# Patient Record
Sex: Female | Born: 1945 | Race: White | Hispanic: No | Marital: Married | Smoking: Never smoker
Health system: Southern US, Community
[De-identification: ages and names within clinical notes are randomized; demographics above are authoritative.]

## PROBLEM LIST (undated history)

## (undated) DIAGNOSIS — R002 Palpitations: Secondary | ICD-10-CM

## (undated) DIAGNOSIS — R51 Headache: Secondary | ICD-10-CM

## (undated) DIAGNOSIS — R011 Cardiac murmur, unspecified: Secondary | ICD-10-CM

## (undated) DIAGNOSIS — I10 Essential (primary) hypertension: Secondary | ICD-10-CM

## (undated) DIAGNOSIS — I779 Disorder of arteries and arterioles, unspecified: Secondary | ICD-10-CM

## (undated) DIAGNOSIS — Z9289 Personal history of other medical treatment: Secondary | ICD-10-CM

## (undated) DIAGNOSIS — K449 Diaphragmatic hernia without obstruction or gangrene: Secondary | ICD-10-CM

## (undated) DIAGNOSIS — I739 Peripheral vascular disease, unspecified: Secondary | ICD-10-CM

## (undated) DIAGNOSIS — I2510 Atherosclerotic heart disease of native coronary artery without angina pectoris: Secondary | ICD-10-CM

## (undated) DIAGNOSIS — I493 Ventricular premature depolarization: Secondary | ICD-10-CM

## (undated) DIAGNOSIS — I209 Angina pectoris, unspecified: Secondary | ICD-10-CM

## (undated) DIAGNOSIS — E785 Hyperlipidemia, unspecified: Secondary | ICD-10-CM

## (undated) DIAGNOSIS — N390 Urinary tract infection, site not specified: Secondary | ICD-10-CM

## (undated) DIAGNOSIS — E039 Hypothyroidism, unspecified: Secondary | ICD-10-CM

## (undated) DIAGNOSIS — R519 Headache, unspecified: Secondary | ICD-10-CM

## (undated) DIAGNOSIS — R0602 Shortness of breath: Secondary | ICD-10-CM

## (undated) DIAGNOSIS — M810 Age-related osteoporosis without current pathological fracture: Secondary | ICD-10-CM

## (undated) DIAGNOSIS — R251 Tremor, unspecified: Secondary | ICD-10-CM

## (undated) HISTORY — PX: BREAST CYST ASPIRATION: SHX578

## (undated) HISTORY — PX: ECTOPIC PREGNANCY SURGERY: SHX613

## (undated) HISTORY — DX: Atherosclerotic heart disease of native coronary artery without angina pectoris: I25.10

## (undated) HISTORY — DX: Diaphragmatic hernia without obstruction or gangrene: K44.9

## (undated) HISTORY — DX: Hyperlipidemia, unspecified: E78.5

## (undated) HISTORY — DX: Palpitations: R00.2

## (undated) HISTORY — PX: TOTAL ABDOMINAL HYSTERECTOMY: SHX209

## (undated) HISTORY — PX: CARDIAC CATHETERIZATION: SHX172

## (undated) HISTORY — DX: Essential (primary) hypertension: I10

## (undated) HISTORY — DX: Peripheral vascular disease, unspecified: I73.9

## (undated) HISTORY — DX: Ventricular premature depolarization: I49.3

## (undated) HISTORY — PX: APPENDECTOMY: SHX54

## (undated) HISTORY — DX: Hypothyroidism, unspecified: E03.9

## (undated) HISTORY — PX: TONSILLECTOMY: SUR1361

## (undated) HISTORY — DX: Disorder of arteries and arterioles, unspecified: I77.9

---

## 1963-07-27 HISTORY — PX: LAPAROTOMY: SHX154

## 1963-07-27 HISTORY — PX: BREAST CYST EXCISION: SHX579

## 1979-03-27 HISTORY — PX: BREAST LUMPECTOMY: SHX2

## 1998-02-27 ENCOUNTER — Ambulatory Visit (HOSPITAL_COMMUNITY): Admission: RE | Admit: 1998-02-27 | Discharge: 1998-02-27 | Payer: Self-pay | Admitting: *Deleted

## 1999-01-21 ENCOUNTER — Encounter: Payer: Self-pay | Admitting: *Deleted

## 1999-01-21 ENCOUNTER — Ambulatory Visit (HOSPITAL_COMMUNITY): Admission: RE | Admit: 1999-01-21 | Discharge: 1999-01-21 | Payer: Self-pay | Admitting: *Deleted

## 2000-02-16 ENCOUNTER — Encounter: Payer: Self-pay | Admitting: Family Medicine

## 2000-02-16 ENCOUNTER — Ambulatory Visit (HOSPITAL_COMMUNITY): Admission: RE | Admit: 2000-02-16 | Discharge: 2000-02-16 | Payer: Self-pay | Admitting: Family Medicine

## 2000-03-02 ENCOUNTER — Other Ambulatory Visit: Admission: RE | Admit: 2000-03-02 | Discharge: 2000-03-02 | Payer: Self-pay | Admitting: Gynecology

## 2001-02-20 ENCOUNTER — Encounter: Payer: Self-pay | Admitting: Gynecology

## 2001-02-20 ENCOUNTER — Ambulatory Visit (HOSPITAL_COMMUNITY): Admission: RE | Admit: 2001-02-20 | Discharge: 2001-02-20 | Payer: Self-pay | Admitting: Gynecology

## 2001-03-05 ENCOUNTER — Encounter: Payer: Self-pay | Admitting: Emergency Medicine

## 2001-03-05 ENCOUNTER — Emergency Department (HOSPITAL_COMMUNITY): Admission: EM | Admit: 2001-03-05 | Discharge: 2001-03-05 | Payer: Self-pay | Admitting: Emergency Medicine

## 2001-03-08 ENCOUNTER — Other Ambulatory Visit: Admission: RE | Admit: 2001-03-08 | Discharge: 2001-03-08 | Payer: Self-pay | Admitting: Gynecology

## 2002-03-13 ENCOUNTER — Other Ambulatory Visit: Admission: RE | Admit: 2002-03-13 | Discharge: 2002-03-13 | Payer: Self-pay | Admitting: Gynecology

## 2003-02-23 ENCOUNTER — Encounter: Payer: Self-pay | Admitting: Emergency Medicine

## 2003-02-23 ENCOUNTER — Inpatient Hospital Stay (HOSPITAL_COMMUNITY): Admission: EM | Admit: 2003-02-23 | Discharge: 2003-02-24 | Payer: Self-pay | Admitting: Emergency Medicine

## 2003-03-29 ENCOUNTER — Other Ambulatory Visit: Admission: RE | Admit: 2003-03-29 | Discharge: 2003-03-29 | Payer: Self-pay | Admitting: Gynecology

## 2004-05-11 ENCOUNTER — Other Ambulatory Visit: Admission: RE | Admit: 2004-05-11 | Discharge: 2004-05-11 | Payer: Self-pay | Admitting: Gynecology

## 2004-09-20 ENCOUNTER — Emergency Department (HOSPITAL_COMMUNITY): Admission: EM | Admit: 2004-09-20 | Discharge: 2004-09-20 | Payer: Self-pay | Admitting: Emergency Medicine

## 2004-11-17 ENCOUNTER — Ambulatory Visit (HOSPITAL_COMMUNITY): Admission: RE | Admit: 2004-11-17 | Discharge: 2004-11-17 | Payer: Self-pay | Admitting: Otolaryngology

## 2008-04-04 ENCOUNTER — Ambulatory Visit: Payer: Self-pay | Admitting: Cardiology

## 2008-04-04 ENCOUNTER — Observation Stay (HOSPITAL_COMMUNITY): Admission: EM | Admit: 2008-04-04 | Discharge: 2008-04-05 | Payer: Self-pay | Admitting: Emergency Medicine

## 2008-04-05 ENCOUNTER — Encounter (INDEPENDENT_AMBULATORY_CARE_PROVIDER_SITE_OTHER): Payer: Self-pay | Admitting: Internal Medicine

## 2008-04-08 ENCOUNTER — Ambulatory Visit: Payer: Self-pay | Admitting: Cardiology

## 2008-04-11 ENCOUNTER — Ambulatory Visit: Payer: Self-pay

## 2008-04-17 ENCOUNTER — Ambulatory Visit: Payer: Self-pay | Admitting: Cardiology

## 2010-04-18 ENCOUNTER — Emergency Department (HOSPITAL_BASED_OUTPATIENT_CLINIC_OR_DEPARTMENT_OTHER): Admission: EM | Admit: 2010-04-18 | Discharge: 2010-04-18 | Payer: Self-pay | Admitting: Emergency Medicine

## 2010-07-26 HISTORY — PX: CORONARY ANGIOPLASTY WITH STENT PLACEMENT: SHX49

## 2010-08-02 ENCOUNTER — Emergency Department (HOSPITAL_BASED_OUTPATIENT_CLINIC_OR_DEPARTMENT_OTHER)
Admission: EM | Admit: 2010-08-02 | Discharge: 2010-08-02 | Payer: Self-pay | Source: Home / Self Care | Admitting: Emergency Medicine

## 2010-08-03 ENCOUNTER — Inpatient Hospital Stay (HOSPITAL_COMMUNITY)
Admission: EM | Admit: 2010-08-03 | Discharge: 2010-08-05 | Payer: Self-pay | Source: Home / Self Care | Attending: Cardiology | Admitting: Cardiology

## 2010-08-03 ENCOUNTER — Encounter (INDEPENDENT_AMBULATORY_CARE_PROVIDER_SITE_OTHER): Payer: Self-pay | Admitting: Internal Medicine

## 2010-08-10 ENCOUNTER — Encounter: Payer: Self-pay | Admitting: Cardiology

## 2010-08-10 LAB — DIFFERENTIAL
Basophils Absolute: 0 10*3/uL (ref 0.0–0.1)
Basophils Absolute: 0.1 10*3/uL (ref 0.0–0.1)
Basophils Relative: 0 % (ref 0–1)
Basophils Relative: 0 % (ref 0–1)
Eosinophils Absolute: 0.3 10*3/uL (ref 0.0–0.7)
Eosinophils Absolute: 0.2 10*3/uL (ref 0.0–0.7)
Eosinophils Relative: 3 % (ref 0–5)
Eosinophils Relative: 2 % (ref 0–5)
Lymphocytes Relative: 42 % (ref 12–46)
Lymphocytes Relative: 22 % (ref 12–46)
Lymphs Abs: 4.6 10*3/uL — ABNORMAL HIGH (ref 0.7–4.0)
Lymphs Abs: 2.8 10*3/uL (ref 0.7–4.0)
Monocytes Absolute: 0.7 10*3/uL (ref 0.1–1.0)
Monocytes Absolute: 0.7 10*3/uL (ref 0.1–1.0)
Monocytes Relative: 6 % (ref 3–12)
Monocytes Relative: 6 % (ref 3–12)
Neutro Abs: 5.5 10*3/uL (ref 1.7–7.7)
Neutro Abs: 8.9 10*3/uL — ABNORMAL HIGH (ref 1.7–7.7)
Neutrophils Relative %: 49 % (ref 43–77)
Neutrophils Relative %: 71 % (ref 43–77)

## 2010-08-10 LAB — CBC
HCT: 41.3 % (ref 36.0–46.0)
HCT: 36.3 % (ref 36.0–46.0)
HCT: 34.1 % — ABNORMAL LOW (ref 36.0–46.0)
Hemoglobin: 13.7 g/dL (ref 12.0–15.0)
Hemoglobin: 11.8 g/dL — ABNORMAL LOW (ref 12.0–15.0)
Hemoglobin: 10.8 g/dL — ABNORMAL LOW (ref 12.0–15.0)
MCH: 28.1 pg (ref 26.0–34.0)
MCH: 29.1 pg (ref 26.0–34.0)
MCH: 28.1 pg (ref 26.0–34.0)
MCHC: 33.2 g/dL (ref 30.0–36.0)
MCHC: 32.5 g/dL (ref 30.0–36.0)
MCHC: 31.7 g/dL (ref 30.0–36.0)
MCV: 84.8 fL (ref 78.0–100.0)
MCV: 89.6 fL (ref 78.0–100.0)
MCV: 88.8 fL (ref 78.0–100.0)
Platelets: 309 10*3/uL (ref 150–400)
Platelets: 260 10*3/uL (ref 150–400)
Platelets: 211 10*3/uL (ref 150–400)
RBC: 4.87 MIL/uL (ref 3.87–5.11)
RBC: 4.05 MIL/uL (ref 3.87–5.11)
RBC: 3.84 MIL/uL — ABNORMAL LOW (ref 3.87–5.11)
RDW: 13.1 % (ref 11.5–15.5)
RDW: 13.3 % (ref 11.5–15.5)
RDW: 13 % (ref 11.5–15.5)
WBC: 11.1 10*3/uL — ABNORMAL HIGH (ref 4.0–10.5)
WBC: 12.6 10*3/uL — ABNORMAL HIGH (ref 4.0–10.5)
WBC: 8.9 10*3/uL (ref 4.0–10.5)

## 2010-08-10 LAB — BASIC METABOLIC PANEL
BUN: 8 mg/dL (ref 6–23)
BUN: 6 mg/dL (ref 6–23)
CO2: 27 mEq/L (ref 19–32)
CO2: 28 mEq/L (ref 19–32)
Calcium: 8.7 mg/dL (ref 8.4–10.5)
Calcium: 8.8 mg/dL (ref 8.4–10.5)
Chloride: 101 mEq/L (ref 96–112)
Chloride: 106 mEq/L (ref 96–112)
Creatinine, Ser: 0.74 mg/dL (ref 0.4–1.2)
Creatinine, Ser: 0.77 mg/dL (ref 0.4–1.2)
GFR calc Af Amer: >60 mL/min (ref >60)
GFR calc Af Amer: >60 mL/min (ref >60)
GFR calc non Af Amer: >60 mL/min (ref >60)
GFR calc non Af Amer: >60 mL/min (ref >60)
Glucose, Bld: 90 mg/dL (ref 70–99)
Glucose, Bld: 89 mg/dL (ref 70–99)
Potassium: 3.5 mEq/L (ref 3.5–5.1)
Potassium: 3.5 mEq/L (ref 3.5–5.1)
Sodium: 139 mEq/L (ref 135–145)
Sodium: 142 mEq/L (ref 135–145)

## 2010-08-10 LAB — CARDIAC PANEL(CRET KIN+CKTOT+MB+TROPI)
CK, MB: 0.9 ng/mL (ref 0.3–4.0)
CK, MB: 1 ng/mL (ref 0.3–4.0)
CK, MB: 1.1 ng/mL (ref 0.3–4.0)
CK, MB: 1.1 ng/mL (ref 0.3–4.0)
Relative Index: INVALID (ref 0.0–2.5)
Relative Index: INVALID (ref 0.0–2.5)
Relative Index: 1 (ref 0.0–2.5)
Relative Index: 1 (ref 0.0–2.5)
Total CK: 54 U/L (ref 7–177)
Total CK: 72 U/L (ref 7–177)
Total CK: 108 U/L (ref 7–177)
Total CK: 107 U/L (ref 7–177)
Troponin I: 0.02 ng/mL (ref 0.00–0.06)
Troponin I: 0.03 ng/mL (ref 0.00–0.06)
Troponin I: 0.01 ng/mL (ref 0.00–0.06)
Troponin I: 0.02 ng/mL (ref 0.00–0.06)

## 2010-08-10 LAB — COMPREHENSIVE METABOLIC PANEL
ALT: 39 U/L — ABNORMAL HIGH (ref 0–35)
AST: 45 U/L — ABNORMAL HIGH (ref 0–37)
Albumin: 4.9 g/dL (ref 3.5–5.2)
Alkaline Phosphatase: 136 U/L — ABNORMAL HIGH (ref 39–117)
BUN: 8 mg/dL (ref 6–23)
CO2: 26 mEq/L (ref 19–32)
Calcium: 9.9 mg/dL (ref 8.4–10.5)
Chloride: 101 mEq/L (ref 96–112)
Creatinine, Ser: 0.7 mg/dL (ref 0.4–1.2)
GFR calc Af Amer: >60 mL/min (ref >60)
GFR calc non Af Amer: >60 mL/min (ref >60)
Glucose, Bld: 120 mg/dL — ABNORMAL HIGH (ref 70–99)
Potassium: 3.7 mEq/L (ref 3.5–5.1)
Sodium: 143 mEq/L (ref 135–145)
Total Bilirubin: 1 mg/dL (ref 0.3–1.2)
Total Protein: 8.5 g/dL — ABNORMAL HIGH (ref 6.0–8.3)

## 2010-08-10 LAB — LIPASE, BLOOD: Lipase: 155 U/L (ref 23–300)

## 2010-08-10 LAB — MAGNESIUM: Magnesium: 2.1 mg/dL (ref 1.5–2.5)

## 2010-08-10 LAB — POCT CARDIAC MARKERS
CKMB, poc: <1 ng/mL — ABNORMAL LOW (ref 1.0–8.0)
Myoglobin, poc: 58.1 ng/mL (ref 12–200)
Troponin i, poc: <0.05 ng/mL (ref 0.00–0.09)

## 2010-08-10 LAB — LIPID PANEL
Cholesterol: 303 mg/dL — ABNORMAL HIGH (ref 0–200)
HDL: 43 mg/dL (ref >39)
LDL Cholesterol: 216 mg/dL — ABNORMAL HIGH (ref 0–99)
Total CHOL/HDL Ratio: 7 RATIO
Triglycerides: 220 mg/dL — ABNORMAL HIGH (ref <150)
VLDL: 44 mg/dL — ABNORMAL HIGH (ref 0–40)

## 2010-08-10 LAB — PROTIME-INR
INR: 1.01 (ref 0.00–1.49)
Prothrombin Time: 13.5 seconds (ref 11.6–15.2)

## 2010-08-10 LAB — HEMOGLOBIN A1C
Hgb A1c MFr Bld: 5.6 % (ref <5.7)
Mean Plasma Glucose: 114 mg/dL (ref <117)

## 2010-08-10 LAB — APTT: aPTT: 30 seconds (ref 24–37)

## 2010-08-12 ENCOUNTER — Encounter: Payer: Self-pay | Admitting: Cardiology

## 2010-08-12 ENCOUNTER — Telehealth: Payer: Self-pay | Admitting: Cardiology

## 2010-08-12 ENCOUNTER — Ambulatory Visit
Admission: RE | Admit: 2010-08-12 | Discharge: 2010-08-12 | Payer: Self-pay | Source: Home / Self Care | Attending: Cardiology | Admitting: Cardiology

## 2010-08-14 DIAGNOSIS — E039 Hypothyroidism, unspecified: Secondary | ICD-10-CM | POA: Insufficient documentation

## 2010-08-14 DIAGNOSIS — I10 Essential (primary) hypertension: Secondary | ICD-10-CM | POA: Insufficient documentation

## 2010-08-14 DIAGNOSIS — I4949 Other premature depolarization: Secondary | ICD-10-CM | POA: Insufficient documentation

## 2010-08-14 DIAGNOSIS — E785 Hyperlipidemia, unspecified: Secondary | ICD-10-CM | POA: Insufficient documentation

## 2010-08-14 DIAGNOSIS — I2510 Atherosclerotic heart disease of native coronary artery without angina pectoris: Secondary | ICD-10-CM | POA: Insufficient documentation

## 2010-08-14 DIAGNOSIS — K449 Diaphragmatic hernia without obstruction or gangrene: Secondary | ICD-10-CM | POA: Insufficient documentation

## 2010-08-17 ENCOUNTER — Ambulatory Visit
Admission: RE | Admit: 2010-08-17 | Discharge: 2010-08-17 | Payer: Self-pay | Source: Home / Self Care | Attending: Cardiology | Admitting: Cardiology

## 2010-08-17 ENCOUNTER — Encounter: Payer: Self-pay | Admitting: Cardiology

## 2010-08-19 ENCOUNTER — Encounter: Payer: Self-pay | Admitting: Cardiology

## 2010-08-22 ENCOUNTER — Inpatient Hospital Stay (HOSPITAL_COMMUNITY)
Admission: EM | Admit: 2010-08-22 | Discharge: 2010-08-25 | Disposition: A | Discharge status: Home / Self Care | Payer: Self-pay | Source: Home / Self Care | Attending: Cardiology | Admitting: Cardiology

## 2010-08-22 LAB — POCT I-STAT, CHEM 8
BUN: 10 mg/dL (ref 6–23)
Calcium, Ion: 1.09 mmol/L — ABNORMAL LOW (ref 1.12–1.32)
Chloride: 103 mEq/L (ref 96–112)
Creatinine, Ser: 0.8 mg/dL (ref 0.4–1.2)
Glucose, Bld: 115 mg/dL — ABNORMAL HIGH (ref 70–99)
HCT: 36 % (ref 36.0–46.0)
Hemoglobin: 12.2 g/dL (ref 12.0–15.0)
Potassium: 4 mEq/L (ref 3.5–5.1)
Sodium: 139 mEq/L (ref 135–145)
TCO2: 30 mmol/L (ref 0–100)

## 2010-08-22 LAB — DIFFERENTIAL
Basophils Absolute: 0 10*3/uL (ref 0.0–0.1)
Basophils Relative: 0 % (ref 0–1)
Eosinophils Absolute: 0.3 10*3/uL (ref 0.0–0.7)
Eosinophils Relative: 3 % (ref 0–5)
Lymphocytes Relative: 20 % (ref 12–46)
Lymphs Abs: 2.2 10*3/uL (ref 0.7–4.0)
Monocytes Absolute: 0.7 10*3/uL (ref 0.1–1.0)
Monocytes Relative: 7 % (ref 3–12)
Neutro Abs: 7.4 10*3/uL (ref 1.7–7.7)
Neutrophils Relative %: 70 % (ref 43–77)

## 2010-08-22 LAB — CBC
HCT: 36.8 % (ref 36.0–46.0)
Hemoglobin: 12.1 g/dL (ref 12.0–15.0)
MCH: 28.8 pg (ref 26.0–34.0)
MCHC: 32.9 g/dL (ref 30.0–36.0)
MCV: 87.6 fL (ref 78.0–100.0)
Platelets: 305 10*3/uL (ref 150–400)
RBC: 4.2 MIL/uL (ref 3.87–5.11)
RDW: 13.1 % (ref 11.5–15.5)
WBC: 10.6 10*3/uL — ABNORMAL HIGH (ref 4.0–10.5)

## 2010-08-22 LAB — POCT CARDIAC MARKERS
CKMB, poc: <1 ng/mL — ABNORMAL LOW (ref 1.0–8.0)
Myoglobin, poc: 60 ng/mL (ref 12–200)
Troponin i, poc: <0.05 ng/mL (ref 0.00–0.09)

## 2010-08-22 LAB — BRAIN NATRIURETIC PEPTIDE: Pro B Natriuretic peptide (BNP): 123 pg/mL — ABNORMAL HIGH (ref 0.0–100.0)

## 2010-08-22 LAB — PROTIME-INR: INR: 0.93 (ref 0.00–1.49)

## 2010-08-23 LAB — LIPID PANEL
Cholesterol: 254 mg/dL — ABNORMAL HIGH (ref 0–200)
HDL: 44 mg/dL (ref >39)
LDL Cholesterol: 161 mg/dL — ABNORMAL HIGH (ref 0–99)
Total CHOL/HDL Ratio: 5.8 RATIO
Triglycerides: 245 mg/dL — ABNORMAL HIGH (ref <150)
VLDL: 49 mg/dL — ABNORMAL HIGH (ref 0–40)

## 2010-08-23 LAB — CARDIAC PANEL(CRET KIN+CKTOT+MB+TROPI)
CK, MB: 0.6 ng/mL (ref 0.3–4.0)
Relative Index: INVALID (ref 0.0–2.5)
Relative Index: INVALID (ref 0.0–2.5)
Total CK: 34 U/L (ref 7–177)
Troponin I: 0.01 ng/mL (ref 0.00–0.06)

## 2010-08-24 LAB — BASIC METABOLIC PANEL
BUN: 10 mg/dL (ref 6–23)
Chloride: 101 mEq/L (ref 96–112)
Creatinine, Ser: 0.74 mg/dL (ref 0.4–1.2)
GFR calc Af Amer: >60 mL/min (ref >60)
GFR calc non Af Amer: >60 mL/min (ref >60)
Potassium: 3.5 mEq/L (ref 3.5–5.1)

## 2010-08-24 LAB — CBC
MCV: 88.3 fL (ref 78.0–100.0)
Platelets: 282 10*3/uL (ref 150–400)
RBC: 4.46 MIL/uL (ref 3.87–5.11)
RDW: 13.2 % (ref 11.5–15.5)
WBC: 9.4 10*3/uL (ref 4.0–10.5)

## 2010-08-24 LAB — PROTIME-INR
INR: 0.99 (ref 0.00–1.49)
Prothrombin Time: 13.3 seconds (ref 11.6–15.2)

## 2010-08-24 LAB — HEMOGLOBIN A1C: Mean Plasma Glucose: 114 mg/dL (ref <117)

## 2010-08-25 LAB — POCT I-STAT 3, ART BLOOD GAS (G3+)
Bicarbonate: 37.4 mEq/L — ABNORMAL HIGH (ref 20.0–24.0)
TCO2: 39 mmol/L (ref 0–100)
pH, Arterial: 7.357 (ref 7.350–7.400)
pO2, Arterial: 76 mmHg — ABNORMAL LOW (ref 80.0–100.0)

## 2010-08-25 LAB — CBC
HCT: 38.6 % (ref 36.0–46.0)
MCHC: 31.9 g/dL (ref 30.0–36.0)
MCV: 88.7 fL (ref 78.0–100.0)
Platelets: 270 10*3/uL (ref 150–400)
RDW: 13.2 % (ref 11.5–15.5)

## 2010-08-25 LAB — BASIC METABOLIC PANEL
BUN: 11 mg/dL (ref 6–23)
Calcium: 9.2 mg/dL (ref 8.4–10.5)
GFR calc non Af Amer: >60 mL/min (ref >60)
Glucose, Bld: 91 mg/dL (ref 70–99)

## 2010-08-26 NOTE — H&P (Signed)
[name], [name]               ACCOUNT NO.:  [id number]  MEDICAL RECORD NO.:  [id number]          PATIENT TYPE:  INP  LOCATION:  4739                         FACILITY:  [facility]  PHYSICIAN:  Cassell Clement, M.D. DATE OF BIRTH:  11-Apr-1946  DATE OF ADMISSION:  08/22/2010 DATE OF DISCHARGE:                             HISTORY & PHYSICAL   PRIMARY CARDIOLOGIST:  Maisie Fus C. Wall, MD, [facility]  CHIEF COMPLAINT:  Chest pain.  HISTORY:  This is a pleasant 65 year old married Caucasian female who is admitted to the emergency room on August 22, 2010, with recurrent chest pain.  She was recently hospitalized from August 03, 2010, through August 05, 2010, with chest pain.  She underwent cardiac catheterization on August 04, 2010, and had findings of a 50% mid vessel stenosis in a branch of the right coronary artery, the LAD system had only luminal irregularities, and the left circumflex consisted mainly of a large obtuse marginal, which had a 95% proximal stenosis. The cardiac catheterization was done by Dr. Shirlee Latch.  The patient went on to have percutaneous intervention by Dr. Verne Carrow.  She underwent placement of a drug-eluting stent in the first obtuse marginal branch of the circumflex artery.  She tolerated the procedure well and was discharged the following day.  DISCHARGE MEDICATIONS: 1. Pravastatin 40 mg one daily. 2. Aspirin 81 mg one daily. 3. Metoprolol 12.5 mg twice a day. 4. Nitroglycerin sublingual tablet p.r.n. 5. Prasugrel 10 mg one daily. 6. Caltrate 1 tablet twice daily. 7. Diovan HCT 160/12.5 one daily. 8. Multivitamin one daily. 9. Nexium 40 mg one daily. 10.Tylenol Extra Strength 500 mg 2 every 6 hours p.r.n. for headache. 11.Unithroid 50 mcg one daily. 12.Vitamin D3 over the counter 1 daily.  She was seen in followup by Dr. Juanito Doom on August 17, 2010, and was felt to be doing well.  Early this morning, the patient awoke with substernal chest  discomfort. She took a total of 3 nitroglycerin with eventual relief.  She did well until the afternoon of admission when she developed slight chest discomfort associated with weakness, nausea, and sweating.  The family called EMS.  EMS gave her an additional sublingual nitroglycerin and an additional aspirin, and the patient has had no further discomfort but is anxious.  On presentation in the emergency room, her PO2 on room air was slightly low at 76.  Her chest x-ray was normal and her EKG was nonacute.  PAST MEDICAL HISTORY:  Positive for hypertension, hyperlipidemia, hiatal hernia, history of PVCs, hypothyroidism, and osteoporosis.  SURGICAL HISTORY:  Status post hysterectomy, status post tonsillectomy, status post appendectomy.  The patient is allergic to CODEINE, FLAGYL, PENICILLIN, and has been intolerant of STATINS, although at the present time, we are trying her on a low dose of pravastatin.  The family history reveals that her brother had a heart attack at age 39.  The social history reveals that she does not use alcohol or tobacco.  REVIEW OF SYSTEMS:  All other systems are negative in detail.  PHYSICAL EXAMINATION:  VITAL SIGNS:  Her blood pressure is 162/80, pulse is 70 and regular, PO2 is  76, respirations are normal, and she is afebrile. GENERAL APPEARANCE:  An anxious elderly Caucasian female in no acute distress. HEAD AND NECK:  Jugular venous pressure normal.  Carotids normal. Thyroid normal.  Pupils equal and reactive.  Extraocular movements are full. NECK:  Supple.  There is no lymphadenopathy.  There is no thyroid enlargement. CHEST:  Clear to percussion and auscultation. HEART:  A grade 2/6 systolic ejection murmur at the base. ABDOMEN:  Soft and nontender.  The liver and spleen are not enlarged. EXTREMITIES:  Mild resolving ecchymosis of the right groin.  There is no evidence of phlebitis or edema.  Pedal pulses are good. NEUROLOGIC:   Physiologic.  Her white count is 10,600, hemoglobin is 12.1, she has 70% neutrophils, platelet count 305,000.  Sodium 139, potassium 4.0, BUN 10, creatinine 0.8, blood sugar 115.  Protime 12.7.  Arterial gases show a pH of 7.357, PCO2 of 66, PO2 of 76.  Her CK-MB is less than 1.0.  Troponin is less than 0.05.  Her chest x-ray shows clear lung fields.  Cardiomegaly is stable.  There is no acute bony abnormality.  EKG is nonacute and she is in normal sinus rhythm.  IMPRESSION: 1. Recurrent chest pain, possible angina pectoris, rule out other     causes. 2. Status post percutaneous transluminal coronary angioplasty with     drug-eluting stent of the first obtuse marginal on August 04, 2010, by Dr. Clifton James. 3. History of hypertensive cardiovascular disease. 4. History of hyperlipidemia. 5. History of hypothyroidism.  DISPOSITION:  She is being admitted to telemetry.  We will cycle enzymes.  We will continue her on her Nexium.  We will increase her dose of beta-blocker to metoprolol 25 mg one b.i.d.  We will check a D-dimer tonight and if elevated, we will consider CT angiogram of the chest.          ______________________________ Cassell Clement, M.D.     TB/MEDQ  D:  08/22/2010  T:  08/23/2010  Job:  [id number]  Electronically Signed by Cassell Clement M.D. on 08/26/2010 12:56:48 PM

## 2010-08-26 NOTE — Discharge Summary (Addendum)
[name], [name]               ACCOUNT NO.:  [id number]  MEDICAL RECORD NO.:  [id number]          PATIENT TYPE:  INP  LOCATION:  4739                         FACILITY:  [facility]  PHYSICIAN:  Precious Gilchrest C. Mateusz Neilan, MD, FACCDATE OF BIRTH:  08-20-1945  DATE OF ADMISSION:  08/22/2010 DATE OF DISCHARGE:  08/25/2010                              DISCHARGE SUMMARY   DISCHARGE DIAGNOSES: 1. Chest pain, felt noncardiac.     a.     Relook catheterization, August 24, 2010, demonstrating      widely patent stent with no other obstructive disease. 2. Coronary artery disease, status post percutaneous coronary     intervention with placement of drug-eluting stent to the first     obtuse marginal on August 04, 2010. 3. Hypertension. 4. Hyperlipidemia with total cholesterol of 254, triglycerides 245,     HDL 44, LDL 161 on August 23, 2010, changed to Crestor.  The     patient will have lipids and LFTs checked in approximately 6 weeks. 5. Hypothyroidism. 6. Hiatal hernia. 7. History of premature ventricular contractions.  HOSPITAL COURSE:  [name] is a 65 year old female with a past medical history of coronary artery disease, status post stent placement to the OM on August 04, 2010, hypertension, hyperlipidemia, and hypothyroidism who awoke with substernal chest discomfort.  She took three nitroglycerin with eventual relief.  She developed associated weakness, nausea, and sweating.  Family called EMS, and she was given sublingual nitro and additional aspirin.  On arrival to the ER, she was found to be anxious.  EKG was nonacute and chest x-ray was normal.  She was admitted to the hospital given her history of recent stent placement and cardiac enzymes were cycled, which were negative.  She was continued on beta blockade and PPI.  D-dimer was checked which was negative.  She went for relook catheterization on August 24, 2010, which showed that her stent was widely patent and Dr. Eden Emms felt that  her chest pain was noncardiac in etiology.  Her EF was 65%.  She was observed overnight and did well. Dr. Daleen Squibb has seen and examined her this morning and feels she is stable for discharge.  He would like to switch her pravastatin to Crestor given her elevated cholesterol and to continue her beta blockade as well.  Of note, the pharmacist has recommended given the patient's weight of 50 kg to decrease the dose of prasugrel to 5 mg.  I have discussed this with Dr. Daleen Squibb and Dr. Verne Carrow, interventionalist.  Dr. Clifton James recommended to continue the patient on 10 mg daily given the lack of evidence of 5 mg as effective in the setting of her recent stent placement.  There is no evidence of bleeding at this time and her hemoglobin is stable.  The patient has been educated on this decision well.  DISCHARGE LABORATORY DATA:  WBC 9.1, hemoglobin 12.3, hematocrit 38.6, platelet count 270.  Sodium 140, potassium 3.4 which has been repleted with 40 mEq of potassium, chloride 102, CO2 of 27, glucose 91, BUN 7, creatinine 0.77.  Hemoglobin A1c of 5.6.  Cardiac enzymes were negative. BNP 118.  Total cholesterol as above.  STUDIES: 1. Chest x-ray, August 22, 2010, showed stable mild cardiomegaly     without active lung disease. 2. Cardiac catheterization, August 24, 2010, please see that report     for full details as well as HPI for summary.  DISCHARGE MEDICATIONS: 1. Crestor 20 mg nightly. 2. Aspirin 81 mg daily. 3. Calcium carbonate/vitamin D over the counter 1 tablet twice daily. 4. Diovan HCT 160/12.5 mg daily. 5. Effient 10 mg daily. 6. Metoprolol tartrate 25 mg half tablet b.i.d. 7. Multivitamin 1 tablet daily. 8. Nexium 40 mg every morning. 9. Nitroglycerin sublingual 0.4 mg every 5 minutes as needed up to 3     doses for chest pain. 10.Tylenol Extra Strength 500 mg 2 tablets q.6 h. p.r.n. 11.Unithroid 50 mcg daily. 12.Vitamin D3 1 tablet daily.  Please note that the  patient's pravastatin was changed to Crestor this admission for her unsatisfactory lipid profile.  There is a history of myalgias with statin, so we will monitor closely while on Crestor and follow up her lipid panel as well as LFTs.  DISPOSITION:  [name] was discharged in stable condition home.  She is not to lift or participate in sexual activity for 1 week or drive for 2 days.  She is to follow a low-sodium heart-healthy diet; and if she notices any pain, swelling, bleeding, or pus at her cath site, she is to call or return.  She will follow up with Dr. Vern Claude office for labs including lipid panel and LFTs on September 28, 2010, at 8:55 a.m. as previously scheduled.  The office will call her with reminders and to schedule her 89-month followup appointment as Dr. Daleen Squibb does not feel that she needs to be seen in the interim.  She is to return to the ER if she develops any recurrent chest pain, shortness of breath, or any other symptoms concerning to her.  Also please see above for Effient discussion.  DURATION OF DISCHARGE ENCOUNTER:  Greater than 30 minutes including physician and PA time.     Dayna Dunn, P.A.C.   ______________________________ Jesse Sans Daleen Squibb, MD, [facility]    DD/MEDQ  D:  08/25/2010  T:  08/26/2010  Job:  [id number]  Electronically Signed by Ronie Spies  on 08/26/2010 09:32:34 PM Electronically Signed by Valera Castle MD [facility] on 09/09/2010 09:36:11 AM

## 2010-08-27 NOTE — Procedures (Signed)
  NAMEGYSELLE, Nicole               ACCOUNT NO.:  [id number]  MEDICAL RECORD NO.:  [id number]           PATIENT TYPE:  LOCATION:                                 FACILITY:  PHYSICIAN:  Malori Myers C. Eden Emms, MD, FACCDATE OF BIRTH:  [birth year]  DATE OF PROCEDURE: DATE OF DISCHARGE:                           CARDIAC CATHETERIZATION   This is a 65 year old patient who had a recent stent to the first obtuse marginal branch, recurrent admissions for chest pain.  A catheterization was done from the right radial artery.  The patient received 25 mcg of fentanyl, 3 mg of Versed, 3 mg of verapamil, and 200 mcg of nitro.  Standard JL-3.5 and JR-4 catheters were used to engage the coronaries.  Left main coronary artery was normal.  Left anterior descending artery was normal.  First, second, and third diagonal branches were small and normal.  Circumflex coronary artery was normal.  First obtuse marginal branch had a widely patent stent in the midvessel. There was 30-40% disease distal to the stent.  Right coronary artery was dominant.  There were 30% multiple discrete lesions in the mid and distal RCA and PDA was normal.  First posterolateral branch had a 50% ostial lesion, but it was a very small vessel, less than 2 mm.  RAO ventriculography:  RAO ventriculography was normal with EF 65%. There was no gradient across the aortic valve and no MR.  Aortic pressure is 142/70.  LV pressure is 140/5.  IMPRESSION:  The patient's stent is widely patent.  Her recurrent chest pain would appear to be noncardiac.  She tolerated the procedure well. At the end of the case, a TR band was applied with good hemostasis.  Since it is late in the day, she will likely stay overnight and be discharged in the morning so long as her hand looks good.    Noralyn Pick. Eden Emms, MD, [facility]    PCN/MEDQ  D:  08/24/2010  T:  08/25/2010  Job:  [id number]  Electronically Signed by Charlton Haws MD [facility] on 08/27/2010 10:38:44  PM

## 2010-08-27 NOTE — Letter (Signed)
Summary: Cardiac Rehab Program  Cardiac Rehab Program   Imported By: Marylou Mccoy 08/17/2010 17:12:10  _____________________________________________________________________  External Attachment:    Type:   Image     Comment:   External Document

## 2010-08-27 NOTE — Assessment & Plan Note (Signed)
Summary: EPH PER NIKKI PA-MB   Visit Type:  EPH Primary Provider:  Dr. Tenny Craw.... [facility]  CC:  tender slightly below cath site...no other complaints today.  History of Present Illness: [name] returns today for evaluation and management of coronary disease.  [name] was admitted to the hospital with chest pain. Cardiac catheter showed a tight large OM1 stenosis and a 50% posterior lateral branch of the right coronary artery. LV function was normal. [name] had drug-eluting stent placed to the OM1.  [name]'s had no recurrent angina. [name] is carrying nitroglycerin.  [name] didn't tolerate statins in the past. We started on pravastatin 40 mg hoping that [name] could take this. [name] started having muscle cramps at night and took it upon herself to go to 40 mg every other day. We have not obtain followup blood work since starting this drug.  [name] is very compliant otherwise the medications. [name] has some bruising in her right calf site but no pain and no not. [name] has good pulses.  Clinical Reports Reviewed:  Cardiac Cath:  08/05/2010: Cardiac Cath Findings:   IMPRESSION:  Successful percutaneous transluminal coronary angioplasty   with placement of a drug-eluting stent in the first obtuse marginal   branch of the circumflex artery.      RECOMMENDATIONS:  The patient should be continued on aspirin and Effient   for at least 1 year.  We will continue her beta-blocker.  [name] had   adverse reaction in the past to Lipitor and Zocor, however, Dr. Daleen Winters   plans to try starting her on pravastatin.               Nicole Carrow, MD      08/04/2010: Cardiac Cath Findings:   FINDINGS:   1. Hemodynamics:  Aorta 152/75, LV 146/16.   2. Left ventriculography:  EF was estimated at 60%.  There were no       regional [name] motion abnormalities in the RAO projection.   3. Right coronary artery:  The right coronary was dominant vessel.       There were luminal irregularities in the body of the RCA.  There      was a large PLV that had about 50% midvessel stenosis.   4. Left main:  Left main had no angiographic disease.   5. Left circumflex system:  Left circumflex system mainly consisted of       a large obtuse marginal.  This obtuse marginal had a 95% proximal       stenosis.   6. LAD system:  The LAD system had only luminal irregularities.      IMPRESSION:  This is a 65 year old who presented with symptoms   consistent with unstable angina, was found to have a 95% stenosis in the   large obtuse marginal, this is likely the culprit for unstable angina.   We will plan percutaneous intervention today.  Discussed with Dr.   Clifton Winters.               Nicole Ancona, MD    Current Medications (verified): 1)  Pravastatin Sodium 40 Mg Tabs (Pravastatin Sodium) .Marland Kitchen.. 1 Tab At Bedtime 2)  Aspirin 81 Mg Tbec (Aspirin) .... Take One Tablet By Mouth Daily 3)  Metoprolol Tartrate 25 Mg Tabs (Metoprolol Tartrate) .... 1/2 Tab Two Times A Day 4)  Nitrostat 0.4 Mg Subl (Nitroglycerin) .Marland Kitchen.. 1 Tablet Under Tongue At Onset of Chest Pain; You May Repeat Every 5 Minutes For Up To 3 Doses. 5)  Effient 10 Mg Tabs (Prasugrel Hcl) .Marland Kitchen.. 1 Tab Once Daily 6)  Caltrate 600 1500 Mg Tabs (Calcium Carbonate) .Marland Kitchen.. 1 Tab Two Times A Day 7)  Diovan Hct 160-12.5 Mg Tabs (Valsartan-Hydrochlorothiazide) .Marland Kitchen.. 1 Tab Once Daily 8)  Multivitamins   Tabs (Multiple Vitamin) .Marland Kitchen.. 1 Tab Once Daily 9)  Nexium 40 Mg Cpdr (Esomeprazole Magnesium) .Marland Kitchen.. 1 Cap Once Daily 10)  Unithroid 50 Mcg Tabs (Levothyroxine Sodium) .Marland Kitchen.. 1 Tab Once Daily 11)  Vitamin D3 1000 Unit Caps (Cholecalciferol) .Marland Kitchen.. 1 Cap Once Daily 12)  Tylenol Extra Strength 500 Mg Tabs (Acetaminophen) .... 2 Tabs Q 6 Hour As Needed  Allergies: 1)  ! Lipitor 2)  ! Zocor 3)  ! Codeine 4)  ! Flagyl 5)  ! Penicillin  Past History:  Past Medical History: Last updated: 08/14/2010 PREMATURE VENTRICULAR CONTRACTIONS (ICD-427.69) CAD, NATIVE VESSEL  (ICD-414.01) HYPERLIPIDEMIA (ICD-272.4) HYPERTENSION (ICD-401.9) HIATAL HERNIA (ICD-553.3) HYPOTHYROIDISM (ICD-244.9)    Past Surgical History: Last updated: 08/14/2010 Status post hysterectomy,  status post tonsillectomy,  status post appendectomy.   Family History: Last updated: 08/14/2010  Brother with an MI at 93.  Social History: Last updated: 08/14/2010 He does not smoke, does not drink.  Drug Use - no  Review of Systems       negative other than history of present illness  Vital Signs:  Patient profile:   65 year old female Height:      63 inches Weight:      132.50 pounds BMI:     23.56 Pulse rate:   66 / minute Pulse rhythm:   irregular BP sitting:   144 / 72  (left arm) Cuff size:   large  Vitals Entered By: [name], CMA (August 17, 2010 11:50 AM)  Physical Exam  General:  Well developed, well nourished, in no acute distress. Head:  normocephalic and atraumatic Eyes:  PERRLA/EOM intact; conjunctiva and lids normal. Neck:  Neck supple, no JVD. No masses, thyromegaly or abnormal cervical nodes. Chest [name]:  no deformities or breast masses noted Lungs:  Clear bilaterally to auscultation and percussion. Heart:  PMI nondisplaced, regular rate and rhythm, normal S1-S2, no murmur, no carotid bruits Msk:  Back normal, normal gait. Muscle strength and tone normal. Pulses:  pulses normal in all 4 extremities Extremities:  No clubbing or cyanosis. Neurologic:  Alert and oriented x 3. Skin:  Intact without lesions or rashes. Psych:  Normal affect.   Impression & Recommendations:  Problem # 1:  CAD, NATIVE VESSEL (ICD-414.01) Assessment Improved  Her updated medication list for this problem includes:    Aspirin 81 Mg Tbec (Aspirin) .Marland Kitchen... Take one tablet by mouth daily    Metoprolol Tartrate 25 Mg Tabs (Metoprolol tartrate) .Marland Kitchen... 1/2 tab two times a day    Nitrostat 0.4 Mg Subl (Nitroglycerin) .Marland Kitchen... 1 tablet under tongue at onset of chest pain; you  may repeat every 5 minutes for up to 3 doses.    Effient 10 Mg Tabs (Prasugrel hcl) .Marland Kitchen... 1 tab once daily  Orders: EKG w/ Interpretation (93000)  Problem # 2:  HYPERLIPIDEMIA (ICD-272.4) Assessment: Deteriorated Advised patient to take 20 mg every day and check blood work in 6 weeks. Have tried to convince her her cramps at night and her legs are not pravastatin. Her updated medication list for this problem includes:    Pravastatin Sodium 20 Mg Tabs (Pravastatin sodium) .Marland Kitchen... Take one tablet by mouth daily at bedtime  Problem # 3:  HYPERTENSION (ICD-401.9) Assessment: Improved  Her  updated medication list for this problem includes:    Aspirin 81 Mg Tbec (Aspirin) .Marland Kitchen... Take one tablet by mouth daily    Metoprolol Tartrate 25 Mg Tabs (Metoprolol tartrate) .Marland Kitchen... 1/2 tab two times a day    Diovan Hct 160-12.5 Mg Tabs (Valsartan-hydrochlorothiazide) .Marland Kitchen... 1 tab once daily  Patient Instructions: 1)  Your physician recommends that you schedule a follow-up appointment in: 5 months 2)  Your physician recommends that you return for a FASTING lipid profile, liver profile and bmp in 6 weeks-272.0 3)  Your physician has recommended you make the following change in your medication: Decrease pravastatin to 20 mg by mouth daily. Prescriptions: PRAVASTATIN SODIUM 20 MG TABS (PRAVASTATIN SODIUM) Take one tablet by mouth daily at bedtime  #30 x 6   Entered by:   Dossie Arbour, RN, BSN   Authorized by:   Gaylord Shih, MD, [facility]   Signed by:   Dossie Arbour, RN, BSN on 08/17/2010   Method used:   Electronically to        [facility]  [place] [address] (retail)       [address] [place] [address]       [place], [place]  [address]       Ph: [phone] or [phone]       Fax: [phone]   RxID:   [id number]

## 2010-08-27 NOTE — Assessment & Plan Note (Signed)
Summary: bpcheck/compare with her machine/dfg  Nurse Visit

## 2010-08-27 NOTE — Progress Notes (Signed)
Summary: pt had episode last night-told to fu with [place] today  Phone Note Call from Patient   Caller: Patient [phone] or [phone] Reason for Call: Talk to Nurse Summary of Call: after pt took med last night her heart started pounding and bp went up-called the dr on call about 9p-dr brackbill and he told her to take another half tab of her lopressor, felt better until about 2a when it started again but not to the point where she had to take another dose, dr Patty Sermons told her to follow up with [place] today-pls advise Initial call taken by: Glynda Jaeger,  August 12, 2010 8:45 AM  Follow-up for Phone Call        [name] was experiencing " pounding heart and bp" x 2 late last pm.  She talked with md on call and took extra 1/2 metoprolol x 2 with relief at that time.  Her bp was 160/97 & 190/112. No chest pain, angina  at that time.  She did report a few palpitations This am she experienced palpitations before taking her metoprolol.  Her blood pressure currently is 139/106 HR 93.  This being 1 hour after her medication.  She will come in for a bp check this morning and bring her bp cuff.  She does note that she has been nervous and tense after leaving the hospital. Mylo Red RN     Appended Document: pt had episode last night-told to fu with [place] today saw in office....NSR

## 2010-09-03 NOTE — Procedures (Signed)
  [name], [name]               ACCOUNT NO.:  [id number]  MEDICAL RECORD NO.:  [id number]          PATIENT TYPE:  INP  LOCATION:  6531                         FACILITY:  [facility]  PHYSICIAN:  Marca Ancona, MD      DATE OF BIRTH:  04-25-1946  DATE OF PROCEDURE: DATE OF DISCHARGE:                           CARDIAC CATHETERIZATION   PROCEDURES: 1. Left heart catheterization. 2. Coronary angiography. 3. Left ventriculography.  INDICATIONS:  This is a 65 year old with history of hypertension who presented with symptoms consistent with unstable angina.  PROCEDURE NOTE:  After informed consent was obtained, the right groin was sterilely prepped and draped.  Lidocaine 1% was used to locally anesthetize the right groin area.  The right common femoral artery was entered using modified Seldinger technique and a 5-French arterial sheath was placed.  The left coronary artery was engaged in MP catheter. The right coronary artery was engaged using the MP catheter.  Left ventricle was entered using angled pigtail catheter.  There are no complications.  FINDINGS: 1. Hemodynamics:  Aorta 152/75, LV 146/16. 2. Left ventriculography:  EF was estimated at 60%.  There were no     regional wall motion abnormalities in the RAO projection. 3. Right coronary artery:  The right coronary was dominant vessel.     There were luminal irregularities in the body of the RCA.  There     was a large PLV that had about 50% midvessel stenosis. 4. Left main:  Left main had no angiographic disease. 5. Left circumflex system:  Left circumflex system mainly consisted of     a large obtuse marginal.  This obtuse marginal had a 95% proximal     stenosis. 6. LAD system:  The LAD system had only luminal irregularities.  IMPRESSION:  This is a 64 year old who presented with symptoms consistent with unstable angina, was found to have a 95% stenosis in the large obtuse marginal, this is likely the culprit for unstable  angina. We will plan percutaneous intervention today.  Discussed with Dr. Clifton James.     Marca Ancona, MD     DM/MEDQ  D:  08/04/2010  T:  08/05/2010  Job:  [id number]  cc:   Dr. Radene Journey  Electronically Signed by Marca Ancona MD on 09/03/2010 08:29:41 AM

## 2010-09-16 NOTE — Miscellaneous (Signed)
Summary: [facility]   [facility]   Imported By: Roderic Ovens 09/09/2010 14:19:23  _____________________________________________________________________  External Attachment:    Type:   Image     Comment:   External Document

## 2010-09-18 NOTE — H&P (Addendum)
[name], [name]               ACCOUNT NO.:  [id number]  MEDICAL RECORD NO.:  [id number]          PATIENT TYPE:  INP  LOCATION:  4735                         FACILITY:  [facility]  PHYSICIAN:  Nicole Marker, MD DATE OF BIRTH:  [birth year]  DATE OF ADMISSION:  08/03/2010 DATE OF DISCHARGE:                             HISTORY & PHYSICAL   CHIEF COMPLAINT:  Chest pressure.  HISTORY AND PHYSICAL:  This is a 65 year old female with a history of hypertension, hyperlipidemia, hiatal hernia, and early family history of myocardial infarction who presents with 24 hours of intermittent resting chest pain.  The patient was in her normal state of health until one night before admission when she developed a substernal chest pressure. The pain was a pressure that was about a 7-8/10 in quality.  There was no radiation and no associated symptoms.  The patient thought that his symptoms were consistent with her reflux from her hiatal hernia.  She took Mylanta and Gas-X without any relief.  This pain lasted several minutes and essentially occurred more than 5 times, each time the patient took antacids, which did not help.  Tonight at about 9 p.m., she redeveloped this substernal chest pressure that lasted over an hour, again her antacids were not effective.  She reports that because of chest pain on and off during the day, she actually ate a very small meal.  The patient's chest pain lasted until she arrived at [facility] where her chest pain was relieved with aspirin and sublingual nitroglycerin.  The patient notes that this is quite different from her hiatal hernia and that her symptoms were always relieved by reflux.  She has actually been on a proton pump inhibitor for more than a year and has not had any reflux symptoms.  The patient is followed by Dr. Juanito Winters and had a negative stress Cardiolite over the last 2-3 years.  PAST MEDICAL HISTORY: 1.  Hypertension. 2. Hyperlipidemia. 3. Hiatal hernia. 4. 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 5. Hypothyroidism. 6. Osteoporosis.  SURGICAL HISTORY:  Status post hysterectomy, status post tonsillectomy, status post appendectomy.  ALLERGIES:  CODEINE, FLAGYL, PENICILLIN, and ALL STATINS, it cause myalgias.  MEDICATIONS: 1. Diovan/hydrochlorothiazide 160/12.5 daily. 2. Synthroid 25 mcg daily. 3. Nexium 40 mg daily.  FAMILY HISTORY:  Brother with an MI at 46.  SOCIAL HISTORY:  He does not smoke, does not drink.  PHYSICAL EXAMINATION:  GENERAL:  The patient is in no acute distress. VITAL SIGNS:  Blood pressure is 150/65, pulse is 95, she is 99% on room air, temperature is 98.7. NECK:  JVP is 6 cm.  No carotid bruits. HEART:  Regular rate and rhythm.  No murmurs, rubs, or gallops.  Normal S1 and S2.  No S3 or S4. LUNGS:  CTAB. ABDOMEN:  Soft, nontender, nondistended.  Positive bowel sounds. EXTREMITIES:  2+ femoral pulses.  No bruits.  2+ DP and PT pulses.  ANCILLARY STUDIES:  EKG pending at the time of this examination. Hematocrit is 39.2, platelets are 294, creatinine is 0.7, K is 3.6.  UA is benign.  Biomarkers, myoglobin 58.1.  Troponin negative less than 0.05.  CK-MB is 1.0.  ASSESSMENT/PLAN:  This is a 65 year old female with a history of hypertension, hyperlipidemia, and early family history of an myocardial infarction who presents with multiple episodes of substernal chest pressure lasting for several minutes.  These symptoms are concerning for unstable angina.  She does have a history of hiatal hernia, but her symptoms have pretty much been ameliorated over the past year with Nexium and she essentially has been asymptomatic from the aspect of her hiatal hernia and also her symptoms seem to be relieved promptly with her usual agents like Mylanta and Gas-X that was in the case.  At this time, I am going to treat her for unstable angina and start her  on Lovenox 1 mg/kg b.i.d.  We will plan for catheterization when feasible base time is scheduled.  Unstable angina/chest pain.  We will anticoagulate the patient for now with 1 mg/kg b.i.d.  We will continue beta-blocker.  She has an intolerance to statins, we so will just check a lipid profile and consider adding a very, very low dose of Crestor 2.5 mg maybe 2-3 times a week as needed.  For her history of hiatal hernia and reflux disease, we will continue her on her Nexium.  For her hypothyroidism, we will continue her Synthroid.  We will cycle her biomarkers as well.     Nicole Marker, MD     MLA/MEDQ  D:  08/03/2010  T:  08/03/2010  Job:  [id number]  Electronically Signed by Nicole Furl MD on 09/18/2010 11:29:51 AM

## 2010-09-28 ENCOUNTER — Other Ambulatory Visit: Payer: Self-pay | Admitting: Cardiology

## 2010-09-28 ENCOUNTER — Encounter: Payer: Self-pay | Admitting: Cardiology

## 2010-09-28 ENCOUNTER — Other Ambulatory Visit (INDEPENDENT_AMBULATORY_CARE_PROVIDER_SITE_OTHER): Payer: Medicare Other

## 2010-09-28 DIAGNOSIS — E785 Hyperlipidemia, unspecified: Secondary | ICD-10-CM

## 2010-09-28 LAB — BASIC METABOLIC PANEL
BUN: 11 mg/dL (ref 6–23)
Calcium: 9.1 mg/dL (ref 8.4–10.5)
Creatinine, Ser: 0.6 mg/dL (ref 0.4–1.2)
GFR: 98.6 mL/min (ref >60.00)

## 2010-09-28 LAB — LIPID PANEL
Cholesterol: 200 mg/dL (ref 0–200)
HDL: 50.3 mg/dL (ref >39.00)
Triglycerides: 240 mg/dL — ABNORMAL HIGH (ref 0.0–149.0)
VLDL: 48 mg/dL — ABNORMAL HIGH (ref 0.0–40.0)

## 2010-09-28 LAB — HEPATIC FUNCTION PANEL
Albumin: 4 g/dL (ref 3.5–5.2)
Total Bilirubin: 0.8 mg/dL (ref 0.3–1.2)

## 2010-09-28 LAB — LDL CHOLESTEROL, DIRECT: Direct LDL: 121.5 mg/dL

## 2010-10-08 LAB — BASIC METABOLIC PANEL
BUN: 12 mg/dL (ref 6–23)
Calcium: 9.7 mg/dL (ref 8.4–10.5)
Chloride: 103 mEq/L (ref 96–112)
Creatinine, Ser: 0.7 mg/dL (ref 0.4–1.2)
GFR calc non Af Amer: >60 mL/min (ref >60)

## 2010-10-08 LAB — CBC
HCT: 39.2 % (ref 36.0–46.0)
Hemoglobin: 13.3 g/dL (ref 12.0–15.0)
MCH: 29.2 pg (ref 26.0–34.0)
MCHC: 33.8 g/dL (ref 30.0–36.0)
RDW: 12.4 % (ref 11.5–15.5)

## 2010-10-08 LAB — DIFFERENTIAL
Basophils Absolute: 0.2 10*3/uL — ABNORMAL HIGH (ref 0.0–0.1)
Basophils Relative: 2 % — ABNORMAL HIGH (ref 0–1)
Eosinophils Absolute: 0.2 10*3/uL (ref 0.0–0.7)
Monocytes Absolute: 0.4 10*3/uL (ref 0.1–1.0)
Monocytes Relative: 4 % (ref 3–12)
Neutro Abs: 6.4 10*3/uL (ref 1.7–7.7)

## 2010-10-08 LAB — URINALYSIS, ROUTINE W REFLEX MICROSCOPIC
Bilirubin Urine: NEGATIVE
Hgb urine dipstick: NEGATIVE
Ketones, ur: NEGATIVE mg/dL
Nitrite: NEGATIVE
Protein, ur: NEGATIVE mg/dL
Urobilinogen, UA: 0.2 mg/dL (ref 0.0–1.0)

## 2010-10-08 LAB — URINE CULTURE
Colony Count: 55000
Culture  Setup Time: 201109251511

## 2010-12-08 NOTE — Consult Note (Signed)
[name], [name]               ACCOUNT NO.:  [id number]   MEDICAL RECORD NO.:  [id number]          PATIENT TYPE:  OBV   LOCATION:  4729                         FACILITY:  [facility]   PHYSICIAN:  Hillis Range, MD       DATE OF BIRTH:  12/12/1945   DATE OF CONSULTATION:  DATE OF DISCHARGE:  04/05/2008                                 CONSULTATION   REFERRING PHYSICIAN:  Hollice Espy, MD   REASON FOR CONSULTATION:  Premature ventricular contractions and  weakness.   HISTORY OF PRESENT ILLNESS:  [name] is a pleasant 65 year old female  with a history of hypothyroidism, hypertension, and hyperlipidemia, who  was admitted for further evaluation and management of fatigue and  lightheadedness.  The patient reports being in good health until  approximately 3-4 weeks ago when she developed gastroenteritis with  diarrhea and weakness.  She reports being quite washed out at that time  with dehydration.  The patient reports attempting to aggressively  hydrate herself at home thereafter.  She reports having significant  improvement in her symptoms as well as resolution of her  gastroenteritis.  Over the past 2 weeks, the patient has noticed  intermittent episodes of fatigue and feeling washed out again.  She  reports lightheadedness with these episodes and a feeling as if she  might collapse.  The patient denies any palpitations or heart racing.  She has not had chest discomfort, shortness of breath, or syncope.  The  patient presented to the emergency department last evening and was found  to have frequent premature ventricular contractions in a bigeminal  pattern.  She was therefore admitted for further evaluation.  The  patient has noticed some pounding of her neck and chest which she  describes as a forceful heartbeat, but denies palpitations or other  clear symptoms of PVCs.  She is unable to tell exactly when she has  PVCs.  She is unaware of the history of premature ventricular  contractions and has not determined any triggers or modifiers of her  arrhythmia.   PAST MEDICAL HISTORY:  1. Hypertension.  2. Hypercholesterolemia.  3. Hypothyroidism.  4. Osteoporosis.  5. Hiatal hernia.  6. Central tremors.  7. Status post hysterectomy.  8. Status post tonsillectomy.  9. Status post appendectomy.   MEDICATIONS:  1. Nexium 40 mg daily.  2. Synthroid 50 mcg daily.  3. Nitroglycerin 0.4 mg p.r.n.  4. Diovan/HCT 160/12.5 mg daily.   ALLERGIES:  CODEINE, FLAGYL, PENICILLIN.   SOCIAL HISTORY:  The patient lives with her spouse in [place].  She  denies tobacco, alcohol, or drug use.  She is a retired Loss adjuster, chartered.   FAMILY HISTORY:  Notable for coronary artery disease.   REVIEW OF SYSTEMS:  All systems were reviewed and were negative except  as outlined in the HPI above.   PHYSICAL EXAMINATION:  VITALS:  Blood pressure 121/78, heart rate 82,  respirations 20, sats 95%, temperature 97.3.  GENERAL:  The patient is a well-appearing female in no acute distress.  She is alert and oriented x3.  HEENT:  Normocephalic,  atraumatic.  Sclerae clear.  Conjunctivae pink.  Oropharynx clear.  NECK:  Supple.  No JVD, lymphadenopathy, or bruits.  HEART:  Regular rate and rhythm.  No murmurs, rubs, or gallops.  GI:  Soft, nontender, and nondistended.  Positive bowel sounds.  EXTREMITIES:  No clubbing, cyanosis, or edema.  NEUROLOGIC:  Cranial nerves II through XII are grossly intact.  Strength  and sensation are intact.  SKIN:  No ecchymosis or lacerations.  MUSCULOSKELETAL:  No deformity or atrophy.  PSYCH:  Euthymic mood.  Mildly anxious affect.   Telemetry reviewed which reveals occasional premature ventricular  contractions in a bigeminal pattern though her predominant rhythm is  sinus rhythm at 80 beats per minute.   EKG, normal sinus rhythm at 77 beats per minute, otherwise normal with  no PVCs captured on the EKG.   IMPRESSION:  [name] is  a very pleasant 65 year old female with a  history of hypertension, hyperlipidemia, and hypothyroidism, who now  presents with episodic fatigue and lightheadedness of unclear etiology.  The patient does not appear to have sustained arrhythmia by symptoms or  by telemetry.  She does appear however to have frequent premature  ventricular contractions which could possibly account for some of her  weakness.  She is recovering from a previous [place]-type  gastroenteritis and may have residual effects from her illness including  dehydration as a principal source for her symptoms.   PLAN:  An echocardiogram has been obtained, and results are currently  pending.  Thyroid profiles have also been ordered.  I would recommend  that we continue to hydrate the patient and replete her electrolytes as  you are doing.  We will continue to observe on the telemetry floor  sustained arrhythmias, although this seems unlikely.  Should the patient  have a structurally normal heart, then I would recommend that we follow  her in the outpatient setting with an event monitor.  An outpatient GXT,  MIBI also be helpful to further restratify the patient given her family  history of coronary artery disease, hypertension, and hyperlipidemia.  I  am reluctant to start beta-blockers for the patient's premature  ventricular contractions as these may precipitate more fatigue.  We  could certainly consider initiation of low-dose verapamil if the  patient's symptoms and premature ventricular contractions persist.  She  has scheduled  followup with Dr. Juanito Doom in the next few weeks in the outpatient  setting, and I will defer further treatments of the patient's  hyperlipidemia until her followup with him as she has previously not  tolerated statin medications due to myalgias.      Hillis Range, MD  Electronically Signed     JA/MEDQ  D:  04/05/2008  T:  04/06/2008  Job:  [id number]   cc:   Hollice Espy, M.D.   Miguel Aschoff, M.D.  Thomas C. Wall, MD, [facility]

## 2010-12-08 NOTE — H&P (Signed)
[name], [name]               ACCOUNT NO.:  [id number]   MEDICAL RECORD NO.:  [id number]          PATIENT TYPE:  EMS   LOCATION:  MAJO                         FACILITY:  [facility]   PHYSICIAN:  Hollice Espy, M.D.DATE OF BIRTH:  07/16/1946   DATE OF ADMISSION:  04/04/2008  DATE OF DISCHARGE:                              HISTORY & PHYSICAL   ATTENDING PHYSICIAN:  Hollice Espy, M.D.   PRIMARY CARE PHYSICIAN:  Miguel Aschoff, M.D.   CONSULTANTS ON THE CASE:   Cardiology.   CHIEF COMPLAINT:  Weakness and lightheadedness.   HISTORY OF PRESENT ILLNESS:  The patient is a 65 year old white female,  past medical history of hypertension and hypothyroidism, who for the  last several weeks has been complaining of palpitations and feeling  weak.  She had an appointment to see Dr. Daleen Squibb of [facility]  coming up in the next week, but in the last 24 hours she had a sudden  onset where she felt so lightheaded and now so weak she could barely  move that she came into the emergency room for further evaluation.  In  the emergency room, she was noted to have some low-grade temperature  around 99.6, and her  heart rate and EKG confirmed just a normal sinus  rhythm with a heart rate of 79.  Initial lab work noted was unremarkable  other than a white count of 10.5 but with an 84% shift.  Chest x-ray was  unremarkable, and the patient while on the telemetry monitor had sudden  onset tachycardia.  An EKG was done, which showed a sinus tachycardia  with a heart rate in the 130s.  This was self-contained and resolved on  its own.  Currently the patient is doing well.  She feels weak but  denies any headaches, vision changes, dysphagia.  No chest pain,  palpitations currently.  No wheezing or coughing.  No shortness of  breath.  No abdominal pain.  No hematuria, dysuria, constipation,  diarrhea, focal extremity numbness, weakness or pain.  Review of systems  otherwise negative.   The  patient's past medical history includes hypothyroidism,  hypertension, and GERD.  She has a history of benign childhood murmur.   MEDICATIONS:  She is on Unithroid 50 mcg p.o. daily, Nexium 40 p.o.  daily, and Diovan.   SHE HAS ALLERGIES TO CODEINE, FLAGYL AND PENICILLIN.   SOCIAL HISTORY:  No tobacco, alcohol or drug use.   FAMILY HISTORY:  Noncontributory.   PHYSICAL EXAMINATION:  VITALS ON ADMISSION:  Temperature 99.6, heart  rate 79, blood pressure 153/73.  She briefly had an elevated heart rate  up into the 130s, sinus tach, but has now returned back down to normal  rate.  Respirations 18, O2 sat 99% on 2 liters.  GENERAL:  She is alert and oriented x3, in no apparent distress.  HEENT:  Normocephalic, atraumatic.  Her mucous membranes are slightly  dry.  She has no carotid bruits.  HEART:  Regular rate and rhythm, S1 and S2 with a 2/6 systolic ejection  murmur.  LUNGS:  Clear to auscultation bilaterally.  ABDOMEN:  Soft, nontender, nondistended.  Positive bowel sounds.  EXTREMITIES:  Show no clubbing, cyanosis or edema.   LABORATORY WORK:  Sodium 140, potassium 3.4, chloride 105, BUN 6,  creatinine 0.8, glucose 88.  CPK 67.3, MB less than 1, troponin I less  than 0.05.  UA notes trace leukocyte esterase.  White count 10.5, H&H  13.2 and 40, MCV of 91, platelet count 276 with 84% neutrophils.  Chest  x-ray shows no acute chest findings.  EKG shows normal sinus rhythm.  A  second EKG shows sinus tachycardia.   ASSESSMENT/PLAN:  1. Palpitations, episodes of tachycardia.  Check a 2-D echo, check      TSH.  [facility] to see for consult.  This could be possibly      a tachybrady syndrome versus anxiety induced.  We will continue to      follow.  2. Questionable infection.  Normal white count, but with low-grade      temperature and positive shift, negative chest x-ray and UA we will      give no antibiotics at this time.  Follow labs and repeat in the      morning.  3.  Hypothyroidism.  Continue Unithroid.  Check TSH.  4. Hypertension.  5. Gastroesophageal reflux disease.  6. Hypokalemia.  We will replace.      Hollice Espy, M.D.  Electronically Signed     SKK/MEDQ  D:  04/04/2008  T:  04/04/2008  Job:  [id number]   cc:   Miguel Aschoff, M.D.  Thomas C. Wall, MD, [facility]

## 2010-12-08 NOTE — Assessment & Plan Note (Signed)
[facility]                            CARDIOLOGY OFFICE NOTE   NAME:[name], [name]                      MRN:          [id number]  DATE:04/17/2008                            DOB:          1945-08-03    [name] returns after being discharged from the hospital with extreme  fatigue and presyncope.   During her hospital stay, she ruled out for myocardial infarction.  Chest x-ray was unremarkable.  Her potassium was low at 3.4.  She had a  2-D echocardiogram which was normal.   As arrange to have an outpatient stress Myoview.  This demonstrated no  ischemia, normal contractility in taking all areas of the myocardium, EF  of 78%.   The biggest concern today that she has is that she has had some sores in  her mouth.  She is concerned about the potassium being caustic.  She is  also intolerant of ASCITIC FRUITS.   CURRENT MEDICATIONS:  1. Diovan 160/12.5 daily.  2. Nexium 40 mg daily.  3. Potassium 10 mEq a day.  4. Calcium.  5. Centrum Silver.  6. Vitamin D 800 units a day.   She also has had some palpitations and noted some PVCs in the hospital.   She had event recorder placed which she has not called any episodes.  She says these are usually short-lived to the point she cannot catch on.   PHYSICAL EXAMINATION:  VITAL SIGNS:  Her blood pressure today is 140/70,  pulse is 70 and regular.  Her weight is 127.  HEENT:  Normal.  NECK:  Carotid upstrokes are equal bilaterally without bruits.  No JVD.  Thyroid is not enlarged.  Trachea is midline.  LUNGS:  Clear.  HEART:  Regular rate and rhythm.  No gallop.  She has an occasional PVC  or extrasystole.  ABDOMEN:  Soft, good bowel sounds.  No midline bruits.  EXTREMITIES:  No sinus, clubbing, or edema.  Pulses are intact.  NEURO:  Intact.   [name] is stable from our standpoint.  I have gone over all the  results.  I have asked her to stop potassium and to increase potassium  rich foods that  are non-ascitic.  In addition, we will turned on her  event recorder.  I will see her back on a p.r.n. basis.   Note, that her TSH was also normal, which came back after she was  discharged from the hospital.     [name] Squibb, MD, [facility]  Electronically Signed    TCW/MedQ  DD: 04/17/2008  DT: 04/18/2008  Job #: [id number]   cc:   C. Duane Lope, M.D.

## 2010-12-08 NOTE — Discharge Summary (Signed)
[name], [name]               ACCOUNT NO.:  [id number]   MEDICAL RECORD NO.:  [id number]          PATIENT TYPE:  OBV   LOCATION:  4729                         FACILITY:  [facility]   PHYSICIAN:  Hillis Range, MD       DATE OF BIRTH:  23-Feb-1946   DATE OF ADMISSION:  04/04/2008  DATE OF DISCHARGE:  04/05/2008                               DISCHARGE SUMMARY   ALLERGIES:  The patient has allergies to FLAGYL, PENICILLIN, and  CODEINE.  The patient has intolerance to STATINS.  She has failed  treatment with Lipitor, Zocor, and pravastatin.   CARDIOLOGIST:  Jesse Sans. Daleen Squibb, MD, [facility]   Dr. Duane Lope, [facility] is her family physician.   FINAL DIAGNOSES:  1. Admitted with fatigue and presyncope.  2. Repetitive bigeminy on telemetry episodes lasting anywhere from 6-      14 seconds.  3. Some tingling in the right and left hand when she has these weak      spells and so weak that she is unable to put one foot in front of      another.  4. A 2-D echocardiogram April 05, 2008, ejection fraction 65%,      left ventricular systolic function normal, mild mitral      regurgitation, and trivial tricuspid regurgitation.  5. TSH has been drawn, but it is pending at the time of this      dictation.  6. Potassium 3.4 on admission.  The patient will be replenished and      will go home with potassium supplementation.  She is on      hydrochlorothiazide as well as Diovan for hypertension.  7. Significant elevated cholesterol and triglycerides as well as LDL      cholesterol.   SECONDARY DIAGNOSES:  1. Hypercholesterolemia.  2. Hypertension.  3. Hypothyroidism.   PROCEDURES THIS ADMISSION:  A 2-D echocardiogram April 05, 2008,  ejection fraction 65%, mild mitral regurgitation, trivial tricuspid  regurgitation, and normal left ventricular systolic function.   BRIEF HISTORY:  [name] is a 65 year old female.  She is a patient of  Dr. Daleen Squibb.  She had a viral illness about 1  week ago and then she got  over for the first few weeks, she felt well; but in the last week, the  week before this discharge, she had weak spells which suddenly watched  over her.  They are transient and do not seem to be necessarily  associated with any palpitation.  The patient will be admitted.  Her  lipid profile will be obtained.  She will have cardiac enzymes.  A 2-D  echocardiogram will be obtained.   If the echo shows normal left ventricular function, we will have  outpatient monitor and outpatient stress test.   HOSPITAL COURSE:  The patient admitted on April 04, 2008, with the  spells of overwhelming weakness and fatigue.  They are transient, but  they have been bothering her for at least the past week.  Her potassium  was low at 3.4 and this has been supplemented.  Her potassium on  hospital  day #2 was 4.1 and she will go home with potassium  supplementation.  Her lipid profile showed elevated cholesterol 286,  triglycerides 173, LDL cholesterol was 212, and HDL cholesterol 39.  TSH  has been drawn, but it is pending at the time of discharge.  Her cardiac  enzymes have been negative.  Telemetry has shown no tachy arrhythmias  and no brady arrhythmias.  However, she has had periods and episodes of  bigeminy.  They are very transient.  They last from 6-15 seconds and the  patient is not at least while she is here at the hospital relate any  feeling of palpitation with these.  She was seen by Dr. Johney Frame, who  thought that perhaps she might benefit from verapamil or a beta-blocker,  but this could possibly make her feelings of fatigue potentiated and  would rather wait and see if the patient on her own could resolve some  of the fatigue as an outpatient.   PLAN FOR DISCHARGE:  1. She will be given a prescription for verapamil 120 mg to take      possibly on a trial basis and to stop if it does not help her.  2. She will have outpatient monitor that she will pick up  Monday,      April 08, 2008, at 9 o'clock and she will have a stress test on      April 08, 2008, as well.  She will follow up with Dr. Daleen Squibb.      The TSH should be on eChart by the time of her visit with Dr. Daleen Squibb.   DISCHARGE MEDICATIONS:  Discharge medications are as follows:  1. Verapamil 120 mg, a new medication this is optional at present.  2. Diovan/hydrochlorothiazide 160/12.5 one tablet daily.  3. Potassium chloride 10 mEq daily.  This is new, she gets a      prescription for this.  4. Nexium 40 mg daily.  5. Unithroid 50 mcg a day.   LABORATORY STUDIES THIS ADMISSION:  Complete blood count:  Hemoglobin  12.1, hematocrit 35.7, white cells 7.5, platelets 220.  Sodium is 141,  potassium 4.1, chloride 107, carbonate 26, BUN is 4, creatinine 0.6,  glucose 84, cholesterol 286, triglycerides 173, LDL cholesterol 212, HDL  cholesterol is 39.  The patient has had myalgias with all of the statins  that she has ever used including Lipitor, Zocor, and pravastatin.  She  might be a candidate for TriCor.  Enzymes are 0.01 x2.  Alkaline  phosphatase 62, SGOT 23, SGPT is 14, and TSH is pending.      Maple Mirza, [place]      Hillis Range, MD  Electronically Signed    GM/MEDQ  D:  04/05/2008  T:  04/06/2008  Job:  [id number]   cc:   Thomas C. Wall, MD, FACC  C. Duane Lope, M.D.

## 2010-12-08 NOTE — Consult Note (Signed)
[name], [name]               ACCOUNT NO.:  [id number]   MEDICAL RECORD NO.:  [id number]          PATIENT TYPE:  OBV   LOCATION:  4729                         FACILITY:  [facility]   PHYSICIAN:  Madolyn Frieze. Jens Som, MD, FACCDATE OF BIRTH:  03/12/1946   DATE OF CONSULTATION:  04/04/2008  DATE OF DISCHARGE:                                 CONSULTATION   PRIMARY CARDIOLOGIST:  Jesse Sans. Daleen Squibb, MD, [facility]   PRIMARY CARE PHYSICIAN:  Dr. Duane Lope at [place] at [facility].   REASON FOR CONSULTATION:  Weakness.   HISTORY OF PRESENT ILLNESS:  This is a 65 year old Caucasian female with  complaints of 2 weeks of being weak and tired, 1 week ago had a near  syncopal episode with lightheadedness and unable to focus with vision.  Yesterday while eating breakfast at [employer], she again felt bad,  weak, dizziness at the table and went to the bathroom and was unable to  stand secondary to her legs being weak.  She went to see a PA at [facility] at [facility] and had another episode of weakness with  mild shortness of breath.  EKG showed heart rate tachycardic and then  bradycardic with frequent PVCs.  The patient was referred to [facility]  Cardiology and had an appointment scheduled with Dr. Valera Castle on  April 17, 2008.  However, this a.m., the patient again felt weak,  lightheadedness, and felt her heart racing and pounding in her head.  She felt tingling in her hands with these episodes.  Her husband called  the EMS after approximately 30 minutes of this and she was brought to  the emergency room.  The patient admits to having a recent GI virus  lasting approximately 10 days with nausea, vomiting, and diarrhea but  this has been over since x2 weeks.  She denied any chest pain or  shortness of breath.  She denied any actual syncope or recurrence of  nausea and vomiting.   REVIEW OF SYSTEMS:  Otherwise negative.   PAST MEDICAL HISTORY:  1. Hypertension.  2. Hiatal  hernia.  3. Osteoporosis.  4. Hypothyroidism.  5. Hypercholesterolemia.  6. 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 Sandria Manly in the      past.   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   SOCIAL HISTORY:  She lives in [place] with her husband.  She is a  retired Technical sales engineer.  She is married.  She does not smoke,  does not drink alcohol.   FAMILY HISTORY:  Mother died of lymphoma, but had a history of an MI.  Her father had coronary artery bypass grafting secondary to genetic  abnormality and pacemaker placement as well.  The patient's brother had  an MI and CABG at age 69.  She has another brother in good health.   CURRENT MEDICATIONS:  1. Euthroid 50 mcg daily.  2. Diovan HCT 160/12.5 mg daily.  3. Nexium 40 mg daily.   ALLERGIES:  FLAGYL, PENICILLIN, and CODEINE.   CURRENT LABS:  Sodium  140, potassium 3.4, chloride 105, CO2 of 29, BUN  6, creatinine 0.8, and glucose 88.  Hemoglobin 13.2, hematocrit 39.6,  white blood cells 10.5, and platelets 276.  CK 67.3, MB 1.0, and  troponin less than 0.05.   PHYSICAL EXAMINATION:  VITAL SIGNS:  Blood pressure 138/69, pulse 108,  respirations 20, temperature 99.5, and O2 sat 97% on 2L.  HEENT:  Head is normocephalic and atraumatic.  Eyes, PERRLA.  The  patient wears glasses.  Mucous membranes and mouth are pink and moist.  NECK:  There is no JVD.  No carotid bruits appreciated.  CARDIOVASCULAR:  Regular rate and rhythm with a soft S4 murmur  auscultated without rubs or gallops.  Pulses are 2+ and equal without  bruits.  LUNGS:  Essentially clear to auscultation without wheezes, rales, or  rhonchi.  ABDOMEN:  Soft, nontender, and 2+ bowel sounds.  EXTREMITIES:  No clubbing, cyanosis, or edema.  NEUROLOGIC:  Cranial nerves II through XII are grossly intact.   EKG revealing normal sinus rhythm.  Chest x-ray revealing no acute  findings.    IMPRESSION:  1. Weakness.  2. Palpitations.  3. History of hypothyroidism.  4. History of hypercholesterolemia.  5. Hypertension.  6. Mild hypokalemia.   PLAN:  The patient was seen and examined by myself and Dr. Olga Millers in the emergency room with a past medical history of  hypothyroidism, hypertension, and hypercholesterolemia with evaluation  for PVCs, weakness, and near syncope.  The patient states that these  dizzy spells last 5-30 minutes with no prior history of syncope or chest  pain.  This is not clearly orthostatic as the patient was at rest and  sitting during these episodes.  No other neuro symptoms.  Noted to have  PVCs on the monitor.  We were asked to see and evaluate.  Initial  enzymes are negative.  EKG revealing normal sinus rhythm with no ST  changes, doubt based on description weakness is cardiac in etiology.  Agree with checking echocardiogram.  If normal LV function, would follow  telemetry, consider outpatient  monitor, but also given family history, we will arrange for outpatient  stress test.  Recommend checking TSH.  We will replete potassium x1 and  follow making further recommendations based upon hospital course and  results of diagnostic testing.      Bettey Mare. Lyman Bishop, NP      Madolyn Frieze. Jens Som, MD, [facility]  Electronically Signed    KML/MEDQ  D:  04/04/2008  T:  04/05/2008  Job:  [id number]   cc:   Dr. Duane Lope

## 2011-01-18 ENCOUNTER — Telehealth: Payer: Self-pay | Admitting: Cardiology

## 2011-01-18 MED ORDER — NITROGLYCERIN 0.4 MG SL SUBL: 0.4000 mg | SUBLINGUAL_TABLET | SUBLINGUAL | Status: DC | PRN

## 2011-01-18 MED ORDER — ROSUVASTATIN CALCIUM 20 MG PO TABS: 20.0000 mg | ORAL_TABLET | Freq: Every day | ORAL | Status: DC

## 2011-01-18 MED ORDER — PRASUGREL HCL 10 MG PO TABS: 10.0000 mg | ORAL_TABLET | Freq: Every day | ORAL | Status: DC

## 2011-01-18 MED ORDER — METOPROLOL TARTRATE 25 MG PO TABS: 12.5000 mg | ORAL_TABLET | Freq: Two times a day (BID) | ORAL | Status: DC

## 2011-01-18 NOTE — Telephone Encounter (Signed)
Refill on all medication . Effient/  Crestor/  Metoprolol /  nitro.  [facility] [place]

## 2011-02-11 ENCOUNTER — Other Ambulatory Visit: Payer: Self-pay | Admitting: Internal Medicine

## 2011-02-16 ENCOUNTER — Encounter: Payer: Self-pay | Admitting: Cardiology

## 2011-02-19 ENCOUNTER — Ambulatory Visit (INDEPENDENT_AMBULATORY_CARE_PROVIDER_SITE_OTHER): Payer: Medicare Other | Admitting: Cardiology

## 2011-02-19 ENCOUNTER — Encounter: Payer: Self-pay | Admitting: Cardiology

## 2011-02-19 VITALS — BP 130/68 | HR 72 | Ht 63.0 in | Wt 141.0 lb

## 2011-02-19 DIAGNOSIS — I2510 Atherosclerotic heart disease of native coronary artery without angina pectoris: Secondary | ICD-10-CM

## 2011-02-19 DIAGNOSIS — I10 Essential (primary) hypertension: Secondary | ICD-10-CM

## 2011-02-19 DIAGNOSIS — I4949 Other premature depolarization: Secondary | ICD-10-CM

## 2011-02-19 DIAGNOSIS — E785 Hyperlipidemia, unspecified: Secondary | ICD-10-CM

## 2011-02-19 NOTE — Assessment & Plan Note (Signed)
No angina or ischemic symptoms. Though expensive, she would like to continue with the Effient, since she is doing so well.

## 2011-02-19 NOTE — Assessment & Plan Note (Signed)
Remarkable improvement on Crestor. She has been able to tolerate other statins and is delighted with her results. No changes.

## 2011-02-19 NOTE — Progress Notes (Signed)
HPI [name] returns for evaluation and management for coronary disease. Is having PCI of the obtuse marginal back in January, he has done remarkably well. She is having no angina or ischemic symptoms. She does have occasional indigestion after eating. He says he can tell the difference from her ischemic pain.  She is compliant with her medications. She is delighted that she can tolerate the Crestor. Her numbers look remarkably better.   Past Medical History  Diagnosis Date  . Premature ventricular contractions   . Coronary atherosclerosis of native coronary artery   . HLD (hyperlipidemia)   . HTN (hypertension)   . Hiatal hernia   . Hypothyroidism     Past Surgical History  Procedure Date  . Hysterectomy (other)   . Tonsillectomy   . Appendectomy     Family History  Problem Relation Age of Onset  . Heart attack Brother 40    History   Social History  . Marital Status: Married    Spouse Name: N/A    Number of Children: N/A  . Years of Education: N/A   Occupational History  . Not on file.   Social History Main Topics  . Smoking status: Never Smoker   . Smokeless tobacco: Not on file   Comment: tobacco use- no  . Alcohol Use: No  . Drug Use: No  . Sexually Active: Not on file   Other Topics Concern  . Not on file   Social History Narrative  . No narrative on file    Allergies  Allergen Reactions  . Atorvastatin     REACTION: myaligia  . Codeine   . Metronidazole   . Penicillins   . Simvastatin     REACTION: myalgia    Current Outpatient Prescriptions  Medication Sig Dispense Refill  . acetaminophen (TYLENOL) 500 MG tablet Take 1,000 mg by mouth every 6 (six) hours as needed.        Marland Kitchen aspirin (ASPIR-81) 81 MG EC tablet Take 81 mg by mouth daily.        . Calcium Carbonate (CALTRATE 600) 1500 MG TABS Take by mouth 2 (two) times daily.        . Cholecalciferol (VITAMIN D3) 1000 UNITS CAPS Take by mouth daily.        . CRESTOR 20 MG tablet TAKE 1  TABLET BY MOUTH AT BEDTIME  30 tablet  5  . esomeprazole (NEXIUM) 40 MG capsule Take 40 mg by mouth daily.        Marland Kitchen levothyroxine (UNITHROID) 50 MCG tablet Take 50 mcg by mouth daily.        . metoprolol tartrate (LOPRESSOR) 25 MG tablet Take 1 tablet (25 mg total) by mouth 2 (two) times daily.  30 tablet  7  . Multiple Vitamins-Minerals (MULTIVITAL) tablet Take 1 tablet by mouth daily.        . nitroGLYCERIN (NITROSTAT) 0.4 MG SL tablet Place 1 tablet (0.4 mg total) under the tongue every 5 (five) minutes as needed for chest pain.  25 tablet  11  . prasugrel (EFFIENT) 10 MG TABS Take 1 tablet (10 mg total) by mouth daily.  30 tablet  7  . valsartan-hydrochlorothiazide (DIOVAN HCT) 160-12.5 MG per tablet Take 1 tablet by mouth daily.          ROS Negative other than HPI.   PE General Appearance: well developed, well nourished in no acute distress HEENT: symmetrical face, PERRLA, good dentition  Neck: no JVD, thyromegaly, or adenopathy, trachea midline  Chest: symmetric without deformity Cardiac: PMI non-displaced, RRR, normal S1, S2, no gallop or murmur Lung: clear to ausculation and percussion Vascular: all pulses full without bruits  Abdominal: nondistended, nontender, good bowel sounds, no HSM, no bruits Extremities: no cyanosis, clubbing or edema, no sign of DVT, no varicosities  Skin: normal color, no rashes Neuro: alert and oriented x 3, non-focal Pysch: normal affect Filed Vitals:   02/19/11 1410  BP: 130/68  Pulse: 72  Height: 5\' 3"  (1.6 m)  Weight: 141 lb (63.957 kg)    EKG  Labs and Studies Reviewed.   Lab Results  Component Value Date   WBC 9.1 08/25/2010   HGB 12.3 08/25/2010   HCT 38.6 08/25/2010   MCV 88.7 08/25/2010   PLT 270 08/25/2010      Chemistry      Component Value Date/Time   NA 138 09/28/2010 0914   K 3.9 09/28/2010 0914   CL 100 09/28/2010 0914   CO2 30 09/28/2010 0914   BUN 11 09/28/2010 0914   CREATININE 0.6 09/28/2010 0914      Component Value  Date/Time   CALCIUM 9.1 09/28/2010 0914   ALKPHOS 102 09/28/2010 0914   AST 29 09/28/2010 0914   ALT 23 09/28/2010 0914   BILITOT 0.8 09/28/2010 0914       Lab Results  Component Value Date   CHOL 200 09/28/2010   CHOL  Value: 254        ATP III CLASSIFICATION:  <200     mg/dL   Desirable  829-562  mg/dL   Borderline High  >=130    mg/dL   High       * 8/65/7846   CHOL  Value: 303        ATP III CLASSIFICATION:  <200     mg/dL   Desirable  962-952  mg/dL   Borderline High  >=841    mg/dL   High       * 09/25/4399   Lab Results  Component Value Date   HDL 50.30 09/28/2010   HDL 44 08/23/2010   HDL 43 0/08/7251   Lab Results  Component Value Date   LDLCALC  Value: 161        Total Cholesterol/HDL:CHD Risk Coronary Heart Disease Risk Table                     Men   Women  1/2 Average Risk   3.4   3.3  Average Risk       5.0   4.4  2 X Average Risk   9.6   7.1  3 X Average Risk  23.4   11.0        Use the calculated Patient Ratio above and the CHD Risk Table to determine the patient's CHD Risk.        ATP III CLASSIFICATION (LDL):  <100     mg/dL   Optimal  664-403  mg/dL   Near or Above                    Optimal  130-159  mg/dL   Borderline  474-259  mg/dL   High  >563     mg/dL   Very High* 8/75/6433   LDLCALC  Value: 216        Total Cholesterol/HDL:CHD Risk Coronary Heart Disease Risk Table  Men   Women  1/2 Average Risk   3.4   3.3  Average Risk       5.0   4.4  2 X Average Risk   9.6   7.1  3 X Average Risk  23.4   11.0        Use the calculated Patient Ratio above and the CHD Risk Table to determine the patient's CHD Risk.        ATP III CLASSIFICATION (LDL):  <100     mg/dL   Optimal  191-478  mg/dL   Near or Above                    Optimal  130-159  mg/dL   Borderline  295-621  mg/dL   High  >308     mg/dL   Very High* 12/28/7844   Lab Results  Component Value Date   TRIG 240.0* 09/28/2010   TRIG 245* 08/23/2010   TRIG 220* 08/03/2010   Lab Results  Component Value Date   CHOLHDL  4 09/28/2010   CHOLHDL 5.8 08/23/2010   CHOLHDL 7.0 08/03/2010   Lab Results  Component Value Date   HGBA1C  Value: 5.6 (NOTE)                                                                       According to the ADA Clinical Practice Recommendations for 2011, when HbA1c is used as a screening test:   >=6.5%   Diagnostic of Diabetes Mellitus           (if abnormal result  is confirmed)  5.7-6.4%   Increased risk of developing Diabetes Mellitus  References:Diagnosis and Classification of Diabetes Mellitus,Diabetes Care,2011,34(Suppl 1):S62-S69 and Standards of Medical Care in         Diabetes - 2011,Diabetes Care,2011,34  (Suppl 1):S11-S61. 08/24/2010   Lab Results  Component Value Date   ALT 23 09/28/2010   AST 29 09/28/2010   ALKPHOS 102 09/28/2010   BILITOT 0.8 09/28/2010   No results found for this basename: TSH

## 2011-02-19 NOTE — Patient Instructions (Signed)
Your physician recommends that you schedule a follow-up appointment in: 6 months with Dr. Wall  

## 2011-02-19 NOTE — Assessment & Plan Note (Signed)
Improved. No changes in treatment.

## 2011-04-28 LAB — CARDIAC PANEL(CRET KIN+CKTOT+MB+TROPI)
CK, MB: 1.5
Relative Index: INVALID
Total CK: 78
Troponin I: 0.01

## 2011-04-28 LAB — URINE MICROSCOPIC-ADD ON

## 2011-04-28 LAB — DIFFERENTIAL
Basophils Absolute: 0
Eosinophils Absolute: 0.2
Lymphocytes Relative: 12
Lymphocytes Relative: 34
Lymphs Abs: 1.3
Lymphs Abs: 2.5
Neutro Abs: 4.2
Neutro Abs: 8.9 — ABNORMAL HIGH
Neutrophils Relative %: 56

## 2011-04-28 LAB — POCT I-STAT, CHEM 8
Calcium, Ion: 1.17
Creatinine, Ser: 0.8
Glucose, Bld: 88
HCT: 40
Hemoglobin: 13.6
Potassium: 3.4 — ABNORMAL LOW
TCO2: 29

## 2011-04-28 LAB — CBC
Hemoglobin: 13.2
Platelets: 220
Platelets: 276
RDW: 12.9
WBC: 10.5
WBC: 7.5

## 2011-04-28 LAB — BASIC METABOLIC PANEL
Chloride: 107
GFR calc Af Amer: >60
Potassium: 4.1

## 2011-04-28 LAB — LIPID PANEL
Cholesterol: 286 — ABNORMAL HIGH
HDL: 39 — ABNORMAL LOW
Total CHOL/HDL Ratio: 7.3

## 2011-04-28 LAB — URINALYSIS, ROUTINE W REFLEX MICROSCOPIC
Bilirubin Urine: NEGATIVE
Glucose, UA: NEGATIVE
Nitrite: NEGATIVE
Specific Gravity, Urine: 1.004 — ABNORMAL LOW
pH: 7.5

## 2011-04-28 LAB — HEPATIC FUNCTION PANEL
ALT: 14
Albumin: 3.3 — ABNORMAL LOW
Alkaline Phosphatase: 62
Total Protein: 5.8 — ABNORMAL LOW

## 2011-04-28 LAB — POCT CARDIAC MARKERS: Troponin i, poc: <0.05

## 2011-06-16 ENCOUNTER — Emergency Department (INDEPENDENT_AMBULATORY_CARE_PROVIDER_SITE_OTHER): Payer: Medicare Other

## 2011-06-16 ENCOUNTER — Emergency Department (HOSPITAL_BASED_OUTPATIENT_CLINIC_OR_DEPARTMENT_OTHER)
Admission: EM | Admit: 2011-06-16 | Discharge: 2011-06-16 | Disposition: A | Discharge status: Home / Self Care | Payer: Medicare Other | Attending: Emergency Medicine | Admitting: Emergency Medicine

## 2011-06-16 ENCOUNTER — Encounter (HOSPITAL_BASED_OUTPATIENT_CLINIC_OR_DEPARTMENT_OTHER): Payer: Self-pay | Admitting: Family Medicine

## 2011-06-16 ENCOUNTER — Other Ambulatory Visit: Payer: Self-pay

## 2011-06-16 DIAGNOSIS — E079 Disorder of thyroid, unspecified: Secondary | ICD-10-CM | POA: Insufficient documentation

## 2011-06-16 DIAGNOSIS — E785 Hyperlipidemia, unspecified: Secondary | ICD-10-CM | POA: Insufficient documentation

## 2011-06-16 DIAGNOSIS — Z79899 Other long term (current) drug therapy: Secondary | ICD-10-CM | POA: Insufficient documentation

## 2011-06-16 DIAGNOSIS — R079 Chest pain, unspecified: Secondary | ICD-10-CM | POA: Insufficient documentation

## 2011-06-16 DIAGNOSIS — R071 Chest pain on breathing: Secondary | ICD-10-CM

## 2011-06-16 DIAGNOSIS — I10 Essential (primary) hypertension: Secondary | ICD-10-CM | POA: Insufficient documentation

## 2011-06-16 LAB — BASIC METABOLIC PANEL
CO2: 28 mEq/L (ref 19–32)
Chloride: 100 mEq/L (ref 96–112)
Glucose, Bld: 116 mg/dL — ABNORMAL HIGH (ref 70–99)
Potassium: 3.3 mEq/L — ABNORMAL LOW (ref 3.5–5.1)
Sodium: 139 mEq/L (ref 135–145)

## 2011-06-16 LAB — CARDIAC PANEL(CRET KIN+CKTOT+MB+TROPI)
CK, MB: 2.7 ng/mL (ref 0.3–4.0)
Troponin I: <0.3 ng/mL (ref <0.30)

## 2011-06-16 LAB — DIFFERENTIAL
Lymphocytes Relative: 33 % (ref 12–46)
Lymphs Abs: 4.2 10*3/uL — ABNORMAL HIGH (ref 0.7–4.0)
Neutrophils Relative %: 58 % (ref 43–77)

## 2011-06-16 LAB — HEPATIC FUNCTION PANEL
Albumin: 4.1 g/dL (ref 3.5–5.2)
Total Bilirubin: 0.8 mg/dL (ref 0.3–1.2)
Total Protein: 7.6 g/dL (ref 6.0–8.3)

## 2011-06-16 LAB — CK TOTAL AND CKMB (NOT AT ARMC)
CK, MB: 2.7 ng/mL (ref 0.3–4.0)
Total CK: 66 U/L (ref 7–177)

## 2011-06-16 LAB — TROPONIN I: Troponin I: <0.3 ng/mL (ref <0.30)

## 2011-06-16 LAB — CBC
Platelets: 289 10*3/uL (ref 150–400)
RBC: 4.5 MIL/uL (ref 3.87–5.11)
WBC: 12.8 10*3/uL — ABNORMAL HIGH (ref 4.0–10.5)

## 2011-06-16 MED ORDER — ASPIRIN 81 MG PO CHEW
324.0000 mg | CHEWABLE_TABLET | Freq: Once | ORAL | Status: AC
  Administered 2011-06-16: 324 mg via ORAL
  Filled 2011-06-16: qty 4

## 2011-06-16 MED ORDER — NITROGLYCERIN 0.4 MG SL SUBL
0.4000 mg | SUBLINGUAL_TABLET | SUBLINGUAL | Status: DC | PRN
  Administered 2011-06-16: 0.4 mg via SUBLINGUAL
  Filled 2011-06-16: qty 25

## 2011-06-16 NOTE — ED Provider Notes (Signed)
History     CSN: [id number] Arrival date & time: 06/16/2011 11:34 AM   First MD Initiated Contact with Patient 06/16/11 1153      Chief Complaint  Patient presents with  . Chest Pain    (Consider location/radiation/quality/duration/timing/severity/associated sxs/prior treatment) HPI Comments: Had stent placed in 07/2010, then was re-cathed three weeks later and was okay.  No problems since.  Patient is a 65 y.o. female presenting with chest pain. The history is provided by the patient.  Chest Pain The chest pain began 1 - 2 hours ago. Chest pain occurs constantly. The chest pain is improving. At its most intense, the pain is at 8/10. The pain is currently at 3/10. The severity of the pain is moderate. The quality of the pain is described as sharp. The pain does not radiate. Chest pain is worsened by certain positions. Pertinent negatives for primary symptoms include no fever, no palpitations, no nausea and no dizziness. She tried nitroglycerin (with some relief) for the symptoms.     Past Medical History  Diagnosis Date  . Premature ventricular contractions   . Coronary atherosclerosis of native coronary artery   . HLD (hyperlipidemia)   . HTN (hypertension)   . Hiatal hernia   . Hypothyroidism     Past Surgical History  Procedure Date  . Hysterectomy (other)   . Tonsillectomy   . Appendectomy   . Cardiac catheterization     Family History  Problem Relation Age of Onset  . Heart attack Brother 40    History  Substance Use Topics  . Smoking status: Never Smoker   . Smokeless tobacco: Not on file   Comment: tobacco use- no  . Alcohol Use: No    OB History    Grav Para Term Preterm Abortions TAB SAB Ect Mult Living                  Review of Systems  Constitutional: Negative for fever.  Cardiovascular: Positive for chest pain. Negative for palpitations.  Gastrointestinal: Negative for nausea.  Neurological: Negative for dizziness.  All other systems  reviewed and are negative.    Allergies  Atorvastatin; Codeine; Metronidazole; Penicillins; and Simvastatin  Home Medications   Current Outpatient Rx  Name Route Sig Dispense Refill  . ACETAMINOPHEN 500 MG PO TABS Oral Take 1,000 mg by mouth every 6 (six) hours as needed.      . ASPIRIN 81 MG PO TBEC Oral Take 81 mg by mouth daily.      Marland Kitchen CALCIUM CARBONATE 1500 MG PO TABS Oral Take by mouth 2 (two) times daily.      Marland Kitchen VITAMIN D3 1000 UNITS PO CAPS Oral Take by mouth daily.      . CRESTOR 20 MG PO TABS  TAKE 1 TABLET BY MOUTH AT BEDTIME 30 tablet 5  . ESOMEPRAZOLE MAGNESIUM 40 MG PO CPDR Oral Take 40 mg by mouth daily.      Marland Kitchen LEVOTHYROXINE SODIUM 50 MCG PO TABS Oral Take 50 mcg by mouth daily.      Marland Kitchen METOPROLOL TARTRATE 25 MG PO TABS Oral Take 1 tablet (25 mg total) by mouth 2 (two) times daily. 30 tablet 7  . MULTIVITAL PO TABS Oral Take 1 tablet by mouth daily.      Marland Kitchen NITROGLYCERIN 0.4 MG SL SUBL Sublingual Place 1 tablet (0.4 mg total) under the tongue every 5 (five) minutes as needed for chest pain. 25 tablet 11  . PRASUGREL HCL 10 MG PO TABS  Oral Take 1 tablet (10 mg total) by mouth daily. 30 tablet 7  . VALSARTAN-HYDROCHLOROTHIAZIDE 160-12.5 MG PO TABS Oral Take 1 tablet by mouth daily.        BP 169/76  Pulse 83  Temp(Src) 98 F (36.7 C) (Oral)  Resp 18  Ht 5\' 3"  (1.6 m)  Wt 138 lb (62.596 kg)  BMI 24.45 kg/m2  SpO2 99%  Physical Exam  Nursing note and vitals reviewed. Constitutional: She is oriented to person, place, and time. She appears well-developed and well-nourished. No distress.  HENT:  Head: Normocephalic and atraumatic.  Neck: Normal range of motion. Neck supple.  Cardiovascular: Normal rate and regular rhythm.  Exam reveals no gallop and no friction rub.   No murmur heard. Pulmonary/Chest: Effort normal and breath sounds normal. No respiratory distress. She has no wheezes.  Abdominal: Soft. Bowel sounds are normal. She exhibits no distension. There is no  tenderness.  Musculoskeletal: Normal range of motion. She exhibits no edema.  Neurological: She is alert and oriented to person, place, and time.  Skin: Skin is warm and dry. She is not diaphoretic.    ED Course  Procedures (including critical care time)  Labs Reviewed  CBC - Abnormal; Notable for the following:    WBC 12.8 (*)    All other components within normal limits  DIFFERENTIAL - Abnormal; Notable for the following:    Lymphs Abs 4.2 (*)    All other components within normal limits  BASIC METABOLIC PANEL  CARDIAC PANEL(CRET KIN+CKTOT+MB+TROPI)  HEPATIC FUNCTION PANEL   No results found.   No diagnosis found.   Date: 06/16/2011  Rate: 78  Rhythm: normal sinus rhythm  QRS Axis: normal  Intervals: normal  ST/T Wave abnormalities: normal  Conduction Disutrbances:none  Narrative Interpretation:   Old EKG Reviewed: unchanged    MDM  Repeat enzymes are negative three hours after pain resolved.  Patient wants to go home.  Will discharge but needs to follow up with her cardiologist next week.        Geoffery Lyons, MD 06/16/11 [phone]

## 2011-06-16 NOTE — ED Notes (Signed)
EDP Del[place] in with pt-states plan to draw cardiac markers at 2pm and then possible d/c-pt aware

## 2011-06-16 NOTE — ED Notes (Signed)
Pt c/o central cp onset 1 hr ago while sitting. Pt denies n/v, shob. Pt sts she took 1 NTG SL 1 hr ago. Pt reports h/o cardiac stent. Rates pain 7/10. Pt sts pain was 9/10 at onset. Pt sts "feels like it could be indigestion".

## 2011-08-10 ENCOUNTER — Ambulatory Visit (INDEPENDENT_AMBULATORY_CARE_PROVIDER_SITE_OTHER): Payer: Medicare Other | Admitting: Cardiology

## 2011-08-10 ENCOUNTER — Encounter: Payer: Self-pay | Admitting: Cardiology

## 2011-08-10 VITALS — BP 136/74 | HR 76 | Ht 63.0 in | Wt 140.0 lb

## 2011-08-10 DIAGNOSIS — I10 Essential (primary) hypertension: Secondary | ICD-10-CM | POA: Diagnosis not present

## 2011-08-10 DIAGNOSIS — I2510 Atherosclerotic heart disease of native coronary artery without angina pectoris: Secondary | ICD-10-CM

## 2011-08-10 DIAGNOSIS — I4949 Other premature depolarization: Secondary | ICD-10-CM | POA: Diagnosis not present

## 2011-08-10 DIAGNOSIS — E785 Hyperlipidemia, unspecified: Secondary | ICD-10-CM

## 2011-08-10 MED ORDER — ROSUVASTATIN CALCIUM 20 MG PO TABS: 20.0000 mg | ORAL_TABLET | Freq: Every day | ORAL | Status: DC

## 2011-08-10 MED ORDER — METOPROLOL TARTRATE 25 MG PO TABS: 12.5000 mg | ORAL_TABLET | Freq: Two times a day (BID) | ORAL | Status: DC

## 2011-08-10 MED ORDER — NITROGLYCERIN 0.4 MG SL SUBL: 0.4000 mg | SUBLINGUAL_TABLET | SUBLINGUAL | Status: DC | PRN

## 2011-08-10 MED ORDER — PRASUGREL HCL 10 MG PO TABS: 10.0000 mg | ORAL_TABLET | Freq: Every day | ORAL | Status: DC

## 2011-08-10 NOTE — Progress Notes (Signed)
HPI [name] comes in for followup of her coronary artery disease. She had unstable angina last January and received a drug-eluting stent to a tight marginal branch of the circumflex.  She had an episode of chest discomfort back around Thanksgiving. She went to an urgent care and was given nitroglycerin without any change. Her EKG and enzymes were normal she said. She has not had any further episodes. She denies any exertional chest tightness or pressure. He has occasional indigestion. He carries nitroglycerin.  Past Medical History  Diagnosis Date  . Premature ventricular contractions   . Coronary atherosclerosis of native coronary artery   . HLD (hyperlipidemia)   . HTN (hypertension)   . Hiatal hernia   . Hypothyroidism     Current Outpatient Prescriptions  Medication Sig Dispense Refill  . acetaminophen (TYLENOL) 500 MG tablet Take 1,000 mg by mouth every 6 (six) hours as needed.        Marland Kitchen aspirin (ASPIR-81) 81 MG EC tablet Take 81 mg by mouth daily.        . Calcium Carbonate (CALTRATE 600) 1500 MG TABS Take by mouth 2 (two) times daily.        . Cholecalciferol (VITAMIN D3) 1000 UNITS CAPS Take by mouth daily.        . CRESTOR 20 MG tablet TAKE 1 TABLET BY MOUTH AT BEDTIME  30 tablet  5  . esomeprazole (NEXIUM) 40 MG capsule Take 40 mg by mouth daily.        Marland Kitchen levothyroxine (UNITHROID) 50 MCG tablet Take 50 mcg by mouth daily.        . metoprolol tartrate (LOPRESSOR) 25 MG tablet Take 1 tablet (25 mg total) by mouth 2 (two) times daily.  30 tablet  7  . Multiple Vitamins-Minerals (MULTIVITAL) tablet Take 1 tablet by mouth daily.        . nitroGLYCERIN (NITROSTAT) 0.4 MG SL tablet Place 1 tablet (0.4 mg total) under the tongue every 5 (five) minutes as needed for chest pain.  25 tablet  11  . prasugrel (EFFIENT) 10 MG TABS Take 1 tablet (10 mg total) by mouth daily.  30 tablet  7  . valsartan-hydrochlorothiazide (DIOVAN HCT) 160-12.5 MG per tablet Take 1 tablet by mouth daily.           Allergies  Allergen Reactions  . Atorvastatin     REACTION: myaligia  . Codeine   . Flagyl (Metronidazole Hcl)   . Metronidazole   . Penicillins   . Simvastatin     REACTION: myalgia    Family History  Problem Relation Age of Onset  . Heart attack Brother 40    History   Social History  . Marital Status: Married    Spouse Name: N/A    Number of Children: N/A  . Years of Education: N/A   Occupational History  . Not on file.   Social History Main Topics  . Smoking status: Never Smoker   . Smokeless tobacco: Not on file   Comment: tobacco use- no  . Alcohol Use: No  . Drug Use: No  . Sexually Active: Not on file   Other Topics Concern  . Not on file   Social History Narrative  . No narrative on file    ROS ALL NEGATIVE EXCEPT THOSE NOTED IN HPI  PE  General Appearance: well developed, well nourished in no acute distress HEENT: symmetrical face, PERRLA, good dentition  Neck: no JVD, thyromegaly, or adenopathy, trachea midline Chest: symmetric  without deformity Cardiac: PMI non-displaced, RRR, normal S1, S2, no gallop or murmur Lung: clear to ausculation and percussion Vascular: all pulses full without bruits  Abdominal: nondistended, nontender, good bowel sounds, no HSM, no bruits Extremities: no cyanosis, clubbing or edema, no sign of DVT, no varicosities  Skin: normal color, no rashes Neuro: alert and oriented x 3, non-focal Pysch: normal affect  EKG Not repeated BMET    Component Value Date/Time   NA 139 06/16/2011 1146   K 3.3* 06/16/2011 1146   CL 100 06/16/2011 1146   CO2 28 06/16/2011 1146   GLUCOSE 116* 06/16/2011 1146   BUN 11 06/16/2011 1146   CREATININE 0.60 06/16/2011 1146   CALCIUM 9.4 06/16/2011 1146   GFRNONAA >90 06/16/2011 1146   GFRAA >90 06/16/2011 1146    Lipid Panel     Component Value Date/Time   CHOL 200 09/28/2010 0914   TRIG 240.0* 09/28/2010 0914   HDL 50.30 09/28/2010 0914   CHOLHDL 4 09/28/2010 0914   VLDL 48.0*  09/28/2010 0914   LDLCALC  Value: 161        Total Cholesterol/HDL:CHD Risk Coronary Heart Disease Risk Table                     Men   Women  1/2 Average Risk   3.4   3.3  Average Risk       5.0   4.4  2 X Average Risk   9.6   7.1  3 X Average Risk  23.4   11.0        Use the calculated Patient Ratio above and the CHD Risk Table to determine the patient's CHD Risk.        ATP III CLASSIFICATION (LDL):  <100     mg/dL   Optimal  161-096  mg/dL   Near or Above                    Optimal  130-159  mg/dL   Borderline  045-409  mg/dL   High  >811     mg/dL   Very High* 04/08/7828 0203    CBC    Component Value Date/Time   WBC 12.8* 06/16/2011 1146   RBC 4.50 06/16/2011 1146   HGB 12.6 06/16/2011 1146   HCT 38.8 06/16/2011 1146   PLT 289 06/16/2011 1146   MCV 86.2 06/16/2011 1146   MCH 28.0 06/16/2011 1146   MCHC 32.5 06/16/2011 1146   RDW 13.2 06/16/2011 1146   LYMPHSABS 4.2* 06/16/2011 1146   MONOABS 0.9 06/16/2011 1146   EOSABS 0.3 06/16/2011 1146   BASOSABS 0.1 06/16/2011 1146

## 2011-08-10 NOTE — Assessment & Plan Note (Signed)
Patient advised to get fasting lipids and blood work primary care physician. Goal LDL less than 70.

## 2011-08-10 NOTE — Assessment & Plan Note (Signed)
Stable. No change in medical therapy. Prescriptions renewed. Advised to get followup blood work including a lipid panel at her primary care physician Dr. Tenny Craw.

## 2011-08-10 NOTE — Patient Instructions (Signed)
Your physician wants you to follow-up in:  12 months.  You will receive a reminder letter in the mail two months in advance. If you don't receive a letter, please call our office to schedule the follow-up appointment.   

## 2011-09-22 DIAGNOSIS — H2510 Age-related nuclear cataract, unspecified eye: Secondary | ICD-10-CM | POA: Diagnosis not present

## 2011-10-13 DIAGNOSIS — N3000 Acute cystitis without hematuria: Secondary | ICD-10-CM | POA: Diagnosis not present

## 2011-10-13 DIAGNOSIS — D492 Neoplasm of unspecified behavior of bone, soft tissue, and skin: Secondary | ICD-10-CM | POA: Diagnosis not present

## 2011-10-13 DIAGNOSIS — R3989 Other symptoms and signs involving the genitourinary system: Secondary | ICD-10-CM | POA: Diagnosis not present

## 2011-11-18 DIAGNOSIS — L820 Inflamed seborrheic keratosis: Secondary | ICD-10-CM | POA: Diagnosis not present

## 2011-11-18 DIAGNOSIS — D239 Other benign neoplasm of skin, unspecified: Secondary | ICD-10-CM | POA: Diagnosis not present

## 2012-04-10 ENCOUNTER — Encounter (HOSPITAL_BASED_OUTPATIENT_CLINIC_OR_DEPARTMENT_OTHER): Payer: Self-pay

## 2012-04-10 ENCOUNTER — Emergency Department (HOSPITAL_BASED_OUTPATIENT_CLINIC_OR_DEPARTMENT_OTHER)
Admission: EM | Admit: 2012-04-10 | Discharge: 2012-04-10 | Disposition: A | Discharge status: Home / Self Care | Payer: Medicare Other | Attending: Emergency Medicine | Admitting: Emergency Medicine

## 2012-04-10 DIAGNOSIS — R5383 Other fatigue: Secondary | ICD-10-CM | POA: Diagnosis not present

## 2012-04-10 DIAGNOSIS — E039 Hypothyroidism, unspecified: Secondary | ICD-10-CM | POA: Diagnosis not present

## 2012-04-10 DIAGNOSIS — I2510 Atherosclerotic heart disease of native coronary artery without angina pectoris: Secondary | ICD-10-CM | POA: Diagnosis not present

## 2012-04-10 DIAGNOSIS — Z79899 Other long term (current) drug therapy: Secondary | ICD-10-CM | POA: Diagnosis not present

## 2012-04-10 DIAGNOSIS — I10 Essential (primary) hypertension: Secondary | ICD-10-CM | POA: Insufficient documentation

## 2012-04-10 DIAGNOSIS — Z880 Allergy status to penicillin: Secondary | ICD-10-CM | POA: Insufficient documentation

## 2012-04-10 DIAGNOSIS — N390 Urinary tract infection, site not specified: Secondary | ICD-10-CM | POA: Insufficient documentation

## 2012-04-10 DIAGNOSIS — E785 Hyperlipidemia, unspecified: Secondary | ICD-10-CM | POA: Insufficient documentation

## 2012-04-10 DIAGNOSIS — R5381 Other malaise: Secondary | ICD-10-CM | POA: Insufficient documentation

## 2012-04-10 DIAGNOSIS — M810 Age-related osteoporosis without current pathological fracture: Secondary | ICD-10-CM | POA: Insufficient documentation

## 2012-04-10 DIAGNOSIS — R531 Weakness: Secondary | ICD-10-CM

## 2012-04-10 HISTORY — DX: Age-related osteoporosis without current pathological fracture: M81.0

## 2012-04-10 LAB — COMPREHENSIVE METABOLIC PANEL
AST: 36 U/L (ref 0–37)
Albumin: 4 g/dL (ref 3.5–5.2)
Calcium: 9.7 mg/dL (ref 8.4–10.5)
Creatinine, Ser: 0.7 mg/dL (ref 0.50–1.10)
Total Protein: 7.1 g/dL (ref 6.0–8.3)

## 2012-04-10 LAB — CBC WITH DIFFERENTIAL/PLATELET
Basophils Absolute: 0 10*3/uL (ref 0.0–0.1)
Basophils Relative: 0 % (ref 0–1)
Eosinophils Absolute: 0.1 10*3/uL (ref 0.0–0.7)
Eosinophils Relative: 1 % (ref 0–5)
HCT: 37.6 % (ref 36.0–46.0)
MCHC: 33 g/dL (ref 30.0–36.0)
Monocytes Absolute: 0.6 10*3/uL (ref 0.1–1.0)
Neutro Abs: 8 10*3/uL — ABNORMAL HIGH (ref 1.7–7.7)
RDW: 13.3 % (ref 11.5–15.5)

## 2012-04-10 LAB — URINALYSIS, ROUTINE W REFLEX MICROSCOPIC
Glucose, UA: NEGATIVE mg/dL
Ketones, ur: NEGATIVE mg/dL
Protein, ur: NEGATIVE mg/dL

## 2012-04-10 LAB — URINE MICROSCOPIC-ADD ON

## 2012-04-10 MED ORDER — SODIUM CHLORIDE 0.9 % IV BOLUS (SEPSIS)
1000.0000 mL | Freq: Once | INTRAVENOUS | Status: AC
  Administered 2012-04-10: 1000 mL via INTRAVENOUS

## 2012-04-10 MED ORDER — CEPHALEXIN 250 MG PO CAPS: 250.0000 mg | ORAL_CAPSULE | Freq: Four times a day (QID) | ORAL | Status: DC

## 2012-04-10 NOTE — ED Notes (Signed)
MD at bedside. 

## 2012-04-10 NOTE — ED Notes (Signed)
Pt reports generalized weakness x 4 days and diarrhea Friday.  States this am she continues to feel weak and lightheaded.

## 2012-04-10 NOTE — ED Provider Notes (Signed)
History     CSN: [id number]  Arrival date & time 04/10/12  1029   First MD Initiated Contact with Patient 04/10/12 1136      Chief Complaint  Patient presents with  . Weakness  . Diarrhea  . Nausea    (Consider location/radiation/quality/duration/timing/severity/associated sxs/prior treatment) HPI Comments: Patient presents complaining of feeling weak and having no energy for the past few days.  She denies any pain or discomfort.  She does report some loose stool several days ago but nothing bloody or black and no abd pain.  She says she felt this way once before when she was diagnosed with a uti.  She has a history of stent last year but these symptoms are completely different than what she had experienced then.    Patient is a 66 y.o. female presenting with weakness and diarrhea. The history is provided by the patient.  Weakness The primary symptoms include dizziness. Primary symptoms do not include fever. The symptoms began 2 days ago. The symptoms are worsening. The neurological symptoms are diffuse. Context: none.  Dizziness also occurs with weakness.  Additional symptoms include weakness.  Diarrhea The primary symptoms include fatigue and diarrhea. Primary symptoms do not include fever.    Past Medical History  Diagnosis Date  . Premature ventricular contractions   . Coronary atherosclerosis of native coronary artery   . HLD (hyperlipidemia)   . HTN (hypertension)   . Hiatal hernia   . Hypothyroidism   . Osteoporosis     Past Surgical History  Procedure Date  . Hysterectomy (other)   . Tonsillectomy   . Appendectomy   . Cardiac catheterization   . Coronary angioplasty with stent placement   . Breast surgery     Family History  Problem Relation Age of Onset  . Heart attack Brother 40    History  Substance Use Topics  . Smoking status: Never Smoker   . Smokeless tobacco: Not on file   Comment: tobacco use- no  . Alcohol Use: No    OB History    Grav  Para Term Preterm Abortions TAB SAB Ect Mult Living                  Review of Systems  Constitutional: Positive for fatigue. Negative for fever.  Gastrointestinal: Positive for diarrhea.  Neurological: Positive for dizziness and weakness.  All other systems reviewed and are negative.    Allergies  Atorvastatin; Codeine; Flagyl; Metronidazole; Penicillins; and Simvastatin  Home Medications   Current Outpatient Rx  Name Route Sig Dispense Refill  . ACETAMINOPHEN 500 MG PO TABS Oral Take 1,000 mg by mouth every 6 (six) hours as needed.      . ASPIRIN 81 MG PO TBEC Oral Take 81 mg by mouth daily.      Marland Kitchen CALCIUM CARBONATE 1500 MG PO TABS Oral Take by mouth 2 (two) times daily.      Marland Kitchen VITAMIN D3 1000 UNITS PO CAPS Oral Take by mouth daily.      Marland Kitchen ESOMEPRAZOLE MAGNESIUM 40 MG PO CPDR Oral Take 40 mg by mouth daily.      Marland Kitchen LEVOTHYROXINE SODIUM 50 MCG PO TABS Oral Take 50 mcg by mouth daily.      Marland Kitchen METOPROLOL TARTRATE 25 MG PO TABS Oral Take 0.5 tablets (12.5 mg total) by mouth 2 (two) times daily. 30 tablet 11  . MULTIVITAL PO TABS Oral Take 1 tablet by mouth daily.      Marland Kitchen NITROGLYCERIN 0.4 MG  SL SUBL Sublingual Place 1 tablet (0.4 mg total) under the tongue every 5 (five) minutes as needed for chest pain. 25 tablet 11  . PRASUGREL HCL 10 MG PO TABS Oral Take 1 tablet (10 mg total) by mouth daily. 30 tablet 11  . ROSUVASTATIN CALCIUM 20 MG PO TABS Oral Take 1 tablet (20 mg total) by mouth daily. 30 tablet 11  . VALSARTAN-HYDROCHLOROTHIAZIDE 160-12.5 MG PO TABS Oral Take 1 tablet by mouth daily.        BP 161/86  Pulse 77  Temp 99 F (37.2 C) (Oral)  Resp 18  Ht 5' 2.5" (1.588 m)  Wt 140 lb (63.504 kg)  BMI 25.20 kg/m2  SpO2 100%  Physical Exam  Nursing note and vitals reviewed. Constitutional: She is oriented to person, place, and time. She appears well-developed and well-nourished. No distress.  HENT:  Head: Normocephalic and atraumatic.  Neck: Normal range of motion. Neck  supple.  Cardiovascular: Normal rate and regular rhythm.  Exam reveals no gallop and no friction rub.   No murmur heard. Pulmonary/Chest: Effort normal and breath sounds normal. No respiratory distress. She has no wheezes.  Abdominal: Soft. Bowel sounds are normal. She exhibits no distension. There is no tenderness.  Musculoskeletal: Normal range of motion.  Neurological: She is alert and oriented to person, place, and time.  Skin: Skin is warm and dry. She is not diaphoretic.    ED Course  Procedures (including critical care time)   Labs Reviewed  URINALYSIS, ROUTINE W REFLEX MICROSCOPIC  CBC WITH DIFFERENTIAL  COMPREHENSIVE METABOLIC PANEL  TROPONIN I   No results found.   No diagnosis found.   Date: 04/10/2012  Rate: 75  Rhythm: normal sinus rhythm  QRS Axis: normal  Intervals: normal  ST/T Wave abnormalities: normal  Conduction Disutrbances:none  Narrative Interpretation:   Old EKG Reviewed: unchanged    MDM  The patient presents here with complaints of weakness.  The workup is essentially unremarkable with the exception of a uti.  She tells me this is how she felt last year with the same.  I have found nothing else to explain the weakness.  She is already on keflex which was started today for a dental procedure.  I will just make sure she continues this.  To return prn if there are any problems.          Geoffery Lyons, MD 04/10/12 [phone]

## 2012-04-17 DIAGNOSIS — Z23 Encounter for immunization: Secondary | ICD-10-CM | POA: Diagnosis not present

## 2012-04-17 DIAGNOSIS — N390 Urinary tract infection, site not specified: Secondary | ICD-10-CM | POA: Diagnosis not present

## 2012-04-17 DIAGNOSIS — M5430 Sciatica, unspecified side: Secondary | ICD-10-CM | POA: Diagnosis not present

## 2012-05-24 DIAGNOSIS — I10 Essential (primary) hypertension: Secondary | ICD-10-CM | POA: Diagnosis not present

## 2012-05-24 DIAGNOSIS — E559 Vitamin D deficiency, unspecified: Secondary | ICD-10-CM | POA: Diagnosis not present

## 2012-05-24 DIAGNOSIS — K219 Gastro-esophageal reflux disease without esophagitis: Secondary | ICD-10-CM | POA: Diagnosis not present

## 2012-05-24 DIAGNOSIS — E7800 Pure hypercholesterolemia, unspecified: Secondary | ICD-10-CM | POA: Diagnosis not present

## 2012-05-24 DIAGNOSIS — E039 Hypothyroidism, unspecified: Secondary | ICD-10-CM | POA: Diagnosis not present

## 2012-05-25 ENCOUNTER — Encounter (HOSPITAL_BASED_OUTPATIENT_CLINIC_OR_DEPARTMENT_OTHER): Payer: Self-pay | Admitting: *Deleted

## 2012-05-25 ENCOUNTER — Emergency Department (HOSPITAL_BASED_OUTPATIENT_CLINIC_OR_DEPARTMENT_OTHER): Payer: Medicare Other

## 2012-05-25 ENCOUNTER — Emergency Department (HOSPITAL_BASED_OUTPATIENT_CLINIC_OR_DEPARTMENT_OTHER)
Admission: EM | Admit: 2012-05-25 | Discharge: 2012-05-25 | Disposition: A | Discharge status: Home / Self Care | Payer: Medicare Other | Attending: Emergency Medicine | Admitting: Emergency Medicine

## 2012-05-25 DIAGNOSIS — I4949 Other premature depolarization: Secondary | ICD-10-CM | POA: Diagnosis not present

## 2012-05-25 DIAGNOSIS — Z79899 Other long term (current) drug therapy: Secondary | ICD-10-CM | POA: Insufficient documentation

## 2012-05-25 DIAGNOSIS — R091 Pleurisy: Secondary | ICD-10-CM | POA: Diagnosis not present

## 2012-05-25 DIAGNOSIS — I2510 Atherosclerotic heart disease of native coronary artery without angina pectoris: Secondary | ICD-10-CM | POA: Diagnosis not present

## 2012-05-25 DIAGNOSIS — M810 Age-related osteoporosis without current pathological fracture: Secondary | ICD-10-CM | POA: Diagnosis not present

## 2012-05-25 DIAGNOSIS — R002 Palpitations: Secondary | ICD-10-CM | POA: Diagnosis not present

## 2012-05-25 DIAGNOSIS — E039 Hypothyroidism, unspecified: Secondary | ICD-10-CM | POA: Insufficient documentation

## 2012-05-25 DIAGNOSIS — R110 Nausea: Secondary | ICD-10-CM | POA: Insufficient documentation

## 2012-05-25 DIAGNOSIS — I10 Essential (primary) hypertension: Secondary | ICD-10-CM | POA: Insufficient documentation

## 2012-05-25 DIAGNOSIS — K449 Diaphragmatic hernia without obstruction or gangrene: Secondary | ICD-10-CM | POA: Diagnosis not present

## 2012-05-25 DIAGNOSIS — E785 Hyperlipidemia, unspecified: Secondary | ICD-10-CM | POA: Insufficient documentation

## 2012-05-25 LAB — TROPONIN I
Troponin I: <0.3 ng/mL (ref <0.30)
Troponin I: <0.3 ng/mL (ref <0.30)

## 2012-05-25 NOTE — ED Provider Notes (Signed)
History     CSN: [id number]  Arrival date & time 05/25/12  1219   First MD Initiated Contact with Patient 05/25/12 1234      Chief Complaint  Patient presents with  . heart beat feels fast     (Consider location/radiation/quality/duration/timing/severity/associated sxs/prior treatment) HPI Pt reports while at lunch today she started having palpitations. States she felt like her heart was beating fast and hard. She felt nauseated but no CP or SOB. She states she felt dizzy when she stood up but did not lose consciousness. She was brought to the ED via husband and symptoms have since resolved. She feels much better now.  She has history of CAD s/p stent but cath in Jan 2012 neg for any further critical disease.  Past Medical History  Diagnosis Date  . Premature ventricular contractions   . Coronary atherosclerosis of native coronary artery   . HLD (hyperlipidemia)   . HTN (hypertension)   . Hiatal hernia   . Hypothyroidism   . Osteoporosis     Past Surgical History  Procedure Date  . Hysterectomy (other)   . Tonsillectomy   . Appendectomy   . Cardiac catheterization   . Coronary angioplasty with stent placement   . Breast surgery     Family History  Problem Relation Age of Onset  . Heart attack Brother 40    History  Substance Use Topics  . Smoking status: Never Smoker   . Smokeless tobacco: Not on file   Comment: tobacco use- no  . Alcohol Use: No    OB History    Grav Para Term Preterm Abortions TAB SAB Ect Mult Living                  Review of Systems All other systems reviewed and are negative except as noted in HPI.   Allergies  Atorvastatin; Codeine; Flagyl; Metronidazole; Penicillins; and Simvastatin  Home Medications   Current Outpatient Rx  Name Route Sig Dispense Refill  . ACETAMINOPHEN 500 MG PO TABS Oral Take 1,000 mg by mouth every 6 (six) hours as needed.      . ASPIRIN 81 MG PO TBEC Oral Take 81 mg by mouth daily.      Marland Kitchen CALCIUM  CARBONATE 1500 MG PO TABS Oral Take by mouth 2 (two) times daily.      . CEPHALEXIN 250 MG PO CAPS Oral Take 1 capsule (250 mg total) by mouth 4 (four) times daily. 20 capsule 0  . VITAMIN D3 1000 UNITS PO CAPS Oral Take by mouth daily.      Marland Kitchen ESOMEPRAZOLE MAGNESIUM 40 MG PO CPDR Oral Take 40 mg by mouth daily.      Marland Kitchen LEVOTHYROXINE SODIUM 50 MCG PO TABS Oral Take 50 mcg by mouth daily.      Marland Kitchen METOPROLOL TARTRATE 25 MG PO TABS Oral Take 0.5 tablets (12.5 mg total) by mouth 2 (two) times daily. 30 tablet 11  . MULTIVITAL PO TABS Oral Take 1 tablet by mouth daily.      Marland Kitchen NITROGLYCERIN 0.4 MG SL SUBL Sublingual Place 1 tablet (0.4 mg total) under the tongue every 5 (five) minutes as needed for chest pain. 25 tablet 11  . PRASUGREL HCL 10 MG PO TABS Oral Take 1 tablet (10 mg total) by mouth daily. 30 tablet 11  . ROSUVASTATIN CALCIUM 20 MG PO TABS Oral Take 1 tablet (20 mg total) by mouth daily. 30 tablet 11  . VALSARTAN-HYDROCHLOROTHIAZIDE 160-12.5 MG PO TABS Oral  Take 1 tablet by mouth daily.        BP 169/77  Pulse 81  Temp 98.3 F (36.8 C) (Oral)  Resp 18  SpO2 99%  Physical Exam  Nursing note and vitals reviewed. Constitutional: She is oriented to person, place, and time. She appears well-developed and well-nourished.  HENT:  Head: Normocephalic and atraumatic.  Eyes: EOM are normal. Pupils are equal, round, and reactive to light.  Neck: Normal range of motion. Neck supple.  Cardiovascular: Normal rate, normal heart sounds and intact distal pulses.   Pulmonary/Chest: Effort normal and breath sounds normal.  Abdominal: Bowel sounds are normal. She exhibits no distension. There is no tenderness.  Musculoskeletal: Normal range of motion. She exhibits no edema and no tenderness.  Neurological: She is alert and oriented to person, place, and time. She has normal strength. No cranial nerve deficit or sensory deficit.  Skin: Skin is warm and dry. No rash noted.  Psychiatric: She has a  normal mood and affect.    ED Course  Procedures (including critical care time)   Labs Reviewed  TROPONIN I  TROPONIN I   Dg Chest 2 View  05/25/2012  *RADIOLOGY REPORT*  Clinical Data: Weakness.  Palpitations.  CHEST - 2 VIEW  Comparison: Chest x-ray 06/16/2011.  Findings: Lung volumes are normal.  No consolidative airspace disease.  No pleural effusions.  No pneumothorax.  No pulmonary nodule or mass noted.  Pulmonary vasculature and the cardiomediastinal silhouette are within normal limits. Atherosclerosis in the thoracic aorta. Bilateral apical pleuroparenchymal thickening (right greater than left) is unchanged, most compatible with mild scarring.  IMPRESSION: 1. No radiographic evidence of acute cardiopulmonary disease. 2.  Atherosclerosis   Original Report Authenticated By: Trudie Reed, M.D.      No diagnosis found.    MDM   Date: 05/25/2012  Rate: 82  Rhythm: normal sinus rhythm  QRS Axis: normal  Intervals: normal  ST/T Wave abnormalities: nonspecific ST/T changes  Conduction Disutrbances:none  Narrative Interpretation:   Old EKG Reviewed: unchanged  Labs drawn yesterday at routine PCP appointment reviewed and unremarkable include CBC, CMP, TSH, Lipid panel and Vit D level. Will check CXR and Trop. Observe in the ED.   3:04 PM Pt continues to be asymptomatic here. No dysrhythmias seen on the monitor. Trop neg x 2 Plan for discharge home. Discussed close follow up, [facility] to call tomorrow for appointment. Pt advised to call EMS if symptoms returned to hopefully catch rhythm on monitor while symptomatic.      Keats Kingry B. Bernette Mayers, MD 05/25/12 1505

## 2012-05-25 NOTE — ED Notes (Signed)
Patient transported to X-ray 

## 2012-05-25 NOTE — ED Notes (Signed)
Pt returned from radiology.

## 2012-05-25 NOTE — ED Notes (Signed)
Eating lunch started feeling hot and like her heart was beating too hard states "it felt like it was beating so hard it was vibrating my whole body" denies pain in chest reports some pain at left collar bone area

## 2012-06-02 ENCOUNTER — Ambulatory Visit (INDEPENDENT_AMBULATORY_CARE_PROVIDER_SITE_OTHER): Payer: Medicare Other | Admitting: Nurse Practitioner

## 2012-06-02 ENCOUNTER — Encounter (INDEPENDENT_AMBULATORY_CARE_PROVIDER_SITE_OTHER): Payer: Medicare Other

## 2012-06-02 ENCOUNTER — Encounter: Payer: Self-pay | Admitting: Nurse Practitioner

## 2012-06-02 VITALS — BP 170/88 | HR 68 | Ht 63.0 in | Wt 140.8 lb

## 2012-06-02 DIAGNOSIS — R002 Palpitations: Secondary | ICD-10-CM

## 2012-06-02 MED ORDER — METOPROLOL TARTRATE 25 MG PO TABS: 25.0000 mg | ORAL_TABLET | Freq: Two times a day (BID) | ORAL | Status: DC

## 2012-06-02 NOTE — Progress Notes (Signed)
Izell Revere Date of Birth: 16-Jan-1946 Medical Record #[id number]  History of Present Illness: [name] is seen today for a post ER visit. She is seen for Dr. Daleen Squibb. She has known CAD with prior DES to the marginal branch of the LCX back in January of 2012. Other issues include hypothyroidism, palpitations, HTN and HLD.   She was last here in January. Felt to be doing ok. She was in the ER in September with fatigue and diarrhea. In the ER again on Halloween because of palpitations that started while eating dinner. She was not able to finish her meal. Was drinking caffinated soda. Likes chocolate as well. Her evaluation there was unremarkable.   She comes back today. She is here with her husband. She continues to note this "pulsing heart beat". No real skips but feels her heart going fast and beating hard. It is making her anxious. She feels lightheaded and feels like she could pass out. Her spells have persisted over this past week and occur every day. No chest pain. No angina reported. She had labs drawn just prior to this ER visit which were all normal. Normal TSH as well. She notes her blood pressure is usually better at home. Some degree of white coat syndrome. Does like chocolate. Has had more stress with several family members being sick.   Current Outpatient Prescriptions on File Prior to Visit  Medication Sig Dispense Refill  . acetaminophen (TYLENOL) 500 MG tablet Take 1,000 mg by mouth every 6 (six) hours as needed.        Marland Kitchen aspirin (ASPIR-81) 81 MG EC tablet Take 81 mg by mouth daily.        . Calcium Carbonate (CALTRATE 600) 1500 MG TABS Take by mouth 2 (two) times daily.        . Cholecalciferol (VITAMIN D3) 1000 UNITS CAPS Take by mouth daily.        Marland Kitchen esomeprazole (NEXIUM) 40 MG capsule Take 40 mg by mouth daily.        Marland Kitchen levothyroxine (UNITHROID) 50 MCG tablet Take 50 mcg by mouth daily.        . Multiple Vitamins-Minerals (MULTIVITAL) tablet Take 1 tablet by mouth daily.          . nitroGLYCERIN (NITROSTAT) 0.4 MG SL tablet Place 1 tablet (0.4 mg total) under the tongue every 5 (five) minutes as needed for chest pain.  25 tablet  11  . prasugrel (EFFIENT) 10 MG TABS Take 1 tablet (10 mg total) by mouth daily.  30 tablet  11  . rosuvastatin (CRESTOR) 20 MG tablet Take 1 tablet (20 mg total) by mouth daily.  30 tablet  11  . valsartan-hydrochlorothiazide (DIOVAN HCT) 160-12.5 MG per tablet Take 1 tablet by mouth daily.        . [DISCONTINUED] metoprolol tartrate (LOPRESSOR) 25 MG tablet Take 0.5 tablets (12.5 mg total) by mouth 2 (two) times daily.  30 tablet  11    Allergies  Allergen Reactions  . Atorvastatin     REACTION: myaligia  . Codeine   . Flagyl (Metronidazole Hcl)   . Metronidazole   . Penicillins   . Simvastatin     REACTION: myalgia    Past Medical History  Diagnosis Date  . Premature ventricular contractions   . Coronary atherosclerosis of native coronary artery     prior DES to the marginal of the LCX in January of 2012  . HLD (hyperlipidemia)   . HTN (hypertension)   .  Hiatal hernia   . Hypothyroidism   . Osteoporosis   . Palpitations     Past Surgical History  Procedure Date  . Hysterectomy (other)   . Tonsillectomy   . Appendectomy   . Cardiac catheterization   . Coronary angioplasty with stent placement   . Breast surgery     History  Smoking status  . Never Smoker   Smokeless tobacco  . Not on file    Comment: tobacco use- no    History  Alcohol Use No    Family History  Problem Relation Age of Onset  . Heart attack Brother 40    Review of Systems: The review of systems is per the HPI.  All other systems were reviewed and are negative.  Physical Exam: BP 170/88  Pulse 68  Ht 5\' 3"  (1.6 m)  Wt 140 lb 12.8 oz (63.866 kg)  BMI 24.94 kg/m2 Patient is very pleasant and in no acute distress. Skin is warm and dry. Color is normal.  HEENT is unremarkable. Normocephalic/atraumatic. PERRL. Sclera are nonicteric.  Neck is supple. No masses. No JVD. Lungs are clear. Cardiac exam shows a regular rate and rhythm. Soft murmur noted. Abdomen is soft. Extremities are without edema. Gait and ROM are intact. No gross neurologic deficits noted.   LABORATORY DATA: Reviewed from her PCP. Chemistries were normal. TSH was normal.     Chemistry      Component Value Date/Time   NA 140 04/10/2012 1155   K 3.6 04/10/2012 1155   CL 101 04/10/2012 1155   CO2 29 04/10/2012 1155   BUN 11 04/10/2012 1155   CREATININE 0.70 04/10/2012 1155      Component Value Date/Time   CALCIUM 9.7 04/10/2012 1155   ALKPHOS 100 04/10/2012 1155   AST 36 04/10/2012 1155   ALT 33 04/10/2012 1155   BILITOT 0.7 04/10/2012 1155     Lab Results  Component Value Date   CKTOTAL 66 06/16/2011   CKMB 2.7 06/16/2011   TROPONINI <0.30 05/25/2012   Dg Chest 2 View  05/25/2012  *RADIOLOGY REPORT*  Clinical Data: Weakness.  Palpitations.  CHEST - 2 VIEW  Comparison: Chest x-ray 06/16/2011.  Findings: Lung volumes are normal.  No consolidative airspace disease.  No pleural effusions.  No pneumothorax.  No pulmonary nodule or mass noted.  Pulmonary vasculature and the cardiomediastinal silhouette are within normal limits. Atherosclerosis in the thoracic aorta. Bilateral apical pleuroparenchymal thickening (right greater than left) is unchanged, most compatible with mild scarring.  IMPRESSION: 1. No radiographic evidence of acute cardiopulmonary disease. 2.  Atherosclerosis   Original Report Authenticated By: Trudie Reed, M.D.    Echo Study Conclusions 2012  - Left ventricle: Possible inferobasal hypokinesis. The cavity size was normal. Wall thickness was normal. Systolic function was normal. The estimated ejection fraction was in the range of 55% to 60%. - Mitral valve: Mild regurgitation.   Assessment / Plan:  1. Palpitations - this is happening every day. Will update her echo. Will place a 24 hour Holter. I have asked her to cut back on her  caffeine. I will see her back in 2 weeks for discussion.   2. HTN - blood pressure is up. She has not had all of her medicines today yet. I have asked her to monitor at home.   3. CAD - no recurrent angina reported.   Patient is agreeable to this plan and will call if any problems develop in the interim.

## 2012-06-02 NOTE — Patient Instructions (Addendum)
We are going to put on a heart monitor for 24 hours  We are going to update your ultrasound of your heart - will try to do on Monday  Cut back your caffeine  On Saturday evening, you can start taking a whole tablet of your Metoprolol two times a day. I did send you a prescription to the drug store.  Monitor your blood pressure at home and keep a diary  I will see you in 2 weeks.  Call the [facility] office at [phone] if you have any questions, problems or concerns.

## 2012-06-05 DIAGNOSIS — Z1231 Encounter for screening mammogram for malignant neoplasm of breast: Secondary | ICD-10-CM | POA: Diagnosis not present

## 2012-06-06 DIAGNOSIS — L570 Actinic keratosis: Secondary | ICD-10-CM | POA: Diagnosis not present

## 2012-06-06 DIAGNOSIS — L821 Other seborrheic keratosis: Secondary | ICD-10-CM | POA: Diagnosis not present

## 2012-06-07 ENCOUNTER — Ambulatory Visit (HOSPITAL_COMMUNITY): Payer: Medicare Other | Attending: Cardiovascular Disease | Admitting: Radiology

## 2012-06-07 DIAGNOSIS — I2510 Atherosclerotic heart disease of native coronary artery without angina pectoris: Secondary | ICD-10-CM | POA: Diagnosis not present

## 2012-06-07 DIAGNOSIS — R002 Palpitations: Secondary | ICD-10-CM | POA: Insufficient documentation

## 2012-06-07 DIAGNOSIS — N6019 Diffuse cystic mastopathy of unspecified breast: Secondary | ICD-10-CM | POA: Diagnosis not present

## 2012-06-07 DIAGNOSIS — I10 Essential (primary) hypertension: Secondary | ICD-10-CM | POA: Insufficient documentation

## 2012-06-07 DIAGNOSIS — E785 Hyperlipidemia, unspecified: Secondary | ICD-10-CM | POA: Insufficient documentation

## 2012-06-07 NOTE — Progress Notes (Signed)
Echocardiogram performed.  

## 2012-06-09 ENCOUNTER — Telehealth: Payer: Self-pay | Admitting: *Deleted

## 2012-06-09 NOTE — Telephone Encounter (Signed)
Message copied by Debbe Bales on Fri Jun 09, 2012  9:51 AM ------      Message from: Rosalio Macadamia      Created: Thu Jun 08, 2012  7:41 AM       Ok to report. Echo is satisfactory. Pumping function is normal. Valves ok.

## 2012-06-09 NOTE — Telephone Encounter (Signed)
t/w pt pharmacy swithched form diovan hct to losartan than swithched differnet losartan havent felt well different manufactor feeling better after switched to old med and felt better than went back to the new med and felt horrible so [name], NP said pt should probably only be on diovan ( brand name)

## 2012-06-12 ENCOUNTER — Encounter (HOSPITAL_BASED_OUTPATIENT_CLINIC_OR_DEPARTMENT_OTHER): Payer: Self-pay | Admitting: Family Medicine

## 2012-06-12 ENCOUNTER — Emergency Department (HOSPITAL_BASED_OUTPATIENT_CLINIC_OR_DEPARTMENT_OTHER)
Admission: EM | Admit: 2012-06-12 | Discharge: 2012-06-12 | Disposition: A | Discharge status: Home / Self Care | Payer: Medicare Other | Attending: Emergency Medicine | Admitting: Emergency Medicine

## 2012-06-12 DIAGNOSIS — R079 Chest pain, unspecified: Secondary | ICD-10-CM | POA: Insufficient documentation

## 2012-06-12 DIAGNOSIS — Z8719 Personal history of other diseases of the digestive system: Secondary | ICD-10-CM | POA: Diagnosis not present

## 2012-06-12 DIAGNOSIS — I10 Essential (primary) hypertension: Secondary | ICD-10-CM | POA: Insufficient documentation

## 2012-06-12 DIAGNOSIS — Z8739 Personal history of other diseases of the musculoskeletal system and connective tissue: Secondary | ICD-10-CM | POA: Diagnosis not present

## 2012-06-12 DIAGNOSIS — E039 Hypothyroidism, unspecified: Secondary | ICD-10-CM | POA: Insufficient documentation

## 2012-06-12 DIAGNOSIS — Z79899 Other long term (current) drug therapy: Secondary | ICD-10-CM | POA: Diagnosis not present

## 2012-06-12 DIAGNOSIS — R7309 Other abnormal glucose: Secondary | ICD-10-CM | POA: Diagnosis not present

## 2012-06-12 DIAGNOSIS — R5383 Other fatigue: Secondary | ICD-10-CM | POA: Diagnosis not present

## 2012-06-12 DIAGNOSIS — R531 Weakness: Secondary | ICD-10-CM

## 2012-06-12 DIAGNOSIS — Z9861 Coronary angioplasty status: Secondary | ICD-10-CM | POA: Diagnosis not present

## 2012-06-12 DIAGNOSIS — N390 Urinary tract infection, site not specified: Secondary | ICD-10-CM | POA: Diagnosis not present

## 2012-06-12 DIAGNOSIS — R0602 Shortness of breath: Secondary | ICD-10-CM | POA: Diagnosis not present

## 2012-06-12 DIAGNOSIS — E785 Hyperlipidemia, unspecified: Secondary | ICD-10-CM | POA: Diagnosis not present

## 2012-06-12 DIAGNOSIS — Z7982 Long term (current) use of aspirin: Secondary | ICD-10-CM | POA: Diagnosis not present

## 2012-06-12 DIAGNOSIS — Z8679 Personal history of other diseases of the circulatory system: Secondary | ICD-10-CM | POA: Insufficient documentation

## 2012-06-12 DIAGNOSIS — R5381 Other malaise: Secondary | ICD-10-CM | POA: Diagnosis not present

## 2012-06-12 LAB — URINALYSIS, ROUTINE W REFLEX MICROSCOPIC
Bilirubin Urine: NEGATIVE
Nitrite: NEGATIVE
Specific Gravity, Urine: 1.015 (ref 1.005–1.030)
Urobilinogen, UA: 0.2 mg/dL (ref 0.0–1.0)

## 2012-06-12 LAB — CBC
HCT: 36.4 % (ref 36.0–46.0)
MCV: 85.4 fL (ref 78.0–100.0)
RBC: 4.26 MIL/uL (ref 3.87–5.11)
WBC: 8.9 10*3/uL (ref 4.0–10.5)

## 2012-06-12 LAB — COMPREHENSIVE METABOLIC PANEL
AST: 33 U/L (ref 0–37)
Albumin: 3.9 g/dL (ref 3.5–5.2)
Alkaline Phosphatase: 91 U/L (ref 39–117)
BUN: 11 mg/dL (ref 6–23)
Potassium: 3.4 mEq/L — ABNORMAL LOW (ref 3.5–5.1)
Total Protein: 6.9 g/dL (ref 6.0–8.3)

## 2012-06-12 LAB — URINE MICROSCOPIC-ADD ON

## 2012-06-12 MED ORDER — CEPHALEXIN 250 MG PO CAPS
250.0000 mg | ORAL_CAPSULE | Freq: Once | ORAL | Status: AC
  Administered 2012-06-12: 250 mg via ORAL
  Filled 2012-06-12: qty 1

## 2012-06-12 MED ORDER — POTASSIUM CHLORIDE CRYS ER 20 MEQ PO TBCR
40.0000 meq | EXTENDED_RELEASE_TABLET | Freq: Once | ORAL | Status: AC
  Administered 2012-06-12: 40 meq via ORAL
  Filled 2012-06-12: qty 2

## 2012-06-12 MED ORDER — CEPHALEXIN 250 MG PO CAPS: 250.0000 mg | ORAL_CAPSULE | Freq: Four times a day (QID) | ORAL | Status: DC

## 2012-06-12 NOTE — ED Provider Notes (Signed)
History     CSN: [id number]  Arrival date & time 06/12/12  5621   First MD Initiated Contact with Patient 06/12/12 0902      Chief Complaint  Patient presents with  . Weakness    (Consider location/radiation/quality/duration/timing/severity/associated sxs/prior treatment) HPI Complains of generalized weakness progressively worsening for the past 2 weeks worse with exertion. Denies fever patient admits to vague chest discomfort this morning lasting 20 minutes resolved spontaneously without treatment. Did not feel like angina that she's had in the past Recent seen here 05/25/2012 for same complaint had subsequent followup with [facility] cardiology. Admits to occasional shortness of breath none presently. She is presently asymptomatic except for generalized weakness. No treatment prior to coming here Past Medical History  Diagnosis Date  . Premature ventricular contractions   . Coronary atherosclerosis of native coronary artery     prior DES to the marginal of the LCX in January of 2012  . HLD (hyperlipidemia)   . HTN (hypertension)   . Hiatal hernia   . Hypothyroidism   . Osteoporosis   . Palpitations     Past Surgical History  Procedure Date  . Hysterectomy (other)   . Tonsillectomy   . Appendectomy   . Cardiac catheterization   . Coronary angioplasty with stent placement   . Breast surgery     Family History  Problem Relation Age of Onset  . Heart attack Brother 40    History  Substance Use Topics  . Smoking status: Never Smoker   . Smokeless tobacco: Not on file     Comment: tobacco use- no  . Alcohol Use: No    OB History    Grav Para Term Preterm Abortions TAB SAB Ect Mult Living                  Review of Systems  HENT: Negative.   Respiratory: Positive for shortness of breath.   Cardiovascular: Positive for chest pain.  Gastrointestinal: Negative.   Musculoskeletal: Negative.   Skin: Negative.   Neurological: Positive for weakness.    Hematological: Negative.   Psychiatric/Behavioral: Negative.   All other systems reviewed and are negative.    Allergies  Atorvastatin; Codeine; Flagyl; Metronidazole; Penicillins; and Simvastatin  Home Medications   Current Outpatient Rx  Name  Route  Sig  Dispense  Refill  . ACETAMINOPHEN 500 MG PO TABS   Oral   Take 1,000 mg by mouth every 6 (six) hours as needed.           . ASPIRIN 81 MG PO TBEC   Oral   Take 81 mg by mouth daily.           Marland Kitchen CALCIUM CARBONATE 1500 MG PO TABS   Oral   Take by mouth 2 (two) times daily.           . CEPHALEXIN 250 MG PO CAPS   Oral   Take 250 mg by mouth 4 (four) times daily. For dental procedures         . VITAMIN D3 1000 UNITS PO CAPS   Oral   Take by mouth daily.           Marland Kitchen ESOMEPRAZOLE MAGNESIUM 40 MG PO CPDR   Oral   Take 40 mg by mouth daily.           Marland Kitchen LEVOTHYROXINE SODIUM 50 MCG PO TABS   Oral   Take 50 mcg by mouth daily.           Marland Kitchen  METOPROLOL TARTRATE 25 MG PO TABS   Oral   Take 1 tablet (25 mg total) by mouth 2 (two) times daily.   60 tablet   11   . MULTIVITAL PO TABS   Oral   Take 1 tablet by mouth daily.           Marland Kitchen NITROGLYCERIN 0.4 MG SL SUBL   Sublingual   Place 1 tablet (0.4 mg total) under the tongue every 5 (five) minutes as needed for chest pain.   25 tablet   11   . PRASUGREL HCL 10 MG PO TABS   Oral   Take 1 tablet (10 mg total) by mouth daily.   30 tablet   11   . ROSUVASTATIN CALCIUM 20 MG PO TABS   Oral   Take 1 tablet (20 mg total) by mouth daily.   30 tablet   11   . VALSARTAN-HYDROCHLOROTHIAZIDE 160-12.5 MG PO TABS   Oral   Take 1 tablet by mouth daily.             BP 181/79  Pulse 81  Temp 98.9 F (37.2 C) (Oral)  Resp 16  Ht 5\' 3"  (1.6 m)  Wt 140 lb (63.504 kg)  BMI 24.80 kg/m2  SpO2 100%  Physical Exam  Nursing note and vitals reviewed. Constitutional: She appears well-developed and well-nourished.  HENT:  Head: Normocephalic and  atraumatic.  Eyes: Conjunctivae normal are normal. Pupils are equal, round, and reactive to light.  Neck: Neck supple. No tracheal deviation present. No thyromegaly present.  Cardiovascular: Normal rate and regular rhythm.   No murmur heard. Pulmonary/Chest: Effort normal and breath sounds normal.  Abdominal: Soft. Bowel sounds are normal. She exhibits no distension. There is no tenderness.  Musculoskeletal: Normal range of motion. She exhibits no edema and no tenderness.  Neurological: She is alert. Coordination normal.  Skin: Skin is warm and dry. No rash noted.  Psychiatric: She has a normal mood and affect.    ED Course  Procedures (including critical care time)   Labs Reviewed  URINALYSIS, ROUTINE W REFLEX MICROSCOPIC  COMPREHENSIVE METABOLIC PANEL  CBC  TROPONIN I   No results found.  Date: 06/12/2012  Rate: 75  Rhythm: normal sinus rhythm  QRS Axis: normal  Intervals: normal  ST/T Wave abnormalities: nonspecific T wave changes  Conduction Disutrbances:none  Narrative Interpretation:   Old EKG Reviewed: No significant change from 05/25/2012 as interpreted by me  No diagnosis found.  Results for orders placed during the hospital encounter of 06/12/12  URINALYSIS, ROUTINE W REFLEX MICROSCOPIC      Component Value Range   Color, Urine YELLOW  YELLOW   APPearance CLEAR  CLEAR   Specific Gravity, Urine 1.015  1.005 - 1.030   pH 7.0  5.0 - 8.0   Glucose, UA NEGATIVE  NEGATIVE mg/dL   Hgb urine dipstick TRACE (*) NEGATIVE   Bilirubin Urine NEGATIVE  NEGATIVE   Ketones, ur NEGATIVE  NEGATIVE mg/dL   Protein, ur NEGATIVE  NEGATIVE mg/dL   Urobilinogen, UA 0.2  0.0 - 1.0 mg/dL   Nitrite NEGATIVE  NEGATIVE   Leukocytes, UA MODERATE (*) NEGATIVE  COMPREHENSIVE METABOLIC PANEL      Component Value Range   Sodium 140  135 - 145 mEq/L   Potassium 3.4 (*) 3.5 - 5.1 mEq/L   Chloride 101  96 - 112 mEq/L   CO2 27  19 - 32 mEq/L   Glucose, Bld 150 (*) 70 - 99 mg/dL  BUN  11  6 - 23 mg/dL   Creatinine, Ser 4.09  0.50 - 1.10 mg/dL   Calcium 9.5  8.4 - 81.1 mg/dL   Total Protein 6.9  6.0 - 8.3 g/dL   Albumin 3.9  3.5 - 5.2 g/dL   AST 33  0 - 37 U/L   ALT 33  0 - 35 U/L   Alkaline Phosphatase 91  39 - 117 U/L   Total Bilirubin 0.9  0.3 - 1.2 mg/dL   GFR calc non Af Amer 88 (*) >90 mL/min   GFR calc Af Amer >90  >90 mL/min  CBC      Component Value Range   WBC 8.9  4.0 - 10.5 K/uL   RBC 4.26  3.87 - 5.11 MIL/uL   Hemoglobin 11.9 (*) 12.0 - 15.0 g/dL   HCT 91.4  78.2 - 95.6 %   MCV 85.4  78.0 - 100.0 fL   MCH 27.9  26.0 - 34.0 pg   MCHC 32.7  30.0 - 36.0 g/dL   RDW 21.3  08.6 - 57.8 %   Platelets 251  150 - 400 K/uL  TROPONIN I      Component Value Range   Troponin I <0.30  <0.30 ng/mL  URINE MICROSCOPIC-ADD ON      Component Value Range   Squamous Epithelial / LPF RARE  RARE   WBC, UA 7-10  <3 WBC/hpf   RBC / HPF 3-6  <3 RBC/hpf   Bacteria, UA FEW (*) RARE  TROPONIN I      Component Value Range   Troponin I <0.30  <0.30 ng/mL   Dg Chest 2 View  05/25/2012  *RADIOLOGY REPORT*  Clinical Data: Weakness.  Palpitations.  CHEST - 2 VIEW  Comparison: Chest x-ray 06/16/2011.  Findings: Lung volumes are normal.  No consolidative airspace disease.  No pleural effusions.  No pneumothorax.  No pulmonary nodule or mass noted.  Pulmonary vasculature and the cardiomediastinal silhouette are within normal limits. Atherosclerosis in the thoracic aorta. Bilateral apical pleuroparenchymal thickening (right greater than left) is unchanged, most compatible with mild scarring.  IMPRESSION: 1. No radiographic evidence of acute cardiopulmonary disease. 2.  Atherosclerosis   Original Report Authenticated By: Trudie Reed, M.D.     2 PM patient alert comfortable. States she was less weak upon getting up to walk after treatment in the emergency department MDM   Chest pain felt to be nonspecific in light of to negative markers nonacute EKG and atypical symptoms Spoke  with Dr. Tenny Craw plan urine for culture. Prescription Keflex. Patient to call Dr. Charlott Rakes office today to arrange to be seen this week. She is also encouraged to keep her scheduled appointment with [facility] in 4 days Diagnosis #1 weakness #2 UTI #3 hyperglycemia #4 nonspecific chest pain        Doug Sou, MD 06/12/12 1408

## 2012-06-12 NOTE — ED Notes (Signed)
MD at bedside. 

## 2012-06-12 NOTE — ED Notes (Signed)
Pt sts she does not want foley cath and is able to ambulate to bathroom.

## 2012-06-12 NOTE — ED Notes (Signed)
Pt c/o generalized weakness since October and has been evaluated by Cardiology for same. Pt ambulatory.

## 2012-06-14 DIAGNOSIS — E876 Hypokalemia: Secondary | ICD-10-CM | POA: Diagnosis not present

## 2012-06-14 DIAGNOSIS — R002 Palpitations: Secondary | ICD-10-CM | POA: Diagnosis not present

## 2012-06-15 LAB — URINE CULTURE

## 2012-06-16 ENCOUNTER — Ambulatory Visit: Payer: Medicare Other | Admitting: Nurse Practitioner

## 2012-08-10 ENCOUNTER — Ambulatory Visit (INDEPENDENT_AMBULATORY_CARE_PROVIDER_SITE_OTHER): Payer: Medicare Other | Admitting: Cardiology

## 2012-08-10 ENCOUNTER — Encounter: Payer: Self-pay | Admitting: Cardiology

## 2012-08-10 VITALS — BP 132/64 | HR 72 | Ht 63.0 in | Wt 141.0 lb

## 2012-08-10 DIAGNOSIS — E785 Hyperlipidemia, unspecified: Secondary | ICD-10-CM | POA: Diagnosis not present

## 2012-08-10 DIAGNOSIS — I10 Essential (primary) hypertension: Secondary | ICD-10-CM

## 2012-08-10 DIAGNOSIS — I2510 Atherosclerotic heart disease of native coronary artery without angina pectoris: Secondary | ICD-10-CM

## 2012-08-10 DIAGNOSIS — I4949 Other premature depolarization: Secondary | ICD-10-CM | POA: Diagnosis not present

## 2012-08-10 MED ORDER — NITROGLYCERIN 0.4 MG SL SUBL: 0.4000 mg | SUBLINGUAL_TABLET | SUBLINGUAL | Status: DC | PRN

## 2012-08-10 NOTE — Assessment & Plan Note (Signed)
Improved symptomatically. Continue high potassium diet. No change in other meds. Return the office in one year. All meds refilled.

## 2012-08-10 NOTE — Patient Instructions (Addendum)
Your physician recommends that you continue on your current medications as directed. Please refer to the Current Medication list given to you today.   Your physician wants you to follow-up in: 1 year with Dr. Wall. You will receive a reminder letter in the mail two months in advance. If you don't receive a letter, please call our office to schedule the follow-up appointment.  

## 2012-08-10 NOTE — Progress Notes (Signed)
HPI [name] returns today for tachycardia palpitations. Please see the note from [name]. She increased her Lopressor at that time which made her very tired. She has cut it back. She found out later that her potassium was low and she had a urinary tract infection. She is not on potassium supplementation but is making a conscious effort to eating bananas and other fruit. Her symptoms have resolved. She is having no angina or ischemic symptoms. She is 2 years out from a stent.  Past Medical History  Diagnosis Date  . Premature ventricular contractions   . Coronary atherosclerosis of native coronary artery     prior DES to the marginal of the LCX in January of 2012  . HLD (hyperlipidemia)   . HTN (hypertension)   . Hiatal hernia   . Hypothyroidism   . Osteoporosis   . Palpitations     Current Outpatient Prescriptions  Medication Sig Dispense Refill  . acetaminophen (TYLENOL) 500 MG tablet Take 1,000 mg by mouth every 6 (six) hours as needed.        Marland Kitchen aspirin (ASPIR-81) 81 MG EC tablet Take 81 mg by mouth daily.        . Calcium Carbonate (CALTRATE 600) 1500 MG TABS Take by mouth 2 (two) times daily.        . cephALEXin (KEFLEX) 250 MG capsule Take 250 mg by mouth 4 (four) times daily. For dental procedures      . Cholecalciferol (VITAMIN D3) 1000 UNITS CAPS Take by mouth daily.        Marland Kitchen esomeprazole (NEXIUM) 40 MG capsule Take 40 mg by mouth daily.        Marland Kitchen levothyroxine (UNITHROID) 50 MCG tablet Take 50 mcg by mouth daily.        . metoprolol tartrate (LOPRESSOR) 25 MG tablet Take 12.5 mg by mouth 2 (two) times daily.      . Multiple Vitamins-Minerals (MULTIVITAL) tablet Take 1 tablet by mouth daily.        . nitroGLYCERIN (NITROSTAT) 0.4 MG SL tablet Place 0.4 mg under the tongue every 5 (five) minutes as needed.      . prasugrel (EFFIENT) 10 MG TABS Take 1 tablet (10 mg total) by mouth daily.  30 tablet  11  . rosuvastatin (CRESTOR) 20 MG tablet Take 1 tablet (20 mg total) by  mouth daily.  30 tablet  11  . valsartan-hydrochlorothiazide (DIOVAN HCT) 160-12.5 MG per tablet Take 1 tablet by mouth daily.          Allergies  Allergen Reactions  . Atorvastatin     REACTION: myaligia  . Codeine   . Flagyl (Metronidazole Hcl)   . Metronidazole   . Penicillins   . Simvastatin     REACTION: myalgia    Family History  Problem Relation Age of Onset  . Heart attack Brother 40    History   Social History  . Marital Status: Married    Spouse Name: N/A    Number of Children: N/A  . Years of Education: N/A   Occupational History  . Not on file.   Social History Main Topics  . Smoking status: Never Smoker   . Smokeless tobacco: Not on file     Comment: tobacco use- no  . Alcohol Use: No  . Drug Use: No  . Sexually Active: Yes   Other Topics Concern  . Not on file   Social History Narrative  . No narrative on file  ROS ALL NEGATIVE EXCEPT THOSE NOTED IN HPI  PE  General Appearance: well developed, well nourished in no acute distress HEENT: symmetrical face, PERRLA, good dentition  Neck: no JVD, thyromegaly, or adenopathy, trachea midline Chest: symmetric without deformity Cardiac: PMI non-displaced, RRR, normal S1, S2, no gallop or murmur Lung: clear to ausculation and percussion Vascular: all pulses full without bruits  Abdominal: nondistended, nontender, good bowel sounds, no HSM, no bruits Extremities: no cyanosis, clubbing or edema, no sign of DVT, no varicosities  Skin: normal color, no rashes Neuro: alert and oriented x 3, non-focal Pysch: normal affect  EKG Not repeated  BMET    Component Value Date/Time   NA 140 06/12/2012 0950   K 3.4* 06/12/2012 0950   CL 101 06/12/2012 0950   CO2 27 06/12/2012 0950   GLUCOSE 150* 06/12/2012 0950   BUN 11 06/12/2012 0950   CREATININE 0.70 06/12/2012 0950   CALCIUM 9.5 06/12/2012 0950   GFRNONAA 88* 06/12/2012 0950   GFRAA >90 06/12/2012 0950    Lipid Panel     Component Value  Date/Time   CHOL 200 09/28/2010 0914   TRIG 240.0* 09/28/2010 0914   HDL 50.30 09/28/2010 0914   CHOLHDL 4 09/28/2010 0914   VLDL 48.0* 09/28/2010 0914   LDLCALC  Value: 161        Total Cholesterol/HDL:CHD Risk Coronary Heart Disease Risk Table                     Men   Women  1/2 Average Risk   3.4   3.3  Average Risk       5.0   4.4  2 X Average Risk   9.6   7.1  3 X Average Risk  23.4   11.0        Use the calculated Patient Ratio above and the CHD Risk Table to determine the patient's CHD Risk.        ATP III CLASSIFICATION (LDL):  <100     mg/dL   Optimal  161-096  mg/dL   Near or Above                    Optimal  130-159  mg/dL   Borderline  045-409  mg/dL   High  >811     mg/dL   Very High* 04/08/7828 0203    CBC    Component Value Date/Time   WBC 8.9 06/12/2012 0950   RBC 4.26 06/12/2012 0950   HGB 11.9* 06/12/2012 0950   HCT 36.4 06/12/2012 0950   PLT 251 06/12/2012 0950   MCV 85.4 06/12/2012 0950   MCH 27.9 06/12/2012 0950   MCHC 32.7 06/12/2012 0950   RDW 13.3 06/12/2012 0950   LYMPHSABS 1.7 04/10/2012 1155   MONOABS 0.6 04/10/2012 1155   EOSABS 0.1 04/10/2012 1155   BASOSABS 0.0 04/10/2012 1155

## 2012-08-24 ENCOUNTER — Telehealth: Payer: Self-pay | Admitting: Cardiology

## 2012-08-24 ENCOUNTER — Other Ambulatory Visit: Payer: Self-pay | Admitting: *Deleted

## 2012-08-24 MED ORDER — ROSUVASTATIN CALCIUM 20 MG PO TABS: 20.0000 mg | ORAL_TABLET | Freq: Every day | ORAL | Status: DC

## 2012-08-24 MED ORDER — PRASUGREL HCL 10 MG PO TABS: 10.0000 mg | ORAL_TABLET | Freq: Every day | ORAL | Status: DC

## 2012-08-24 NOTE — Telephone Encounter (Signed)
Called in rx for crestor 20mg  #30, 11 refills  Effient 10mg  #30, 11 refills  To [facility] in [place]

## 2012-08-24 NOTE — Telephone Encounter (Signed)
effient 10 mg   crestor 20 mg    [facility] [place]

## 2012-09-09 ENCOUNTER — Other Ambulatory Visit: Payer: Self-pay

## 2012-09-21 DIAGNOSIS — H2510 Age-related nuclear cataract, unspecified eye: Secondary | ICD-10-CM | POA: Diagnosis not present

## 2012-11-03 DIAGNOSIS — R300 Dysuria: Secondary | ICD-10-CM | POA: Diagnosis not present

## 2012-11-03 DIAGNOSIS — N390 Urinary tract infection, site not specified: Secondary | ICD-10-CM | POA: Diagnosis not present

## 2012-12-04 ENCOUNTER — Telehealth: Payer: Self-pay | Admitting: Cardiology

## 2012-12-04 NOTE — Telephone Encounter (Signed)
Pt calling re samples of effient and or crestor , pls call can pick up tomorrow

## 2012-12-05 DIAGNOSIS — Z09 Encounter for follow-up examination after completed treatment for conditions other than malignant neoplasm: Secondary | ICD-10-CM | POA: Diagnosis not present

## 2012-12-05 NOTE — Telephone Encounter (Signed)
Samples of Crestor 20mg  given to patient. No Effient available currently.

## 2013-01-20 DIAGNOSIS — R5381 Other malaise: Secondary | ICD-10-CM | POA: Diagnosis not present

## 2013-01-20 DIAGNOSIS — N390 Urinary tract infection, site not specified: Secondary | ICD-10-CM | POA: Diagnosis not present

## 2013-01-20 DIAGNOSIS — R5383 Other fatigue: Secondary | ICD-10-CM | POA: Diagnosis not present

## 2013-02-28 ENCOUNTER — Other Ambulatory Visit: Payer: Self-pay

## 2013-03-15 ENCOUNTER — Telehealth: Payer: Self-pay | Admitting: Cardiology

## 2013-03-15 NOTE — Telephone Encounter (Signed)
New Prob  Pt is wanting samples of Crestor 20 MG and Effient 10 MG. Pt states she will be in [place] tomorrow and wants to come by then and get them.

## 2013-03-15 NOTE — Telephone Encounter (Signed)
LMTCB Debbie Theodoro Koval RN  

## 2013-03-15 NOTE — Telephone Encounter (Signed)
Pt is aware samples for Crestor & Effient are at the front desk. Pt would like to follow-up with Dr. Tenny Craw in January. Former pt of Dr. Daleen Squibb Recall placed in epic Mylo Red RN

## 2013-03-15 NOTE — Telephone Encounter (Signed)
F/up  ° °Pt returned call  °

## 2013-04-20 DIAGNOSIS — Z23 Encounter for immunization: Secondary | ICD-10-CM | POA: Diagnosis not present

## 2013-05-09 ENCOUNTER — Other Ambulatory Visit: Payer: Self-pay

## 2013-05-09 ENCOUNTER — Emergency Department (HOSPITAL_BASED_OUTPATIENT_CLINIC_OR_DEPARTMENT_OTHER)
Admission: EM | Admit: 2013-05-09 | Discharge: 2013-05-09 | Disposition: A | Discharge status: Home / Self Care | Payer: Medicare Other | Attending: Emergency Medicine | Admitting: Emergency Medicine

## 2013-05-09 ENCOUNTER — Encounter (HOSPITAL_BASED_OUTPATIENT_CLINIC_OR_DEPARTMENT_OTHER): Payer: Self-pay | Admitting: Emergency Medicine

## 2013-05-09 DIAGNOSIS — Z880 Allergy status to penicillin: Secondary | ICD-10-CM | POA: Diagnosis not present

## 2013-05-09 DIAGNOSIS — Z8719 Personal history of other diseases of the digestive system: Secondary | ICD-10-CM | POA: Diagnosis not present

## 2013-05-09 DIAGNOSIS — I2510 Atherosclerotic heart disease of native coronary artery without angina pectoris: Secondary | ICD-10-CM | POA: Diagnosis not present

## 2013-05-09 DIAGNOSIS — E039 Hypothyroidism, unspecified: Secondary | ICD-10-CM | POA: Diagnosis not present

## 2013-05-09 DIAGNOSIS — R5383 Other fatigue: Secondary | ICD-10-CM | POA: Diagnosis not present

## 2013-05-09 DIAGNOSIS — Z792 Long term (current) use of antibiotics: Secondary | ICD-10-CM | POA: Insufficient documentation

## 2013-05-09 DIAGNOSIS — R531 Weakness: Secondary | ICD-10-CM

## 2013-05-09 DIAGNOSIS — I10 Essential (primary) hypertension: Secondary | ICD-10-CM | POA: Insufficient documentation

## 2013-05-09 DIAGNOSIS — E785 Hyperlipidemia, unspecified: Secondary | ICD-10-CM | POA: Diagnosis not present

## 2013-05-09 DIAGNOSIS — Z7902 Long term (current) use of antithrombotics/antiplatelets: Secondary | ICD-10-CM | POA: Insufficient documentation

## 2013-05-09 DIAGNOSIS — Z8739 Personal history of other diseases of the musculoskeletal system and connective tissue: Secondary | ICD-10-CM | POA: Insufficient documentation

## 2013-05-09 DIAGNOSIS — Z9861 Coronary angioplasty status: Secondary | ICD-10-CM | POA: Diagnosis not present

## 2013-05-09 DIAGNOSIS — Z7982 Long term (current) use of aspirin: Secondary | ICD-10-CM | POA: Insufficient documentation

## 2013-05-09 DIAGNOSIS — R5381 Other malaise: Secondary | ICD-10-CM | POA: Insufficient documentation

## 2013-05-09 DIAGNOSIS — Z79899 Other long term (current) drug therapy: Secondary | ICD-10-CM | POA: Insufficient documentation

## 2013-05-09 LAB — COMPREHENSIVE METABOLIC PANEL
ALT: 27 U/L (ref 0–35)
Alkaline Phosphatase: 91 U/L (ref 39–117)
BUN: 12 mg/dL (ref 6–23)
CO2: 31 mEq/L (ref 19–32)
Chloride: 101 mEq/L (ref 96–112)
GFR calc Af Amer: 86 mL/min — ABNORMAL LOW (ref >90)
GFR calc non Af Amer: 74 mL/min — ABNORMAL LOW (ref >90)
Glucose, Bld: 112 mg/dL — ABNORMAL HIGH (ref 70–99)
Potassium: 3.7 mEq/L (ref 3.5–5.1)
Sodium: 139 mEq/L (ref 135–145)
Total Bilirubin: 0.8 mg/dL (ref 0.3–1.2)
Total Protein: 7.2 g/dL (ref 6.0–8.3)

## 2013-05-09 LAB — URINALYSIS, ROUTINE W REFLEX MICROSCOPIC
Bilirubin Urine: NEGATIVE
Glucose, UA: NEGATIVE mg/dL
Ketones, ur: NEGATIVE mg/dL
Nitrite: NEGATIVE
Protein, ur: NEGATIVE mg/dL
Specific Gravity, Urine: 1.005 (ref 1.005–1.030)
Urobilinogen, UA: 0.2 mg/dL (ref 0.0–1.0)
pH: 6.5 (ref 5.0–8.0)

## 2013-05-09 LAB — CBC WITH DIFFERENTIAL/PLATELET
Basophils Relative: 0 % (ref 0–1)
Eosinophils Absolute: 0.2 10*3/uL (ref 0.0–0.7)
Eosinophils Relative: 2 % (ref 0–5)
Hemoglobin: 11.9 g/dL — ABNORMAL LOW (ref 12.0–15.0)
Lymphs Abs: 2.6 10*3/uL (ref 0.7–4.0)
MCH: 27.8 pg (ref 26.0–34.0)
MCHC: 32 g/dL (ref 30.0–36.0)
MCV: 86.9 fL (ref 78.0–100.0)
Monocytes Absolute: 0.7 10*3/uL (ref 0.1–1.0)
Monocytes Relative: 8 % (ref 3–12)
Neutro Abs: 5.2 10*3/uL (ref 1.7–7.7)
Neutrophils Relative %: 60 % (ref 43–77)
RBC: 4.28 MIL/uL (ref 3.87–5.11)

## 2013-05-09 LAB — URINE MICROSCOPIC-ADD ON

## 2013-05-09 LAB — PROTIME-INR: Prothrombin Time: 12.6 seconds (ref 11.6–15.2)

## 2013-05-09 MED ORDER — SODIUM CHLORIDE 0.9 % IV BOLUS (SEPSIS)
500.0000 mL | Freq: Once | INTRAVENOUS | Status: AC
  Administered 2013-05-09: 500 mL via INTRAVENOUS

## 2013-05-09 NOTE — ED Notes (Signed)
Pt reports fatigue that started this morning.  Pt has hx of same and usually has a UTI.  Denies urinary symptoms.  CVA screen negative-no droop, drift or confusion present.  A/O x 4.

## 2013-05-09 NOTE — ED Provider Notes (Signed)
CSN: [id number]     Arrival date & time 05/09/13  1642 History   First MD Initiated Contact with Patient 05/09/13 1715     Chief Complaint  Patient presents with  . Fatigue   (Consider location/radiation/quality/duration/timing/severity/associated sxs/prior Treatment) HPI Pt reports she was in her normal state of health yesterday, woke up today feeling generally weak and worsened throughout the day. She took a nap around lunch time with no improvement. Was weak to the point of feeling like she needed to go back to bed around 4pm. Denies any specific symptoms otherwise, no CP, SOB, fever, N/V/D, dysuria, GI bleeding or melena. States she has had similar symptoms in the past with UTI and low K. Denies rash, tick bites or mosquito bites  Past Medical History  Diagnosis Date  . Premature ventricular contractions   . Coronary atherosclerosis of native coronary artery     prior DES to the marginal of the LCX in January of 2012  . HLD (hyperlipidemia)   . HTN (hypertension)   . Hiatal hernia   . Hypothyroidism   . Osteoporosis   . Palpitations    Past Surgical History  Procedure Laterality Date  . Hysterectomy (other)    . Tonsillectomy    . Appendectomy    . Cardiac catheterization    . Coronary angioplasty with stent placement    . Breast surgery     Family History  Problem Relation Age of Onset  . Heart attack Brother 40   History  Substance Use Topics  . Smoking status: Never Smoker   . Smokeless tobacco: Not on file     Comment: tobacco use- no  . Alcohol Use: No   OB History   Grav Para Term Preterm Abortions TAB SAB Ect Mult Living                 Review of Systems All other systems reviewed and are negative except as noted in HPI.   Allergies  Atorvastatin; Codeine; Flagyl; Metronidazole; Penicillins; and Simvastatin  Home Medications   Current Outpatient Rx  Name  Route  Sig  Dispense  Refill  . acetaminophen (TYLENOL) 500 MG tablet   Oral   Take 1,000  mg by mouth every 6 (six) hours as needed.           Marland Kitchen aspirin (ASPIR-81) 81 MG EC tablet   Oral   Take 81 mg by mouth daily.           . Calcium Carbonate (CALTRATE 600) 1500 MG TABS   Oral   Take by mouth 2 (two) times daily.           . cephALEXin (KEFLEX) 250 MG capsule   Oral   Take 250 mg by mouth 4 (four) times daily. For dental procedures         . Cholecalciferol (VITAMIN D3) 1000 UNITS CAPS   Oral   Take by mouth daily.           Marland Kitchen esomeprazole (NEXIUM) 40 MG capsule   Oral   Take 40 mg by mouth daily.           Marland Kitchen levothyroxine (UNITHROID) 50 MCG tablet   Oral   Take 50 mcg by mouth daily.           . metoprolol tartrate (LOPRESSOR) 25 MG tablet   Oral   Take 12.5 mg by mouth 2 (two) times daily.         . Multiple Vitamins-Minerals (  MULTIVITAL) tablet   Oral   Take 1 tablet by mouth daily.           . nitroGLYCERIN (NITROSTAT) 0.4 MG SL tablet   Sublingual   Place 1 tablet (0.4 mg total) under the tongue every 5 (five) minutes as needed.   25 tablet   6   . prasugrel (EFFIENT) 10 MG TABS   Oral   Take 1 tablet (10 mg total) by mouth daily.   30 tablet   11   . rosuvastatin (CRESTOR) 20 MG tablet   Oral   Take 1 tablet (20 mg total) by mouth daily.   30 tablet   11   . valsartan-hydrochlorothiazide (DIOVAN HCT) 160-12.5 MG per tablet   Oral   Take 1 tablet by mouth daily.            BP 179/81  Pulse 80  Temp(Src) 98.9 F (37.2 C) (Oral)  Resp 18  Ht 5\' 3"  (1.6 m)  Wt 140 lb (63.504 kg)  BMI 24.81 kg/m2  SpO2 98% Physical Exam  Nursing note and vitals reviewed. Constitutional: She is oriented to person, place, and time. She appears well-developed and well-nourished.  HENT:  Head: Normocephalic and atraumatic.  Eyes: EOM are normal. Pupils are equal, round, and reactive to light.  Neck: Normal range of motion. Neck supple.  Cardiovascular: Normal rate, normal heart sounds and intact distal pulses.   Pulmonary/Chest:  Effort normal and breath sounds normal.  Abdominal: Bowel sounds are normal. She exhibits no distension. There is no tenderness.  Musculoskeletal: Normal range of motion. She exhibits no edema and no tenderness.  Neurological: She is alert and oriented to person, place, and time. No cranial nerve deficit or sensory deficit. She exhibits normal muscle tone. Coordination normal.  Globally decreased strength proximally and distally, no focal deficits  Skin: Skin is warm and dry. No rash noted.  Psychiatric: She has a normal mood and affect.    ED Course  Procedures (including critical care time) Labs Review Labs Reviewed  URINALYSIS, ROUTINE W REFLEX MICROSCOPIC - Abnormal; Notable for the following:    Hgb urine dipstick TRACE (*)    Leukocytes, UA SMALL (*)    All other components within normal limits  CBC WITH DIFFERENTIAL - Abnormal; Notable for the following:    Hemoglobin 11.9 (*)    All other components within normal limits  COMPREHENSIVE METABOLIC PANEL - Abnormal; Notable for the following:    Glucose, Bld 112 (*)    GFR calc non Af Amer 74 (*)    GFR calc Af Amer 86 (*)    All other components within normal limits  URINE MICROSCOPIC-ADD ON  TROPONIN I  PROTIME-INR  APTT   Imaging Review No results found.   Date: 05/09/2013  Rate: 71  Rhythm: normal sinus rhythm  QRS Axis: normal  Intervals: normal  ST/T Wave abnormalities: nonspecific T wave changes  Conduction Disutrbances:none  Narrative Interpretation:   Old EKG Reviewed: unchanged    MDM   1. General weakness     Labs reviewed and unremarkable. Pt feeling better with rest and IVF. She thinks maybe she 'overdid it' shopping yesterday. Able to walk to bathroom without difficulty and wants to go home. Advised to rest, drink adequate fluid and followup with PCP for recheck, including thyroid function,  if not improving    Devorah Givhan B. Bernette Mayers, MD 05/09/13 [phone]

## 2013-05-16 DIAGNOSIS — I10 Essential (primary) hypertension: Secondary | ICD-10-CM | POA: Diagnosis not present

## 2013-05-16 DIAGNOSIS — E7800 Pure hypercholesterolemia, unspecified: Secondary | ICD-10-CM | POA: Diagnosis not present

## 2013-05-16 DIAGNOSIS — E559 Vitamin D deficiency, unspecified: Secondary | ICD-10-CM | POA: Diagnosis not present

## 2013-05-16 DIAGNOSIS — E039 Hypothyroidism, unspecified: Secondary | ICD-10-CM | POA: Diagnosis not present

## 2013-05-21 DIAGNOSIS — K219 Gastro-esophageal reflux disease without esophagitis: Secondary | ICD-10-CM | POA: Diagnosis not present

## 2013-05-21 DIAGNOSIS — E039 Hypothyroidism, unspecified: Secondary | ICD-10-CM | POA: Diagnosis not present

## 2013-05-21 DIAGNOSIS — R350 Frequency of micturition: Secondary | ICD-10-CM | POA: Diagnosis not present

## 2013-05-21 DIAGNOSIS — E7800 Pure hypercholesterolemia, unspecified: Secondary | ICD-10-CM | POA: Diagnosis not present

## 2013-05-21 DIAGNOSIS — Z23 Encounter for immunization: Secondary | ICD-10-CM | POA: Diagnosis not present

## 2013-05-21 DIAGNOSIS — I10 Essential (primary) hypertension: Secondary | ICD-10-CM | POA: Diagnosis not present

## 2013-05-22 ENCOUNTER — Emergency Department (HOSPITAL_BASED_OUTPATIENT_CLINIC_OR_DEPARTMENT_OTHER): Payer: Medicare Other

## 2013-05-22 ENCOUNTER — Inpatient Hospital Stay (HOSPITAL_BASED_OUTPATIENT_CLINIC_OR_DEPARTMENT_OTHER)
Admission: EM | Admit: 2013-05-22 | Discharge: 2013-05-24 | DRG: 247 | Disposition: A | Discharge status: Home / Self Care | Payer: Medicare Other | Attending: Cardiovascular Disease | Admitting: Cardiovascular Disease

## 2013-05-22 ENCOUNTER — Encounter (HOSPITAL_BASED_OUTPATIENT_CLINIC_OR_DEPARTMENT_OTHER): Payer: Self-pay | Admitting: Emergency Medicine

## 2013-05-22 DIAGNOSIS — I10 Essential (primary) hypertension: Secondary | ICD-10-CM | POA: Diagnosis present

## 2013-05-22 DIAGNOSIS — Z9861 Coronary angioplasty status: Secondary | ICD-10-CM | POA: Diagnosis not present

## 2013-05-22 DIAGNOSIS — D649 Anemia, unspecified: Secondary | ICD-10-CM | POA: Diagnosis present

## 2013-05-22 DIAGNOSIS — E876 Hypokalemia: Secondary | ICD-10-CM | POA: Diagnosis not present

## 2013-05-22 DIAGNOSIS — Z7982 Long term (current) use of aspirin: Secondary | ICD-10-CM | POA: Diagnosis not present

## 2013-05-22 DIAGNOSIS — E039 Hypothyroidism, unspecified: Secondary | ICD-10-CM | POA: Diagnosis present

## 2013-05-22 DIAGNOSIS — Z955 Presence of coronary angioplasty implant and graft: Secondary | ICD-10-CM

## 2013-05-22 DIAGNOSIS — Z8249 Family history of ischemic heart disease and other diseases of the circulatory system: Secondary | ICD-10-CM

## 2013-05-22 DIAGNOSIS — E785 Hyperlipidemia, unspecified: Secondary | ICD-10-CM | POA: Diagnosis not present

## 2013-05-22 DIAGNOSIS — I4949 Other premature depolarization: Secondary | ICD-10-CM | POA: Diagnosis present

## 2013-05-22 DIAGNOSIS — Z9089 Acquired absence of other organs: Secondary | ICD-10-CM | POA: Diagnosis not present

## 2013-05-22 DIAGNOSIS — Z7902 Long term (current) use of antithrombotics/antiplatelets: Secondary | ICD-10-CM

## 2013-05-22 DIAGNOSIS — Z888 Allergy status to other drugs, medicaments and biological substances status: Secondary | ICD-10-CM | POA: Diagnosis not present

## 2013-05-22 DIAGNOSIS — Z79899 Other long term (current) drug therapy: Secondary | ICD-10-CM

## 2013-05-22 DIAGNOSIS — I2510 Atherosclerotic heart disease of native coronary artery without angina pectoris: Principal | ICD-10-CM | POA: Diagnosis present

## 2013-05-22 DIAGNOSIS — M810 Age-related osteoporosis without current pathological fracture: Secondary | ICD-10-CM | POA: Diagnosis present

## 2013-05-22 DIAGNOSIS — Z880 Allergy status to penicillin: Secondary | ICD-10-CM

## 2013-05-22 DIAGNOSIS — R079 Chest pain, unspecified: Secondary | ICD-10-CM

## 2013-05-22 DIAGNOSIS — R072 Precordial pain: Secondary | ICD-10-CM | POA: Diagnosis not present

## 2013-05-22 DIAGNOSIS — J984 Other disorders of lung: Secondary | ICD-10-CM | POA: Diagnosis not present

## 2013-05-22 DIAGNOSIS — I200 Unstable angina: Secondary | ICD-10-CM | POA: Diagnosis not present

## 2013-05-22 HISTORY — DX: Urinary tract infection, site not specified: N39.0

## 2013-05-22 HISTORY — DX: Cardiac murmur, unspecified: R01.1

## 2013-05-22 HISTORY — DX: Shortness of breath: R06.02

## 2013-05-22 HISTORY — DX: Headache, unspecified: R51.9

## 2013-05-22 HISTORY — DX: Headache: R51

## 2013-05-22 HISTORY — DX: Personal history of other medical treatment: Z92.89

## 2013-05-22 HISTORY — DX: Angina pectoris, unspecified: I20.9

## 2013-05-22 LAB — CBC WITH DIFFERENTIAL/PLATELET
Basophils Absolute: 0 10*3/uL (ref 0.0–0.1)
Basophils Relative: 0 % (ref 0–1)
Eosinophils Relative: 2 % (ref 0–5)
HCT: 39 % (ref 36.0–46.0)
MCHC: 32.6 g/dL (ref 30.0–36.0)
MCV: 86.1 fL (ref 78.0–100.0)
Monocytes Absolute: 0.7 10*3/uL (ref 0.1–1.0)
Monocytes Relative: 6 % (ref 3–12)
Neutro Abs: 7.6 10*3/uL (ref 1.7–7.7)
Platelets: 285 10*3/uL (ref 150–400)
RBC: 4.53 MIL/uL (ref 3.87–5.11)
RDW: 13.5 % (ref 11.5–15.5)
WBC: 12 10*3/uL — ABNORMAL HIGH (ref 4.0–10.5)

## 2013-05-22 LAB — TROPONIN I
Troponin I: <0.3 ng/mL (ref <0.30)
Troponin I: <0.3 ng/mL (ref <0.30)

## 2013-05-22 LAB — PROTIME-INR: INR: 0.9 (ref 0.00–1.49)

## 2013-05-22 LAB — BASIC METABOLIC PANEL
Calcium: 9.6 mg/dL (ref 8.4–10.5)
Chloride: 100 mEq/L (ref 96–112)
Creatinine, Ser: 0.7 mg/dL (ref 0.50–1.10)
GFR calc Af Amer: >90 mL/min (ref >90)
Sodium: 140 mEq/L (ref 135–145)

## 2013-05-22 LAB — PRO B NATRIURETIC PEPTIDE: Pro B Natriuretic peptide (BNP): 202.9 pg/mL — ABNORMAL HIGH (ref 0–125)

## 2013-05-22 MED ORDER — CALCIUM CARBONATE 1250 (500 CA) MG PO TABS
600.0000 mg | ORAL_TABLET | Freq: Two times a day (BID) | ORAL | Status: DC
  Filled 2013-05-22 (×2): qty 0.5

## 2013-05-22 MED ORDER — ONDANSETRON HCL 4 MG/2ML IJ SOLN: 4.0000 mg | Freq: Three times a day (TID) | INTRAMUSCULAR | Status: DC | PRN

## 2013-05-22 MED ORDER — NITROGLYCERIN 0.4 MG SL SUBL: 0.4000 mg | SUBLINGUAL_TABLET | SUBLINGUAL | Status: DC | PRN

## 2013-05-22 MED ORDER — SODIUM CHLORIDE 0.9 % IJ SOLN: 3.0000 mL | INTRAMUSCULAR | Status: DC | PRN

## 2013-05-22 MED ORDER — PANTOPRAZOLE SODIUM 40 MG PO TBEC
80.0000 mg | DELAYED_RELEASE_TABLET | Freq: Every day | ORAL | Status: DC
  Administered 2013-05-23: 80 mg via ORAL
  Filled 2013-05-22: qty 2

## 2013-05-22 MED ORDER — NITROGLYCERIN 2 % TD OINT
0.5000 [in_us] | TOPICAL_OINTMENT | Freq: Once | TRANSDERMAL | Status: AC
  Administered 2013-05-22: 0.5 [in_us] via TOPICAL
  Filled 2013-05-22: qty 1

## 2013-05-22 MED ORDER — ASPIRIN 325 MG PO TABS
325.0000 mg | ORAL_TABLET | Freq: Once | ORAL | Status: AC
  Administered 2013-05-22: 325 mg via ORAL
  Filled 2013-05-22: qty 1

## 2013-05-22 MED ORDER — SODIUM CHLORIDE 0.9 % IJ SOLN: 3.0000 mL | Freq: Two times a day (BID) | INTRAMUSCULAR | Status: DC

## 2013-05-22 MED ORDER — SODIUM CHLORIDE 0.9 % IV SOLN
1.0000 mL/kg/h | INTRAVENOUS | Status: DC
Start: 2013-05-23 — End: 2013-05-23
  Administered 2013-05-23: 1 mL/kg/h via INTRAVENOUS

## 2013-05-22 MED ORDER — ALPRAZOLAM 0.25 MG PO TABS: 0.2500 mg | ORAL_TABLET | Freq: Two times a day (BID) | ORAL | Status: DC | PRN

## 2013-05-22 MED ORDER — HYDROXYZINE HCL 25 MG PO TABS
25.0000 mg | ORAL_TABLET | Freq: Three times a day (TID) | ORAL | Status: DC | PRN
  Filled 2013-05-22: qty 2

## 2013-05-22 MED ORDER — SODIUM CHLORIDE 0.9 % IV SOLN: 250.0000 mL | INTRAVENOUS | Status: DC | PRN

## 2013-05-22 MED ORDER — ASPIRIN 81 MG PO CHEW
81.0000 mg | CHEWABLE_TABLET | Freq: Every day | ORAL | Status: DC
  Filled 2013-05-22: qty 1

## 2013-05-22 MED ORDER — PRASUGREL HCL 10 MG PO TABS
10.0000 mg | ORAL_TABLET | Freq: Every day | ORAL | Status: DC
  Administered 2013-05-22 – 2013-05-23 (×2): 10 mg via ORAL
  Filled 2013-05-22 (×3): qty 1

## 2013-05-22 MED ORDER — ACETAMINOPHEN 325 MG PO TABS: 650.0000 mg | ORAL_TABLET | ORAL | Status: DC | PRN

## 2013-05-22 MED ORDER — ACETAMINOPHEN 500 MG PO TABS: 1000.0000 mg | ORAL_TABLET | Freq: Four times a day (QID) | ORAL | Status: DC | PRN

## 2013-05-22 MED ORDER — DIAZEPAM 5 MG PO TABS
5.0000 mg | ORAL_TABLET | ORAL | Status: AC
  Administered 2013-05-23: 5 mg via ORAL
  Filled 2013-05-22: qty 1

## 2013-05-22 MED ORDER — ASPIRIN 81 MG PO CHEW
324.0000 mg | CHEWABLE_TABLET | ORAL | Status: AC
  Administered 2013-05-23: 324 mg via ORAL
  Filled 2013-05-22: qty 4

## 2013-05-22 MED ORDER — LEVOTHYROXINE SODIUM 50 MCG PO TABS
50.0000 ug | ORAL_TABLET | Freq: Every day | ORAL | Status: DC
  Administered 2013-05-23 – 2013-05-24 (×2): 50 ug via ORAL
  Filled 2013-05-22 (×3): qty 1

## 2013-05-22 MED ORDER — ENOXAPARIN SODIUM 40 MG/0.4ML ~~LOC~~ SOLN
40.0000 mg | SUBCUTANEOUS | Status: DC
  Administered 2013-05-22 – 2013-05-23 (×2): 40 mg via SUBCUTANEOUS
  Filled 2013-05-22 (×4): qty 0.4

## 2013-05-22 MED ORDER — NITROGLYCERIN 0.4 MG SL SUBL
0.4000 mg | SUBLINGUAL_TABLET | SUBLINGUAL | Status: AC
  Administered 2013-05-22 (×2): 0.4 mg via SUBLINGUAL
  Filled 2013-05-22 (×2): qty 25

## 2013-05-22 MED ORDER — ZOLPIDEM TARTRATE 5 MG PO TABS: 5.0000 mg | ORAL_TABLET | Freq: Every evening | ORAL | Status: DC | PRN

## 2013-05-22 MED ORDER — ONDANSETRON HCL 4 MG/2ML IJ SOLN: 4.0000 mg | Freq: Four times a day (QID) | INTRAMUSCULAR | Status: DC | PRN

## 2013-05-22 MED ORDER — VITAMIN D 1000 UNITS PO TABS
1000.0000 [IU] | ORAL_TABLET | Freq: Every day | ORAL | Status: DC
  Administered 2013-05-23 – 2013-05-24 (×2): 1000 [IU] via ORAL
  Filled 2013-05-22 (×3): qty 1

## 2013-05-22 MED ORDER — ADULT MULTIVITAMIN W/MINERALS CH
1.0000 | ORAL_TABLET | Freq: Every day | ORAL | Status: DC
  Administered 2013-05-23 – 2013-05-24 (×2): 1 via ORAL
  Filled 2013-05-22 (×2): qty 1

## 2013-05-22 MED ORDER — METOPROLOL TARTRATE 12.5 MG HALF TABLET
12.5000 mg | ORAL_TABLET | Freq: Two times a day (BID) | ORAL | Status: DC
  Administered 2013-05-22 – 2013-05-24 (×4): 12.5 mg via ORAL
  Filled 2013-05-22 (×5): qty 1

## 2013-05-22 MED ORDER — CALCIUM CARBONATE 1250 (500 CA) MG PO TABS
1.0000 | ORAL_TABLET | Freq: Two times a day (BID) | ORAL | Status: DC
  Administered 2013-05-23 – 2013-05-24 (×3): 500 mg via ORAL
  Filled 2013-05-22 (×5): qty 1

## 2013-05-22 MED ORDER — SODIUM CHLORIDE 0.9 % IJ SOLN
3.0000 mL | Freq: Two times a day (BID) | INTRAMUSCULAR | Status: DC
  Administered 2013-05-22: 3 mL via INTRAVENOUS

## 2013-05-22 MED ORDER — ROSUVASTATIN CALCIUM 5 MG PO TABS
20.0000 mg | ORAL_TABLET | Freq: Every evening | ORAL | Status: DC
  Administered 2013-05-22: 20 mg via ORAL
  Administered 2013-05-23: 18:00:00 15 mg via ORAL
  Filled 2013-05-22 (×3): qty 4

## 2013-05-22 NOTE — ED Notes (Signed)
Patient states she was riding in the car with her husband and had a sudden onset of chest pain and pressure with radiation through out her chest and down both arms.  States she has nausea.  States she also had an increase in her thyroid medication today from 50 mcg to 75 mcg.

## 2013-05-22 NOTE — ED Notes (Signed)
Pt taking her own Nexium 40mg  po

## 2013-05-22 NOTE — ED Provider Notes (Signed)
CSN: [id number]     Arrival date & time 05/22/13  1308 History   First MD Initiated Contact with Patient 05/22/13 1311     Chief Complaint  Patient presents with  . Chest Pain   (Consider location/radiation/quality/duration/timing/severity/associated sxs/prior Treatment) Patient is a 67 y.o. female presenting with chest pain. The history is provided by the patient. No language interpreter was used.  Chest Pain Pain location:  Substernal area Pain quality: tightness   Pain radiates to:  R arm and L arm Pain radiates to the back: no   Pain severity:  Moderate Onset quality:  Sudden Duration:  45 minutes Timing:  Constant Progression:  Improving Chronicity:  New Context: at rest   Relieved by:  Nothing Worsened by:  Nothing tried Ineffective treatments:  None tried Associated symptoms: diaphoresis, dizziness, nausea, near-syncope, shortness of breath and vomiting   Associated symptoms: no abdominal pain, no anxiety, no back pain, no cough, no fatigue, no fever, no headache, no numbness, no palpitations and no weakness   Risk factors: coronary artery disease, high cholesterol and hypertension     Past Medical History  Diagnosis Date  . Premature ventricular contractions   . Coronary atherosclerosis of native coronary artery     prior DES to the marginal of the LCX in January of 2012  . HLD (hyperlipidemia)   . HTN (hypertension)   . Hiatal hernia   . Hypothyroidism   . Osteoporosis   . Palpitations    Past Surgical History  Procedure Laterality Date  . Hysterectomy (other)    . Tonsillectomy    . Appendectomy    . Cardiac catheterization    . Coronary angioplasty with stent placement    . Breast surgery    . Abdominal hysterectomy     Family History  Problem Relation Age of Onset  . Heart attack Brother 40   History  Substance Use Topics  . Smoking status: Never Smoker   . Smokeless tobacco: Not on file     Comment: tobacco use- no  . Alcohol Use: No   OB  History   Grav Para Term Preterm Abortions TAB SAB Ect Mult Living                 Review of Systems  Constitutional: Positive for diaphoresis. Negative for fever, chills, activity change, appetite change and fatigue.  HENT: Negative for congestion, facial swelling, rhinorrhea and sore throat.   Eyes: Negative for photophobia and discharge.  Respiratory: Positive for shortness of breath. Negative for cough and chest tightness.   Cardiovascular: Positive for chest pain and near-syncope. Negative for palpitations and leg swelling.  Gastrointestinal: Positive for nausea and vomiting. Negative for abdominal pain and diarrhea.  Endocrine: Negative for polydipsia and polyuria.  Genitourinary: Negative for dysuria, frequency, difficulty urinating and pelvic pain.  Musculoskeletal: Negative for arthralgias, back pain, neck pain and neck stiffness.  Skin: Negative for color change and wound.  Allergic/Immunologic: Negative for immunocompromised state.  Neurological: Positive for dizziness. Negative for facial asymmetry, weakness, numbness and headaches.  Hematological: Does not bruise/bleed easily.  Psychiatric/Behavioral: Negative for confusion and agitation.    Allergies  Atorvastatin; Codeine; Flagyl; Metronidazole; Penicillins; and Simvastatin  Home Medications   Current Outpatient Rx  Name  Route  Sig  Dispense  Refill  . acetaminophen (TYLENOL) 500 MG tablet   Oral   Take 1,000 mg by mouth every 6 (six) hours as needed.           Marland Kitchen aspirin (  ASPIR-81) 81 MG EC tablet   Oral   Take 81 mg by mouth daily.           . Calcium Carbonate (CALTRATE 600) 1500 MG TABS   Oral   Take by mouth 2 (two) times daily.           . cephALEXin (KEFLEX) 250 MG capsule   Oral   Take 250 mg by mouth 4 (four) times daily. For dental procedures         . Cholecalciferol (VITAMIN D3) 1000 UNITS CAPS   Oral   Take by mouth daily.           Marland Kitchen esomeprazole (NEXIUM) 40 MG capsule   Oral    Take 40 mg by mouth daily.           Marland Kitchen levothyroxine (UNITHROID) 50 MCG tablet   Oral   Take 50 mcg by mouth daily.           . metoprolol tartrate (LOPRESSOR) 25 MG tablet   Oral   Take 12.5 mg by mouth 2 (two) times daily.         . Multiple Vitamins-Minerals (MULTIVITAL) tablet   Oral   Take 1 tablet by mouth daily.           . nitroGLYCERIN (NITROSTAT) 0.4 MG SL tablet   Sublingual   Place 1 tablet (0.4 mg total) under the tongue every 5 (five) minutes as needed.   25 tablet   6   . prasugrel (EFFIENT) 10 MG TABS   Oral   Take 1 tablet (10 mg total) by mouth daily.   30 tablet   11   . rosuvastatin (CRESTOR) 20 MG tablet   Oral   Take 1 tablet (20 mg total) by mouth daily.   30 tablet   11   . valsartan-hydrochlorothiazide (DIOVAN HCT) 160-12.5 MG per tablet   Oral   Take 1 tablet by mouth daily.            BP 114/54  Pulse 78  Temp(Src) 98.9 F (37.2 C) (Oral)  Resp 12  Ht 5\' 3"  (1.6 m)  Wt 140 lb (63.504 kg)  BMI 24.81 kg/m2  SpO2 94% Physical Exam  Constitutional: She is oriented to person, place, and time. She appears well-developed and well-nourished. No distress.  HENT:  Head: Normocephalic and atraumatic.  Mouth/Throat: No oropharyngeal exudate.  Eyes: Pupils are equal, round, and reactive to light.  Neck: Normal range of motion. Neck supple.  Cardiovascular: Normal rate, regular rhythm and normal heart sounds.  Exam reveals no gallop and no friction rub.   No murmur heard. Pulmonary/Chest: Effort normal and breath sounds normal. No respiratory distress. She has no wheezes. She has no rales.  Abdominal: Soft. Bowel sounds are normal. She exhibits no distension and no mass. There is no tenderness. There is no rebound and no guarding.  Musculoskeletal: Normal range of motion. She exhibits no edema and no tenderness.  Neurological: She is alert and oriented to person, place, and time.  Skin: Skin is warm and dry.  Psychiatric: She has a  normal mood and affect.    ED Course  Procedures (including critical care time) Labs Review Labs Reviewed  CBC WITH DIFFERENTIAL - Abnormal; Notable for the following:    WBC 12.0 (*)    All other components within normal limits  BASIC METABOLIC PANEL - Abnormal; Notable for the following:    Potassium 3.4 (*)    Glucose, Bld  147 (*)    GFR calc non Af Amer 87 (*)    All other components within normal limits  PRO B NATRIURETIC PEPTIDE - Abnormal; Notable for the following:    Pro B Natriuretic peptide (BNP) 202.9 (*)    All other components within normal limits  TROPONIN I   Imaging Review Dg Chest 2 View  05/22/2013   CLINICAL DATA:  Chest pain radiating into both arms.  EXAM: CHEST  2 VIEW  COMPARISON:  05/25/2012.  FINDINGS: The cardiopericardial silhouette is borderline in size for projection. Little change compared to prior. Bilateral pleural apical scarring is present. Exaggerated thoracic kyphosis. There are no thoracic compression fractures identified. No airspace disease or pleural effusion identified. Monitoring leads project over the chest. Aortic arch atherosclerosis.  IMPRESSION: Borderline heart size without active cardiopulmonary disease.   Electronically Signed   By: Andreas Newport M.D.   On: 05/22/2013 14:25    EKG Interpretation     Ventricular Rate:  83 PR Interval:  158 QRS Duration: 78 QT Interval:  406 QTC Calculation: 477 R Axis:   15 Text Interpretation:  Normal sinus rhythm Minimal voltage criteria for LVH, may be normal variant Nonspecific ST abnormality Abnormal ECG No significant ST depression as seen on prior            MDM   1. Chest pain    Pt is a 67 y.o. female with Pmhx as above who presents with central chest tightness for about 45 mins, w/ radiation to BL arms, lightheadedness, nausea, diaphoresis.  First EKG w/ questionable ST depression, repeat w/ no acute ischemic changes.  CP resolved w/ 2 SL NTG. First trop negative. CXR, CBC,  BMP, unremarkable. Given pt hx of CAD w/ stent, cardiology consulted, will admit to [facility] under Dr. Eden Emms to tele bed.  Shanna Cisco, MD 05/22/13 [phone]

## 2013-05-22 NOTE — H&P (Signed)
Physician History and Physical         Patient ID: [name] MRN: [id number] DOB/AGE: 09/27/1945 67 y.o. Admit date: 05/22/2013  Primary Care Physician:  Duane Lope, MD Primary Cardiologist: Daleen Squibb to be Tenny Craw   Problem:  Chest Pain  HPI:  68 yo history of CAD Stent to OM in 2012.  Felt ill last 3 weeks Seen by primary Sondra Come.  Synthroid dose increased.  Labs and urine ok.  Has had fatigue , malaise and lightheadedness.  Previous history of such with palpitations.  Echo and monitor benign normal EF just PAC; PVC;s.  Ate lunch today and had severe SSCP.  Similar to pain before stent but worse.  Radiated to right arm then left.  "passed out " before getting to med center [facility].  No acute ECG changes initial enzymes negative Has been on Effient since stent.  Pain free now and nitro patch placed in ER helped.  Compliant with meds.  No dyspnea or GI overtones.  CXR ok in ER No recent trauma No pleuritic or positonal pain.  Has not had stress test since stent.  Intolerant to some statins but taking crestor  Review of systems complete and found to be negative unless listed above   Past Medical History  Diagnosis Date  . Premature ventricular contractions   . Coronary atherosclerosis of native coronary artery     prior DES to the marginal of the LCX in January of 2012  . HLD (hyperlipidemia)   . HTN (hypertension)   . Hiatal hernia   . Hypothyroidism   . Osteoporosis   . Palpitations     Family History  Problem Relation Age of Onset  . Heart attack Brother 40    History   Social History  . Marital Status: Married    Spouse Name: N/A    Number of Children: N/A  . Years of Education: N/A   Occupational History  . Retired Lawyer    Social History Main Topics  . Smoking status: Never Smoker   . Smokeless tobacco: Not on file     Comment: tobacco use- no  . Alcohol Use: No  . Drug Use: No  . Sexual Activity: Yes   Other Topics Concern  . Not on file    Social History Narrative  . No narrative on file    Past Surgical History  Procedure Laterality Date  . Hysterectomy (other)    . Tonsillectomy    . Appendectomy    . Cardiac catheterization    . Coronary angioplasty with stent placement    . Breast surgery    . Abdominal hysterectomy       Prescriptions prior to admission  Medication Sig Dispense Refill  . acetaminophen (TYLENOL) 500 MG tablet Take 1,000 mg by mouth every 6 (six) hours as needed.        Marland Kitchen aspirin (ASPIR-81) 81 MG EC tablet Take 81 mg by mouth daily.        . Calcium Carbonate (CALTRATE 600) 1500 MG TABS Take by mouth 2 (two) times daily.        . cephALEXin (KEFLEX) 250 MG capsule Take 250 mg by mouth 4 (four) times daily. For dental procedures      . Cholecalciferol (VITAMIN D3) 1000 UNITS CAPS Take by mouth daily.        Marland Kitchen esomeprazole (NEXIUM) 40 MG capsule Take 40 mg by mouth daily.        Marland Kitchen levothyroxine (UNITHROID) 50  MCG tablet Take 50 mcg by mouth daily.        . metoprolol tartrate (LOPRESSOR) 25 MG tablet Take 12.5 mg by mouth 2 (two) times daily.      . Multiple Vitamins-Minerals (MULTIVITAL) tablet Take 1 tablet by mouth daily.        . nitroGLYCERIN (NITROSTAT) 0.4 MG SL tablet Place 1 tablet (0.4 mg total) under the tongue every 5 (five) minutes as needed.  25 tablet  6  . prasugrel (EFFIENT) 10 MG TABS Take 1 tablet (10 mg total) by mouth daily.  30 tablet  11  . rosuvastatin (CRESTOR) 20 MG tablet Take 1 tablet (20 mg total) by mouth daily.  30 tablet  11  . valsartan-hydrochlorothiazide (DIOVAN HCT) 160-12.5 MG per tablet Take 1 tablet by mouth daily.          Physical Exam: Blood pressure 111/50, pulse 71, temperature 98.6 F (37 C), temperature source Oral, resp. rate 18, height 5\' 3"  (1.6 m), weight 140 lb 3.4 oz (63.6 kg), SpO2 99.00%.   Affect appropriate Healthy:  appears stated age HEENT: normal Neck supple with no adenopathy JVP normal no bruits no thyromegaly Lungs clear with no  wheezing and good diaphragmatic motion Heart:  S1/S2 no murmur, no rub, gallop or click PMI normal Abdomen: benighn, BS positve, no tenderness, no AAA no bruit.  No HSM or HJR Distal pulses intact with no bruits No edema Neuro non-focal Skin warm and dry No muscular weakness   Labs:   Lab Results  Component Value Date   WBC 12.0* 05/22/2013   HGB 12.7 05/22/2013   HCT 39.0 05/22/2013   MCV 86.1 05/22/2013   PLT 285 05/22/2013    Recent Labs Lab 05/22/13 1325  NA 140  K 3.4*  CL 100  CO2 26  BUN 14  CREATININE 0.70  CALCIUM 9.6  GLUCOSE 147*   Lab Results  Component Value Date   CKTOTAL 66 06/16/2011   CKMB 2.7 06/16/2011   TROPONINI <0.30 05/22/2013    Lab Results  Component Value Date   CHOL 200 09/28/2010   CHOL  Value: 254        ATP III CLASSIFICATION:  <200     mg/dL   Desirable  161-096  mg/dL   Borderline High  >=045    mg/dL   High       * 11/01/8117   CHOL  Value: 303        ATP III CLASSIFICATION:  <200     mg/dL   Desirable  147-829  mg/dL   Borderline High  >=562    mg/dL   High       * 07/29/863   Lab Results  Component Value Date   HDL 50.30 09/28/2010   HDL 44 08/23/2010   HDL 43 01/31/4695   Lab Results  Component Value Date   LDLCALC  Value: 161        Total Cholesterol/HDL:CHD Risk Coronary Heart Disease Risk Table                     Men   Women  1/2 Average Risk   3.4   3.3  Average Risk       5.0   4.4  2 X Average Risk   9.6   7.1  3 X Average Risk  23.4   11.0        Use the calculated Patient Ratio above and the CHD Risk Table  to determine the patient's CHD Risk.        ATP III CLASSIFICATION (LDL):  <100     mg/dL   Optimal  956-213  mg/dL   Near or Above                    Optimal  130-159  mg/dL   Borderline  086-578  mg/dL   High  >469     mg/dL   Very High* 01/22/5283   LDLCALC  Value: 216        Total Cholesterol/HDL:CHD Risk Coronary Heart Disease Risk Table                     Men   Women  1/2 Average Risk   3.4   3.3  Average Risk        5.0   4.4  2 X Average Risk   9.6   7.1  3 X Average Risk  23.4   11.0        Use the calculated Patient Ratio above and the CHD Risk Table to determine the patient's CHD Risk.        ATP III CLASSIFICATION (LDL):  <100     mg/dL   Optimal  132-440  mg/dL   Near or Above                    Optimal  130-159  mg/dL   Borderline  102-725  mg/dL   High  >366     mg/dL   Very High* 10/27/345   LDLCALC  Value: 212        Total Cholesterol/HDL:CHD Risk Coronary Heart Disease Risk Table                     Men   Women  1/2 Average Risk   3.4   3.3* 04/05/2008   Lab Results  Component Value Date   TRIG 240.0* 09/28/2010   TRIG 245* 08/23/2010   TRIG 220* 08/03/2010   Lab Results  Component Value Date   CHOLHDL 4 09/28/2010   CHOLHDL 5.8 08/23/2010   CHOLHDL 7.0 08/03/2010   Lab Results  Component Value Date   LDLDIRECT 121.5 09/28/2010      Radiology: Dg Chest 2 View  05/22/2013   CLINICAL DATA:  Chest pain radiating into both arms.  EXAM: CHEST  2 VIEW  COMPARISON:  05/25/2012.  FINDINGS: The cardiopericardial silhouette is borderline in size for projection. Little change compared to prior. Bilateral pleural apical scarring is present. Exaggerated thoracic kyphosis. There are no thoracic compression fractures identified. No airspace disease or pleural effusion identified. Monitoring leads project over the chest. Aortic arch atherosclerosis.  IMPRESSION: Borderline heart size without active cardiopulmonary disease.   Electronically Signed   By: Andreas Newport M.D.   On: 05/22/2013 14:25    EKG: x2 SR no acute changes   ASSESSMENT AND PLAN:   Chest Pain:  Similar to previous angina.  OM lesion can be silent on ECG.  Relief with nitro.  Discussed with family Recommended cath.  Patient and family in agreement.  Start heparin if recurrent  Pain or f/u enzymes positive Continue ASA/Effient and beta blocker Thyroid:  Continue higher dose of synthroid 75 ug as prescribed by Dr Tenny Craw Chol:  Continue crestor LFTls  ok with Dr Tenny Craw.   HTN:  Continue ARB hold diuretic for cath.   SignedTheron Arista Nishan10/28/2014, 5:50 PM

## 2013-05-23 ENCOUNTER — Encounter (HOSPITAL_COMMUNITY)
Admission: EM | Disposition: A | Discharge status: Home / Self Care | Payer: Self-pay | Source: Home / Self Care | Attending: Cardiovascular Disease

## 2013-05-23 DIAGNOSIS — I2510 Atherosclerotic heart disease of native coronary artery without angina pectoris: Secondary | ICD-10-CM

## 2013-05-23 DIAGNOSIS — I200 Unstable angina: Secondary | ICD-10-CM | POA: Diagnosis not present

## 2013-05-23 DIAGNOSIS — I10 Essential (primary) hypertension: Secondary | ICD-10-CM | POA: Diagnosis not present

## 2013-05-23 DIAGNOSIS — E785 Hyperlipidemia, unspecified: Secondary | ICD-10-CM | POA: Diagnosis not present

## 2013-05-23 HISTORY — PX: LEFT HEART CATHETERIZATION WITH CORONARY ANGIOGRAM: SHX5451

## 2013-05-23 HISTORY — PX: CORONARY ANGIOPLASTY WITH STENT PLACEMENT: SHX49

## 2013-05-23 LAB — BASIC METABOLIC PANEL
Chloride: 103 mEq/L (ref 96–112)
GFR calc Af Amer: >90 mL/min (ref >90)
GFR calc non Af Amer: 89 mL/min — ABNORMAL LOW (ref >90)
Glucose, Bld: 89 mg/dL (ref 70–99)
Potassium: 3.4 mEq/L — ABNORMAL LOW (ref 3.5–5.1)
Sodium: 140 mEq/L (ref 135–145)

## 2013-05-23 LAB — CBC
HCT: 33.4 % — ABNORMAL LOW (ref 36.0–46.0)
Hemoglobin: 11.1 g/dL — ABNORMAL LOW (ref 12.0–15.0)
MCH: 28.3 pg (ref 26.0–34.0)
MCV: 85.2 fL (ref 78.0–100.0)
Platelets: 241 10*3/uL (ref 150–400)
RBC: 3.92 MIL/uL (ref 3.87–5.11)
WBC: 9.6 10*3/uL (ref 4.0–10.5)

## 2013-05-23 LAB — TSH: TSH: 1.896 u[IU]/mL (ref 0.350–4.500)

## 2013-05-23 LAB — LIPID PANEL
Cholesterol: 170 mg/dL (ref 0–200)
HDL: 49 mg/dL (ref >39)
Total CHOL/HDL Ratio: 3.5 RATIO
Triglycerides: 227 mg/dL — ABNORMAL HIGH (ref <150)

## 2013-05-23 LAB — POCT ACTIVATED CLOTTING TIME: Activated Clotting Time: 191 seconds

## 2013-05-23 LAB — TROPONIN I: Troponin I: <0.3 ng/mL (ref <0.30)

## 2013-05-23 SURGERY — LEFT HEART CATHETERIZATION WITH CORONARY ANGIOGRAM
Anesthesia: LOCAL

## 2013-05-23 MED ORDER — LIDOCAINE HCL (PF) 1 % IJ SOLN
INTRAMUSCULAR | Status: AC
  Filled 2013-05-23: qty 30

## 2013-05-23 MED ORDER — POTASSIUM CHLORIDE CRYS ER 20 MEQ PO TBCR
40.0000 meq | EXTENDED_RELEASE_TABLET | Freq: Once | ORAL | Status: AC
  Administered 2013-05-23: 40 meq via ORAL
  Filled 2013-05-23: qty 2

## 2013-05-23 MED ORDER — MIDAZOLAM HCL 2 MG/2ML IJ SOLN
INTRAMUSCULAR | Status: AC
  Filled 2013-05-23: qty 2

## 2013-05-23 MED ORDER — HEPARIN SODIUM (PORCINE) 1000 UNIT/ML IJ SOLN
INTRAMUSCULAR | Status: AC
  Filled 2013-05-23: qty 1

## 2013-05-23 MED ORDER — FENTANYL CITRATE 0.05 MG/ML IJ SOLN
INTRAMUSCULAR | Status: AC
  Filled 2013-05-23: qty 2

## 2013-05-23 MED ORDER — PRASUGREL HCL 10 MG PO TABS
ORAL_TABLET | ORAL | Status: AC
  Filled 2013-05-23: qty 5

## 2013-05-23 MED ORDER — PRASUGREL HCL 10 MG PO TABS
ORAL_TABLET | ORAL | Status: AC
  Administered 2013-05-23: 10 mg via ORAL
  Filled 2013-05-23: qty 1

## 2013-05-23 MED ORDER — ADENOSINE 12 MG/4ML IV SOLN
12.0000 mL | INTRAVENOUS | Status: DC
  Filled 2013-05-23: qty 12

## 2013-05-23 MED ORDER — NITROGLYCERIN 0.2 MG/ML ON CALL CATH LAB
INTRAVENOUS | Status: AC
  Filled 2013-05-23: qty 1

## 2013-05-23 MED ORDER — HYDROCORTISONE 1 % EX CREA
TOPICAL_CREAM | Freq: Three times a day (TID) | CUTANEOUS | Status: DC
  Administered 2013-05-23 – 2013-05-24 (×2): via TOPICAL
  Filled 2013-05-23: qty 28

## 2013-05-23 MED ORDER — HEPARIN (PORCINE) IN NACL 2-0.9 UNIT/ML-% IJ SOLN
INTRAMUSCULAR | Status: AC
  Filled 2013-05-23: qty 1500

## 2013-05-23 MED ORDER — SODIUM CHLORIDE 0.9 % IV SOLN
INTRAVENOUS | Status: AC
  Administered 2013-05-23 (×2): via INTRAVENOUS

## 2013-05-23 MED ORDER — ASPIRIN 81 MG PO CHEW
81.0000 mg | CHEWABLE_TABLET | Freq: Every day | ORAL | Status: DC
Start: 2013-05-24 — End: 2013-05-24
  Administered 2013-05-24: 81 mg via ORAL
  Filled 2013-05-23: qty 1

## 2013-05-23 NOTE — CV Procedure (Signed)
Cardiac Catheterization Procedure Note  Name: [name] MRN: [id number] DOB: 02-12-1946  Procedure: Left Heart Cath, Selective Coronary Angiography, LV angiography, pressure wire interrogation of the right coronary artery, drug-eluting stent placement to the right coronary artery.   Indication: Suspected unstable angina with known history of coronary artery disease with previous stenting of the left circumflex.  Medications:  Sedation:  4 mg IV Versed, 125 mcg IV Fentanyl  Contrast:  110 ml Omnipaque  Procedural Details: The left wrist was prepped, draped, and anesthetized with 1% lidocaine. Using the modified Seldinger technique, a 5 French Slender sheath was introduced into the left radial artery. 3 mg of verapamil was administered through the sheath, weight-based unfractionated heparin was administered intravenously. A Jackie catheter was used for selective coronary angiography. A JL 4 catheter was used to engage the left coronary artery. A pigtail catheter was used for left ventriculography. Catheter exchanges were performed over an exchange length guidewire. There were no immediate procedural complications.  Procedural Findings:  Hemodynamics: AO:  150/63   mmHg LV:  154/6    mmHg LVEDP: 10  mmHg  Coronary angiography: Coronary dominance: Right   Left Main:  Normal  Left Anterior Descending (LAD):  Normal in size with mild calcifications. The vessel has minor irregularities throughout its course.  1st diagonal (D1):  Normal in size with 40% ostial stenosis.  2nd diagonal (D2):  Small in size with no significant disease.  3rd diagonal (D3):  Normal in size with no significant disease.  Circumflex (LCx):  Normal in size and nondominant. A long stent is noted in the proximal left circumflex which is patent with diffuse 10-20% in-stent restenosis.  1st obtuse marginal:  Very small in size  2nd obtuse marginal:  Very small in size.  3rd obtuse marginal:  Normal in  size with minor irregularities.    Right Coronary Artery: Normal in in size and dominant. The vessel is mildly calcified especially in the midsegment. There is 20% proximal stenosis. There is 60-70% mid stenosis. The vessel bifurcates more proximal than usual into the PDA which is normal in size with no significant disease.  Posterior AV segment: Normal in size with 60% stenosis distally at the origin of the PL branch  Posterolateral branchs:  PL on a small in size with minor irregularities. PO2 is large in size with no significant disease.  Left ventriculography: Left ventricular systolic function is normal , LVEF is estimated at 65-70 %, there is no significant mitral regurgitation   PCI Note:  the stenosis in the mid RCA appears to be borderline significant. Initially, I was not convinced that this would be responsible for unstable angina. Thus, I decided to proceed with pressure wire interrogation. The patient was given additional heparin for therapeutic ACT. A total of 6000 units of heparin was given. A JR 4 guide was used to engage the vessel. The lesion was crossed easily with a Run Through wire. The Acist catheter was then used in the standard fashion across the stenosis. Resting gradient was recorded then we started adenosine intravenously at 140 mcg per kilogram per minute. Pressure was recorded. FFR at maximal hyperemia was 0.73.  The decision was made to proceed with PCI.   The lesion was then stented with a 3.0 x 15 mm Xience drug-eluting  stent.   there was a significant waist in the lesion. This was difficult to be crossed with a noncompliant balloon. I was able to cross with a 30 by 12 noncompliant balloon.  The stent was postdilated to 14 atmospheres with resolution of the waist and excellent results. Interestingly, the patient had severe chest pain with a very balloon inflation with associated inferior ST elevation on the monitor.  Following PCI, there was 0% residual stenosis and TIMI-3  flow. Final angiography confirmed an excellent result. The patient tolerated the procedure well. There were no immediate procedural complications. A TR band was used for radial hemostasis. The patient was transferred to the post catheterization recovery area for further monitoring.  PCI Data: Vessel - mid RCA /Segment - 2 Percent Stenosis (pre)  70%  (with FFR ratio of 0.73) TIMI-flow 3  Stent 3.0 x 15 mm  Xience drug-eluting stent  Percent Stenosis (post) 0%  TIMI-flow (post) 3   Final Conclusions:  1. Significant 2 vessel coronary artery disease with widely patent stent in the proximal left circumflex. Significant mid RCA stenosis is likely the culprit for symptoms. This was significant by pressure wire interrogation with an FFR ratio of 0.73. 2. Normal LV systolic function. 3. Successful drug-eluting stent placement to the mid RCA.  Recommendations:  Continue dual antiplatelet therapy for at least 12 months.  Lorine Bears MD, [facility] 05/23/2013, 2:32 PM

## 2013-05-23 NOTE — Progress Notes (Signed)
Pt TR band deflated from 11cc's to 0cc's at 1820. Very minimal oozing noted at site, stopped oozing before reached level 0. Pt hand dusky initially, is now pink and warm. TR band still in place with 0cc's, report and care given to nightshift RN.   Delynn Flavin, RN

## 2013-05-23 NOTE — Progress Notes (Addendum)
Pt arrived to floor on RA, VSS. Family present, oriented to room. TR band in place with 11cc's at 1430 on left wrist. Left hand is cool and slightly duskier, pt reports good feeling in all fingers. Will monitor.   Denies pain/nausea, given some food to test stomach.   Delynn Flavin, RN

## 2013-05-23 NOTE — Progress Notes (Signed)
   SUBJECTIVE: prolonged chest pain yesterday. CP free now. Previous OM PCI.    Filed Vitals:   05/22/13 1921 05/22/13 2100 05/23/13 0508 05/23/13 0915  BP: 128/63 103/57 135/73 150/68  Pulse:  64 64 78  Temp:  98.4 F (36.9 C) 98.8 F (37.1 C)   TempSrc:  Oral    Resp:  18 18   Height:      Weight:   64.6 kg (142 lb 6.7 oz)   SpO2:  98% 100%     Intake/Output Summary (Last 24 hours) at 05/23/13 1311 Last data filed at 05/22/13 2106  Gross per 24 hour  Intake      3 ml  Output    110 ml  Net   -107 ml    LABS: Basic Metabolic Panel:  Recent Labs  86/57/84 1325 05/23/13 0520  NA 140 140  K 3.4* 3.4*  CL 100 103  CO2 26 28  GLUCOSE 147* 89  BUN 14 12  CREATININE 0.70 0.66  CALCIUM 9.6 9.2   Liver Function Tests: No results found for this basename: AST, ALT, ALKPHOS, BILITOT, PROT, ALBUMIN,  in the last 72 hours No results found for this basename: LIPASE, AMYLASE,  in the last 72 hours CBC:  Recent Labs  05/22/13 1325 05/23/13 0520  WBC 12.0* 9.6  NEUTROABS 7.6  --   HGB 12.7 11.1*  HCT 39.0 33.4*  MCV 86.1 85.2  PLT 285 241   Cardiac Enzymes:  Recent Labs  05/22/13 1930 05/22/13 2320 05/23/13 0520  TROPONINI <0.30 <0.30 <0.30   BNP: No components found with this basename: POCBNP,  D-Dimer: No results found for this basename: DDIMER,  in the last 72 hours Hemoglobin A1C: No results found for this basename: HGBA1C,  in the last 72 hours Fasting Lipid Panel:  Recent Labs  05/23/13 0520  CHOL 170  HDL 49  LDLCALC 76  TRIG 227*  CHOLHDL 3.5   Thyroid Function Tests:  Recent Labs  05/22/13 1930  TSH 1.896   Anemia Panel: No results found for this basename: VITAMINB12, FOLATE, FERRITIN, TIBC, IRON, RETICCTPCT,  in the last 72 hours   PHYSICAL EXAM General: Well developed, well nourished, in no acute distress HEENT:  Normocephalic and atramatic Neck:  No JVD.  Lungs: Clear bilaterally to auscultation and percussion. Heart: HRRR  . Normal S1 and S2 without gallops or murmurs.  Abdomen: Bowel sounds are positive, abdomen soft and non-tender  Msk:  Back normal, normal gait. Normal strength and tone for age. Extremities: No clubbing, cyanosis or edema.   Neuro: Alert and oriented X 3. Psych:  Good affect, responds appropriately  TELEMETRY: Reviewed telemetry pt in NSR  ASSESSMENT AND PLAN: Chest Pain: possible unstable angina. Relief with nitro. Cardiac cath today.  Pain or f/u enzymes positive Continue ASA/Effient and beta blocker  Thyroid: Continue higher dose of synthroid 75 ug as prescribed by Dr Tenny Craw  Chol: Continue crestor LFTls ok with Dr Tenny Craw.  HTN: Continue ARB hold diuretic for cath.    Lorine Bears, MD, [facility] 05/23/2013 1:11 PM

## 2013-05-24 ENCOUNTER — Encounter (HOSPITAL_COMMUNITY): Payer: Self-pay | Admitting: Physician Assistant

## 2013-05-24 DIAGNOSIS — Z955 Presence of coronary angioplasty implant and graft: Secondary | ICD-10-CM

## 2013-05-24 DIAGNOSIS — I10 Essential (primary) hypertension: Secondary | ICD-10-CM | POA: Diagnosis not present

## 2013-05-24 DIAGNOSIS — E876 Hypokalemia: Secondary | ICD-10-CM

## 2013-05-24 DIAGNOSIS — R079 Chest pain, unspecified: Secondary | ICD-10-CM

## 2013-05-24 DIAGNOSIS — E785 Hyperlipidemia, unspecified: Secondary | ICD-10-CM | POA: Diagnosis not present

## 2013-05-24 DIAGNOSIS — I200 Unstable angina: Secondary | ICD-10-CM

## 2013-05-24 DIAGNOSIS — I2510 Atherosclerotic heart disease of native coronary artery without angina pectoris: Secondary | ICD-10-CM | POA: Diagnosis not present

## 2013-05-24 LAB — BASIC METABOLIC PANEL
BUN: 8 mg/dL (ref 6–23)
CO2: 23 mEq/L (ref 19–32)
Calcium: 8.9 mg/dL (ref 8.4–10.5)
Chloride: 108 mEq/L (ref 96–112)
Creatinine, Ser: 0.69 mg/dL (ref 0.50–1.10)
GFR calc Af Amer: >90 mL/min (ref >90)
Sodium: 142 mEq/L (ref 135–145)

## 2013-05-24 LAB — CBC
HCT: 34 % — ABNORMAL LOW (ref 36.0–46.0)
MCH: 27.8 pg (ref 26.0–34.0)
MCV: 86.7 fL (ref 78.0–100.0)
Platelets: 217 10*3/uL (ref 150–400)
RBC: 3.92 MIL/uL (ref 3.87–5.11)

## 2013-05-24 MED ORDER — PRASUGREL HCL 10 MG PO TABS: 10.0000 mg | ORAL_TABLET | Freq: Every day | ORAL | Status: DC

## 2013-05-24 NOTE — Progress Notes (Signed)
Patient ID: [name], female   DOB: 1946/04/22, 67 y.o.   MRN: [id number]    Subjective:  Denies SSCP, palpitations or Dyspnea Left raidal hurts a bit  Objective:  Filed Vitals:   05/23/13 2045 05/24/13 0000 05/24/13 0200 05/24/13 0354  BP: 107/50 121/48 143/65 159/76  Pulse: 78 75 75 80  Temp: 97.9 F (36.6 C) 98.2 F (36.8 C)  97.6 F (36.4 C)  TempSrc: Oral Oral  Oral  Resp: 17 18 18 20   Height:      Weight:  144 lb 6.4 oz (65.5 kg)    SpO2: 95% 94% 94% 97%    Intake/Output from previous day:  Intake/Output Summary (Last 24 hours) at 05/24/13 0739 Last data filed at 05/24/13 0409  Gross per 24 hour  Intake 2203.55 ml  Output    650 ml  Net 1553.55 ml    Physical Exam: Affect appropriate Healthy:  appears stated age HEENT: normal Neck supple with no adenopathy JVP normal no bruits no thyromegaly Lungs clear with no wheezing and good diaphragmatic motion Heart:  S1/S2 no murmur, no rub, gallop or click PMI normal Abdomen: benighn, BS positve, no tenderness, no AAA no bruit.  No HSM or HJR Distal pulses intact with no bruits No edema Neuro non-focal Skin warm and dry No muscular weakness   Lab Results: Basic Metabolic Panel:  Recent Labs  40/98/11 0520 05/24/13 0505  NA 140 142  K 3.4* 3.6  CL 103 108  CO2 28 23  GLUCOSE 89 86  BUN 12 8  CREATININE 0.66 0.69  CALCIUM 9.2 8.9   CBC:  Recent Labs  05/22/13 1325 05/23/13 0520 05/24/13 0505  WBC 12.0* 9.6 8.7  NEUTROABS 7.6  --   --   HGB 12.7 11.1* 10.9*  HCT 39.0 33.4* 34.0*  MCV 86.1 85.2 86.7  PLT 285 241 217   Cardiac Enzymes:  Recent Labs  05/22/13 1930 05/22/13 2320 05/23/13 0520  TROPONINI <0.30 <0.30 <0.30   Fasting Lipid Panel:  Recent Labs  05/23/13 0520  CHOL 170  HDL 49  LDLCALC 76  TRIG 227*  CHOLHDL 3.5   Thyroid Function Tests:  Recent Labs  05/22/13 1930  TSH 1.896    Imaging: Dg Chest 2 View  05/22/2013   CLINICAL DATA:  Chest pain  radiating into both arms.  EXAM: CHEST  2 VIEW  COMPARISON:  05/25/2012.  FINDINGS: The cardiopericardial silhouette is borderline in size for projection. Little change compared to prior. Bilateral pleural apical scarring is present. Exaggerated thoracic kyphosis. There are no thoracic compression fractures identified. No airspace disease or pleural effusion identified. Monitoring leads project over the chest. Aortic arch atherosclerosis.  IMPRESSION: Borderline heart size without active cardiopulmonary disease.   Electronically Signed   By: Andreas Newport M.D.   On: 05/22/2013 14:25    Cardiac Studies:  ECG: SR no acute changes   Telemetry:  NSR no VT    Echo:   Medications:   . aspirin  81 mg Oral Daily  . calcium carbonate  1 tablet Oral BID WC  . cholecalciferol  1,000 Units Oral Daily  . enoxaparin (LOVENOX) injection  40 mg Subcutaneous Q24H  . hydrocortisone cream   Topical TID  . levothyroxine  50 mcg Oral QAC breakfast  . metoprolol tartrate  12.5 mg Oral BID  . multivitamin with minerals  1 tablet Oral Daily  . pantoprazole  80 mg Oral Q1200  . prasugrel  10 mg Oral Daily  .  rosuvastatin  20 mg Oral QPM  . sodium chloride  3 mL Intravenous Q12H       Assessment/Plan:  CAD:  S/P PCI of RCA  Stent OM patent continue ASA and Effient for one year.  F/U with Dr [name] In 8 weeks Thyroid:  F/U [name] continue higher dose synthroid Chol:  Continue statin  [name] 05/24/2013, 7:39 AM

## 2013-05-24 NOTE — Progress Notes (Signed)
TR BAND REMOVAL  LOCATION:    left radial  DEFLATED PER PROTOCOL:    yes  TIME BAND OFF / DRESSING APPLIED:    19:30   SITE UPON ARRIVAL:    Level 1  SITE AFTER BAND REMOVAL:    Level 1  REVERSE ALLEN'S TEST:     positive  CIRCULATION SENSATION AND MOVEMENT:    Within Normal Limits   yes  COMMENTS:   Swelling & bruise around the site

## 2013-05-24 NOTE — Discharge Summary (Signed)
Discharge Summary   Patient ID: [name],  MRN: [id number], DOB/AGE: 08-29-1945 67 y.o.  Admit date: 05/22/2013 Discharge date: 05/24/2013  Primary Physician:  Duane Lope, MD Primary Cardiologist: Lovina Reach, MD  Discharge Diagnoses Principal Problem:   Unstable angina  - Cardiac cath 05/23/13: Patent LCx stent (10-20% ISR), 70% mid RCA (FFR 0.73) s/p DES, 20% prox RCA, 40% ostial D1, 60% distal posterior AV branch; EF 65-70%  - DAPT- ASA/Effient x 12 months Active Problems:   CAD, NATIVE VESSEL  - Discharged on ASA/Effient/ARB/BB/statin/NTG SL PRN   S/P primary angioplasty with coronary stent   HYPERLIPIDEMIA  - LDL 76, HDL 49, TG 227, TC 170   HYPERTENSION   HYPOTHYROIDISM  - Follow-up PCP   Hypokalemia  - 3.4 on admission->KCl 40 mEq PO x 1->3.6 this AM  Allergies Allergies  Allergen Reactions  . Atorvastatin     REACTION: myaligia  . Codeine   . Flagyl [Metronidazole Hcl]   . Simvastatin     REACTION: myalgia  . Sulfa Antibiotics   . Metronidazole Nausea And Vomiting and Rash  . Penicillins Nausea And Vomiting and Rash    Diagnostic Studies/Procedures  PA/LATERAL CHEST X-RAY - 05/22/13  IMPRESSION:  Borderline heart size without active cardiopulmonary disease.   CARDIAC CATHETERIZATION + PERCUTANEOUS CORONARY INTERVENTION - 05/23/13  Hemodynamics:  AO: 150/63 mmHg  LV: 154/6 mmHg  LVEDP: 10 mmHg   Coronary angiography:  Coronary dominance: Right  Left Main: Normal  Left Anterior Descending (LAD): Normal in size with mild calcifications. The vessel has minor irregularities throughout its course.  1st diagonal (D1): Normal in size with 40% ostial stenosis.  2nd diagonal (D2): Small in size with no significant disease.  3rd diagonal (D3): Normal in size with no significant disease.  Circumflex (LCx): Normal in size and nondominant. A long stent is noted in the proximal left circumflex which is patent with diffuse 10-20% in-stent restenosis.  1st  obtuse marginal: Very small in size  2nd obtuse marginal: Very small in size.  3rd obtuse marginal: Normal in size with minor irregularities.  Right Coronary Artery: Normal in in size and dominant. The vessel is mildly calcified especially in the midsegment. There is 20% proximal stenosis. There is 60-70% mid stenosis. The vessel bifurcates more proximal than usual into the PDA which is normal in size with no significant disease.  Posterior AV segment: Normal in size with 60% stenosis distally at the origin of the PL branch  Posterolateral branchs: PL on a small in size with minor irregularities. PO2 is large in size with no significant disease. Left ventriculography: Left ventricular systolic function is normal , LVEF is estimated at 65-70 %, there is no significant mitral regurgitation   PCI Data:  Vessel - mid RCA /Segment - 2  Percent Stenosis (pre) 70% (with FFR ratio of 0.73)  TIMI-flow 3  Stent 3.0 x 15 mm Xience drug-eluting stent  Percent Stenosis (post) 0%  TIMI-flow (post) 3   Final Conclusions:  1. Significant 2 vessel coronary artery disease with widely patent stent in the proximal left circumflex. Significant mid RCA stenosis is likely the culprit for symptoms. This was significant by pressure wire interrogation with an FFR ratio of 0.73.  2. Normal LV systolic function.  3. Successful drug-eluting stent placement to the mid RCA.  History of Present Illness  [name] is a 67 y.o. female who was admitted to [facility] hospital from 05/22/13 to 05/24/13 with the above problem list.  She has a prior history of CAD (s/p PCI-OM), HTN, HLD and hypothyroidism. She had a history of palpitations with cardiac monitoring revealing only PACs. She had felt ill for the preceding 3 weeks leading up to admission described as fatigue, malaise and lightheadedness. Levothyroxine was increased by her PCP. She consumed lunch the date of admission and c/o severe substernal chest pain,  reminiscent of prior anginal discomfort, radiating to her R, then L arm. She syncopized prior to presented to [facility] for further evaluation.   There, initial TnI returned WNL. EKG x 2 revealed no acute ischemic changes. She was treated with NTG with some relief. BMET did indicate a mild hypokalemia (3.4). Pro BNP mildly elevated at 202.9. CBC- WBC 12.0K, otherwise WNL. PA/lateral CXR showed no acute process as above, borderline heart size.   The recommendation was made to proceed with cardiac catheterization. The risks, benefits and details of the procedure were explained to the patient and her family who agreed to proceed.   Hospital Course   She was subsequently admitted. Three further troponins returned WNL. TSH, free T4 WNL. Lipid panel as above. She was mildly hypokalemic the following morning as well.  This was suspected to be secondary to her outpatient diuretic. This was successfully repleted with oral KCl.   She was prepped for the procedure. Access was obtained from the R radial artery. She underwent DES-mid RCA as above. LCx stent patent and EF preserved. She tolerated the procedure well without complications. The recommendation was made to resume DAPT- ASA/Effient x 12 months.   She was observed for an additional night. There were no acute events. Potassium this morning was WNL. She had evidence of a new normocytic anemia this morning felt to be due to small amount of blood loss from cath and hemodilution from IVF. WBC count WNL. She ambulated well with cardiac rehab and will pursue phase 2 as an outpatient. She was evaluated by Dr. Eden Emms this morning and deemed to be stable for discharge. She will be discharged on the medication regimen outlined below. She will follow-up with Dr. Dietrich Pates in 8 weeks (appointment below). She has been advised to follow-up with her PCP for ongoing thyroid management. This information, including post-cath instructions and activity restrictions, has been clearly  outlined in the discharge AVS.   Discharge Vitals:  Blood pressure 167/70, pulse 79, temperature 98.3 F (36.8 C), temperature source Oral, resp. rate 18, height 5\' 3"  (1.6 m), weight 65.5 kg (144 lb 6.4 oz), SpO2 97.00%.   Weight change: 1.996 kg (4 lb 6.4 oz)  Labs: Recent Labs     05/23/13  0520  05/24/13  0505  WBC  9.6  8.7  HGB  11.1*  10.9*  HCT  33.4*  34.0*  MCV  85.2  86.7  PLT  241  217   Recent Labs Lab 05/22/13 1325 05/23/13 0520 05/24/13 0505  NA 140 140 142  K 3.4* 3.4* 3.6  CL 100 103 108  CO2 26 28 23   BUN 14 12 8   CREATININE 0.70 0.66 0.69  CALCIUM 9.6 9.2 8.9  GLUCOSE 147* 89 86   Recent Labs     05/22/13  1930  05/22/13  2320  05/23/13  0520  TROPONINI  <0.30  <0.30  <0.30   Recent Labs     05/23/13  0520  CHOL  170  HDL  49  LDLCALC  76  TRIG  227*  CHOLHDL  3.5    Recent Labs  05/22/13  1930  TSH 1.896    Disposition:  Discharge Orders   Future Appointments Provider Department Dept Phone   07/13/2013 11:45 AM Pricilla Riffle, MD [facility] [place] Office [phone]   Future Orders Complete By Expires   Amb Referral to Cardiac Rehabilitation  As directed    Diet - low sodium heart healthy  As directed    Increase activity slowly  As directed          Follow-up Information   Follow up with Dietrich Pates, MD On 07/13/2013. (At 11:45 AM for cardiology follow-up. )    Specialty:  Cardiology   Contact information:   [address] [place] [address] [phone]       Follow up with  Duane Lope, MD. Schedule an appointment as soon as possible for a visit in 1 week. (For general post-hospital follow-up and thyroid management. )    Specialty:  Family Medicine   Contact information:   [address] [place] [address] [phone]      Discharge Medications:    Medication List         ASPIR-81 81 MG EC tablet  Generic drug:  aspirin  Take 81 mg by mouth daily.     CALTRATE 600 1500 MG  Tabs  Generic drug:  Calcium Carbonate  Take by mouth 2 (two) times daily.     cephALEXin 250 MG capsule  Commonly known as:  KEFLEX  Take 250 mg by mouth 4 (four) times daily. For dental procedures     cholecalciferol 1000 UNITS tablet  Commonly known as:  VITAMIN D  Take 1,000 Units by mouth daily.     DIOVAN HCT 160-12.5 MG per tablet  Generic drug:  valsartan-hydrochlorothiazide  Take 1 tablet by mouth daily.     levothyroxine 75 MCG tablet  Commonly known as:  SYNTHROID, LEVOTHROID  Take 75 mcg by mouth daily before breakfast.     metoprolol tartrate 25 MG tablet  Commonly known as:  LOPRESSOR  Take 12.5 mg by mouth 2 (two) times daily.     MULTIVITAL tablet  Take 1 tablet by mouth daily.     NEXIUM 40 MG capsule  Generic drug:  esomeprazole  Take 40 mg by mouth daily.     nitroGLYCERIN 0.4 MG SL tablet  Commonly known as:  NITROSTAT  Place 0.4 mg under the tongue every 5 (five) minutes as needed for chest pain.     prasugrel 10 MG Tabs tablet  Commonly known as:  EFFIENT  Take 1 tablet (10 mg total) by mouth daily.     rosuvastatin 20 MG tablet  Commonly known as:  CRESTOR  Take 20 mg by mouth daily.       Outstanding Labs/Studies: None  Duration of Discharge Encounter: Greater than 30 minutes including physician time.  Signed, R. Hurman Horn, PA-C 05/24/2013, 9:25 AM

## 2013-05-24 NOTE — Progress Notes (Signed)
CARDIAC REHAB PHASE I   PRE:  Rate/Rhythm: 87SR  BP:  Supine:   Sitting: 167/70  Standing:    SaO2:   MODE:  Ambulation: 300 ft   POST:  Rate/Rhythm: 88SR  BP:  Supine:   Sitting: 186/66, 183/71  Standing:    SaO2:  1610-9604 Pt walked 300 ft with asst x1 stopping twice due to fatigue. No CP or dizziness. BP elevated. Education completed with pt and family. Understanding voiced. Pt ready to do Phase 2 [place] now. Will refer. Encouraged to increase activity slowly due to fatigue.   Luetta Nutting, RN BSN  05/24/2013 8:37 AM

## 2013-05-31 DIAGNOSIS — E039 Hypothyroidism, unspecified: Secondary | ICD-10-CM | POA: Diagnosis not present

## 2013-05-31 DIAGNOSIS — F430 Acute stress reaction: Secondary | ICD-10-CM | POA: Diagnosis not present

## 2013-06-19 ENCOUNTER — Other Ambulatory Visit: Payer: Self-pay | Admitting: Nurse Practitioner

## 2013-07-04 DIAGNOSIS — Z09 Encounter for follow-up examination after completed treatment for conditions other than malignant neoplasm: Secondary | ICD-10-CM | POA: Diagnosis not present

## 2013-07-04 DIAGNOSIS — N63 Unspecified lump in unspecified breast: Secondary | ICD-10-CM | POA: Diagnosis not present

## 2013-07-12 DIAGNOSIS — R350 Frequency of micturition: Secondary | ICD-10-CM | POA: Diagnosis not present

## 2013-07-12 DIAGNOSIS — N390 Urinary tract infection, site not specified: Secondary | ICD-10-CM | POA: Diagnosis not present

## 2013-07-13 ENCOUNTER — Ambulatory Visit (INDEPENDENT_AMBULATORY_CARE_PROVIDER_SITE_OTHER): Payer: Medicare Other | Admitting: Internal Medicine

## 2013-07-13 ENCOUNTER — Encounter: Payer: Self-pay | Admitting: Internal Medicine

## 2013-07-13 VITALS — BP 136/84 | HR 82 | Ht 63.0 in | Wt 139.0 lb

## 2013-07-13 DIAGNOSIS — I2510 Atherosclerotic heart disease of native coronary artery without angina pectoris: Secondary | ICD-10-CM | POA: Diagnosis not present

## 2013-07-13 LAB — CBC WITH DIFFERENTIAL/PLATELET
Basophils Relative: 0.4 % (ref 0.0–3.0)
Eosinophils Absolute: 0.2 10*3/uL (ref 0.0–0.7)
HCT: 35.8 % — ABNORMAL LOW (ref 36.0–46.0)
Hemoglobin: 11.8 g/dL — ABNORMAL LOW (ref 12.0–15.0)
Lymphocytes Relative: 23.6 % (ref 12.0–46.0)
MCHC: 33 g/dL (ref 30.0–36.0)
MCV: 82.6 fl (ref 78.0–100.0)
Monocytes Absolute: 0.5 10*3/uL (ref 0.1–1.0)
Neutro Abs: 7.2 10*3/uL (ref 1.4–7.7)
RBC: 4.34 Mil/uL (ref 3.87–5.11)

## 2013-07-13 LAB — BASIC METABOLIC PANEL
BUN: 13 mg/dL (ref 6–23)
CO2: 31 mEq/L (ref 19–32)
Chloride: 103 mEq/L (ref 96–112)
Creatinine, Ser: 0.7 mg/dL (ref 0.4–1.2)
GFR: 88.17 mL/min (ref >60.00)

## 2013-07-13 MED ORDER — CIPROFLOXACIN HCL 250 MG PO TABS: 250.0000 mg | ORAL_TABLET | Freq: Two times a day (BID) | ORAL | Status: DC

## 2013-07-13 NOTE — Progress Notes (Signed)
HPI  Patient is a 67 yo with a history of CAD and palpitations.  She was last seen in clinic by T Wall in January   She was admitted to [facility] in October with unstable angina.  Cath showed Mild dz of LAD, patient stent to LCx with 10 to 20% instent restenosis.  The RCA had a 60 to 70% mid lesion that had FFR of 0.73  She underwent PTCA/Xience stent placement to RCA  Since d/c she deneis CP  Breathing is OK  She is slowly increasing her activity   Past Medical History  Diagnosis Date  . Premature ventricular contractions   . Coronary atherosclerosis of native coronary artery     prior DES to the marginal of the LCX in January of 2012  . HLD (hyperlipidemia)   . HTN (hypertension)   . Hiatal hernia   . Hypothyroidism   . Osteoporosis   . Palpitations   . Heart murmur   . Anginal pain   . Exertional shortness of breath   . History of blood transfusion     "after daughter was born" (05/22/2013)  . Sinus headache   . Recurrent UTI     "q 4 months for the last couple years; last one was 12/2012" (05/22/2013)    Current Outpatient Prescriptions  Medication Sig Dispense Refill  . aspirin (ASPIR-81) 81 MG EC tablet Take 81 mg by mouth daily.        . Calcium Carbonate (CALTRATE 600) 1500 MG TABS Take by mouth 2 (two) times daily.       . cephALEXin (KEFLEX) 250 MG capsule Take 250 mg by mouth 4 (four) times daily. For dental procedures      . cholecalciferol (VITAMIN D) 1000 UNITS tablet Take 1,000 Units by mouth daily.      . ciprofloxacin (CIPRO) 250 MG tablet Take 250 mg by mouth 2 (two) times daily.       Marland Kitchen esomeprazole (NEXIUM) 40 MG capsule Take 40 mg by mouth daily.        Marland Kitchen levothyroxine (SYNTHROID, LEVOTHROID) 75 MCG tablet Take 75 mcg by mouth daily before breakfast.      . metoprolol tartrate (LOPRESSOR) 25 MG tablet Take 0.5 tablets (12.5 mg total) by mouth 2 (two) times daily.  30 tablet  0  . Multiple Vitamins-Minerals (MULTIVITAL) tablet Take 1 tablet by mouth daily.         . nitroGLYCERIN (NITROSTAT) 0.4 MG SL tablet Place 0.4 mg under the tongue every 5 (five) minutes as needed for chest pain.      . prasugrel (EFFIENT) 10 MG TABS tablet Take 1 tablet (10 mg total) by mouth daily.  30 tablet  3  . rosuvastatin (CRESTOR) 20 MG tablet Take 20 mg by mouth daily.      . valsartan-hydrochlorothiazide (DIOVAN HCT) 160-12.5 MG per tablet Take 1 tablet by mouth daily.        Marland Kitchen ZOSTAVAX 16109 UNT/0.65ML injection       . metoprolol tartrate (LOPRESSOR) 25 MG tablet Take 12.5 mg by mouth 2 (two) times daily.       No current facility-administered medications for this visit.    Allergies  Allergen Reactions  . Atorvastatin     REACTION: myaligia  . Codeine   . Flagyl [Metronidazole Hcl]   . Simvastatin     REACTION: myalgia  . Sulfa Antibiotics   . Metronidazole Nausea And Vomiting and Rash  . Penicillins Nausea And Vomiting and Rash  Family History  Problem Relation Age of Onset  . Heart attack Brother 40    History   Social History  . Marital Status: Married    Spouse Name: N/A    Number of Children: N/A  . Years of Education: N/A   Occupational History  . Retired Lawyer    Social History Main Topics  . Smoking status: Never Smoker   . Smokeless tobacco: Never Used  . Alcohol Use: No  . Drug Use: No  . Sexual Activity: Not Currently   Other Topics Concern  . Not on file   Social History Narrative  . No narrative on file    ROS ALL NEGATIVE EXCEPT THOSE NOTED IN HPI  PE  General Appearance: well developed, well nourished in no acute distress HEENT: symmetrical face, PERRLA  Neck: no JVD or bruits Chest: symmetric without deformity Cardiac: PMI non-displaced, RRR, normal S1, S2, no gallop or murmur Lung: clear to ausculation and percussion Abdominal: nondistended, nontender, good bowel sounds, no HSM, no bruits Extremities: no cyanosis, clubbing or edema 2+ pulses Skin: normal color, no rashes Neuro: alert and  oriented x 3, non-focal Pysch: normal affect  EKG Not repeated  BMET    Component Value Date/Time   NA 142 05/24/2013 0505   K 3.6 05/24/2013 0505   CL 108 05/24/2013 0505   CO2 23 05/24/2013 0505   GLUCOSE 86 05/24/2013 0505   BUN 8 05/24/2013 0505   CREATININE 0.69 05/24/2013 0505   CALCIUM 8.9 05/24/2013 0505   GFRNONAA 87* 05/24/2013 0505   GFRAA >90 05/24/2013 0505    Lipid Panel     Component Value Date/Time   CHOL 170 05/23/2013 0520   TRIG 227* 05/23/2013 0520   HDL 49 05/23/2013 0520   CHOLHDL 3.5 05/23/2013 0520   VLDL 45* 05/23/2013 0520   LDLCALC 76 05/23/2013 0520    CBC    Component Value Date/Time   WBC 8.7 05/24/2013 0505   RBC 3.92 05/24/2013 0505   HGB 10.9* 05/24/2013 0505   HCT 34.0* 05/24/2013 0505   PLT 217 05/24/2013 0505   MCV 86.7 05/24/2013 0505   MCH 27.8 05/24/2013 0505   MCHC 32.1 05/24/2013 0505   RDW 13.8 05/24/2013 0505   LYMPHSABS 3.3 05/22/2013 1325   MONOABS 0.7 05/22/2013 1325   EOSABS 0.3 05/22/2013 1325   BASOSABS 0.0 05/22/2013 1325    Impression:   1.  CAD  Doing well post intervention.  Keep on same regimen  She  Wants to have cataract surgery  She should not come off effient.    2.  HL  Keep on crestor  3.  Hx palpitations  Patient denies

## 2013-07-13 NOTE — Patient Instructions (Addendum)
Your physician wants you to follow-up in: 7 MONTHS WITH DR Tenny Craw You will receive a reminder letter in the mail two months in advance. If you don't receive a letter, please call our office to schedule the follow-up appointment.   Your physician recommends that you HAVE LAB WORK TODAY  TAKE CIPRO 250 MG ONE TABLET TWICE DAILY X 4 DAYS

## 2013-07-16 ENCOUNTER — Other Ambulatory Visit: Payer: Self-pay | Admitting: Internal Medicine

## 2013-07-17 ENCOUNTER — Telehealth: Payer: Self-pay | Admitting: Internal Medicine

## 2013-07-17 NOTE — Telephone Encounter (Signed)
New message    Husband has the flu.  Her pcp prescribed tamaflu for her for 10days but she has no symptoms.  She want to make sure it is ok to take this along with her other medications.

## 2013-07-17 NOTE — Telephone Encounter (Signed)
Spoke with pt, aware okay to take. 

## 2013-07-17 NOTE — Telephone Encounter (Signed)
Follow Up  Pt returned call// Pt requests a call back to confirm that it is ok to take tamaflu.. // Advised pt that a message was left on VM. Please call to confirm.

## 2013-07-17 NOTE — Telephone Encounter (Signed)
Left message for pt, okay to take

## 2013-07-31 ENCOUNTER — Telehealth (HOSPITAL_COMMUNITY): Payer: Self-pay | Admitting: Cardiac Rehabilitation

## 2013-07-31 NOTE — Telephone Encounter (Signed)
pc to enroll pt in cardiac rehab.  Pt declined due to travel distance and visual deficits.  Pt plans to exercise on her own at local gym.  Dr. Harrington Challenger aware.

## 2013-09-27 DIAGNOSIS — H2510 Age-related nuclear cataract, unspecified eye: Secondary | ICD-10-CM | POA: Diagnosis not present

## 2013-12-15 DIAGNOSIS — J019 Acute sinusitis, unspecified: Secondary | ICD-10-CM | POA: Diagnosis not present

## 2013-12-24 ENCOUNTER — Other Ambulatory Visit: Payer: Self-pay | Admitting: Internal Medicine

## 2014-01-11 DIAGNOSIS — N390 Urinary tract infection, site not specified: Secondary | ICD-10-CM | POA: Diagnosis not present

## 2014-01-11 DIAGNOSIS — R350 Frequency of micturition: Secondary | ICD-10-CM | POA: Diagnosis not present

## 2014-01-27 ENCOUNTER — Encounter (HOSPITAL_COMMUNITY): Payer: Self-pay | Admitting: Emergency Medicine

## 2014-01-27 ENCOUNTER — Emergency Department (HOSPITAL_COMMUNITY): Payer: Medicare Other

## 2014-01-27 ENCOUNTER — Other Ambulatory Visit (HOSPITAL_COMMUNITY): Payer: Self-pay | Admitting: Physician Assistant

## 2014-01-27 ENCOUNTER — Observation Stay (HOSPITAL_COMMUNITY)
Admission: EM | Admit: 2014-01-27 | Discharge: 2014-01-28 | Disposition: A | Discharge status: Home / Self Care | Payer: Medicare Other | Attending: Cardiology | Admitting: Cardiology

## 2014-01-27 DIAGNOSIS — R0602 Shortness of breath: Secondary | ICD-10-CM | POA: Diagnosis not present

## 2014-01-27 DIAGNOSIS — Z7902 Long term (current) use of antithrombotics/antiplatelets: Secondary | ICD-10-CM | POA: Insufficient documentation

## 2014-01-27 DIAGNOSIS — E039 Hypothyroidism, unspecified: Secondary | ICD-10-CM | POA: Insufficient documentation

## 2014-01-27 DIAGNOSIS — Z955 Presence of coronary angioplasty implant and graft: Secondary | ICD-10-CM

## 2014-01-27 DIAGNOSIS — I249 Acute ischemic heart disease, unspecified: Secondary | ICD-10-CM

## 2014-01-27 DIAGNOSIS — I209 Angina pectoris, unspecified: Secondary | ICD-10-CM

## 2014-01-27 DIAGNOSIS — I10 Essential (primary) hypertension: Secondary | ICD-10-CM | POA: Insufficient documentation

## 2014-01-27 DIAGNOSIS — I25119 Atherosclerotic heart disease of native coronary artery with unspecified angina pectoris: Secondary | ICD-10-CM

## 2014-01-27 DIAGNOSIS — I2510 Atherosclerotic heart disease of native coronary artery without angina pectoris: Secondary | ICD-10-CM | POA: Diagnosis not present

## 2014-01-27 DIAGNOSIS — Z8249 Family history of ischemic heart disease and other diseases of the circulatory system: Secondary | ICD-10-CM | POA: Diagnosis not present

## 2014-01-27 DIAGNOSIS — M810 Age-related osteoporosis without current pathological fracture: Secondary | ICD-10-CM | POA: Insufficient documentation

## 2014-01-27 DIAGNOSIS — Z9861 Coronary angioplasty status: Secondary | ICD-10-CM

## 2014-01-27 DIAGNOSIS — R079 Chest pain, unspecified: Secondary | ICD-10-CM | POA: Diagnosis not present

## 2014-01-27 DIAGNOSIS — D649 Anemia, unspecified: Secondary | ICD-10-CM | POA: Diagnosis not present

## 2014-01-27 DIAGNOSIS — E876 Hypokalemia: Secondary | ICD-10-CM | POA: Diagnosis not present

## 2014-01-27 DIAGNOSIS — R072 Precordial pain: Secondary | ICD-10-CM | POA: Diagnosis not present

## 2014-01-27 DIAGNOSIS — I200 Unstable angina: Secondary | ICD-10-CM | POA: Diagnosis not present

## 2014-01-27 DIAGNOSIS — E785 Hyperlipidemia, unspecified: Secondary | ICD-10-CM | POA: Diagnosis not present

## 2014-01-27 HISTORY — DX: Tremor, unspecified: R25.1

## 2014-01-27 LAB — HEPARIN LEVEL (UNFRACTIONATED): Heparin Unfractionated: 0.72 IU/mL — ABNORMAL HIGH (ref 0.30–0.70)

## 2014-01-27 LAB — CBC WITH DIFFERENTIAL/PLATELET
BASOS PCT: 0 % (ref 0–1)
Basophils Absolute: 0 10*3/uL (ref 0.0–0.1)
EOS ABS: 0.1 10*3/uL (ref 0.0–0.7)
Eosinophils Relative: 1 % (ref 0–5)
HCT: 33.9 % — ABNORMAL LOW (ref 36.0–46.0)
Hemoglobin: 10.5 g/dL — ABNORMAL LOW (ref 12.0–15.0)
Lymphocytes Relative: 17 % (ref 12–46)
Lymphs Abs: 1.6 10*3/uL (ref 0.7–4.0)
MCH: 26.2 pg (ref 26.0–34.0)
MCHC: 31 g/dL (ref 30.0–36.0)
MCV: 84.5 fL (ref 78.0–100.0)
MONO ABS: 0.4 10*3/uL (ref 0.1–1.0)
Monocytes Relative: 4 % (ref 3–12)
NEUTROS PCT: 78 % — AB (ref 43–77)
Neutro Abs: 7 10*3/uL (ref 1.7–7.7)
Platelets: 247 10*3/uL (ref 150–400)
RBC: 4.01 MIL/uL (ref 3.87–5.11)
RDW: 14.3 % (ref 11.5–15.5)
WBC: 9.2 10*3/uL (ref 4.0–10.5)

## 2014-01-27 LAB — PROTIME-INR
INR: 1.01 (ref 0.00–1.49)
Prothrombin Time: 13.3 seconds (ref 11.6–15.2)

## 2014-01-27 LAB — I-STAT TROPONIN, ED: Troponin i, poc: 0.01 ng/mL (ref 0.00–0.08)

## 2014-01-27 LAB — I-STAT CHEM 8, ED
BUN: 13 mg/dL (ref 6–23)
CALCIUM ION: 1.19 mmol/L (ref 1.13–1.30)
Chloride: 104 mEq/L (ref 96–112)
Creatinine, Ser: 0.8 mg/dL (ref 0.50–1.10)
GLUCOSE: 139 mg/dL — AB (ref 70–99)
HEMATOCRIT: 34 % — AB (ref 36.0–46.0)
HEMOGLOBIN: 11.6 g/dL — AB (ref 12.0–15.0)
POTASSIUM: 3.5 meq/L — AB (ref 3.7–5.3)
Sodium: 140 mEq/L (ref 137–147)
TCO2: 25 mmol/L (ref 0–100)

## 2014-01-27 LAB — BASIC METABOLIC PANEL
Anion gap: 13 (ref 5–15)
BUN: 14 mg/dL (ref 6–23)
CO2: 26 mEq/L (ref 19–32)
CREATININE: 0.74 mg/dL (ref 0.50–1.10)
Calcium: 9.2 mg/dL (ref 8.4–10.5)
Chloride: 105 mEq/L (ref 96–112)
GFR calc non Af Amer: 85 mL/min — ABNORMAL LOW (ref >90)
Glucose, Bld: 139 mg/dL — ABNORMAL HIGH (ref 70–99)
Potassium: 3.6 mEq/L — ABNORMAL LOW (ref 3.7–5.3)
Sodium: 144 mEq/L (ref 137–147)

## 2014-01-27 LAB — APTT: aPTT: 28 seconds (ref 24–37)

## 2014-01-27 LAB — HEMOGLOBIN A1C
Hgb A1c MFr Bld: 5.7 % — ABNORMAL HIGH (ref <5.7)
Mean Plasma Glucose: 117 mg/dL — ABNORMAL HIGH (ref <117)

## 2014-01-27 LAB — TROPONIN I: Troponin I: <0.3 ng/mL (ref <0.30)

## 2014-01-27 MED ORDER — METOPROLOL TARTRATE 12.5 MG HALF TABLET
12.5000 mg | ORAL_TABLET | Freq: Two times a day (BID) | ORAL | Status: DC
  Administered 2014-01-27: 12.5 mg via ORAL
  Filled 2014-01-27 (×3): qty 1

## 2014-01-27 MED ORDER — NITROGLYCERIN 0.4 MG SL SUBL: 0.4000 mg | SUBLINGUAL_TABLET | SUBLINGUAL | Status: DC | PRN

## 2014-01-27 MED ORDER — ONDANSETRON HCL 4 MG/2ML IJ SOLN: 4.0000 mg | Freq: Four times a day (QID) | INTRAMUSCULAR | Status: DC | PRN

## 2014-01-27 MED ORDER — HEPARIN BOLUS VIA INFUSION
4000.0000 [IU] | Freq: Once | INTRAVENOUS | Status: AC
  Administered 2014-01-27: 4000 [IU] via INTRAVENOUS
  Filled 2014-01-27: qty 4000

## 2014-01-27 MED ORDER — PANTOPRAZOLE SODIUM 40 MG PO TBEC
40.0000 mg | DELAYED_RELEASE_TABLET | Freq: Every day | ORAL | Status: DC
  Administered 2014-01-27: 40 mg via ORAL
  Filled 2014-01-27 (×2): qty 1

## 2014-01-27 MED ORDER — SODIUM CHLORIDE 0.9 % IV BOLUS (SEPSIS)
500.0000 mL | Freq: Once | INTRAVENOUS | Status: AC
  Administered 2014-01-27: 500 mL via INTRAVENOUS

## 2014-01-27 MED ORDER — SODIUM CHLORIDE 0.9 % IV SOLN: 250.0000 mL | INTRAVENOUS | Status: DC | PRN

## 2014-01-27 MED ORDER — ASPIRIN 300 MG RE SUPP: 300.0000 mg | RECTAL | Status: DC

## 2014-01-27 MED ORDER — LEVOTHYROXINE SODIUM 50 MCG PO TABS
50.0000 ug | ORAL_TABLET | Freq: Every day | ORAL | Status: DC
  Administered 2014-01-28: 50 ug via ORAL
  Filled 2014-01-27 (×2): qty 1

## 2014-01-27 MED ORDER — ONDANSETRON HCL 4 MG/2ML IJ SOLN
4.0000 mg | Freq: Once | INTRAMUSCULAR | Status: AC
  Administered 2014-01-27: 4 mg via INTRAVENOUS
  Filled 2014-01-27: qty 2

## 2014-01-27 MED ORDER — ASPIRIN 81 MG PO CHEW: 324.0000 mg | CHEWABLE_TABLET | ORAL | Status: DC

## 2014-01-27 MED ORDER — HYDROCHLOROTHIAZIDE 12.5 MG PO CAPS
12.5000 mg | ORAL_CAPSULE | Freq: Every day | ORAL | Status: DC
  Administered 2014-01-27: 12.5 mg via ORAL
  Filled 2014-01-27 (×2): qty 1

## 2014-01-27 MED ORDER — ROSUVASTATIN CALCIUM 20 MG PO TABS
20.0000 mg | ORAL_TABLET | Freq: Every day | ORAL | Status: DC
  Filled 2014-01-27: qty 1

## 2014-01-27 MED ORDER — ALPRAZOLAM 0.25 MG PO TABS
0.2500 mg | ORAL_TABLET | Freq: Two times a day (BID) | ORAL | Status: DC | PRN
  Administered 2014-01-27: 0.25 mg via ORAL
  Filled 2014-01-27 (×2): qty 1

## 2014-01-27 MED ORDER — ZOLPIDEM TARTRATE 5 MG PO TABS: 5.0000 mg | ORAL_TABLET | Freq: Every evening | ORAL | Status: DC | PRN

## 2014-01-27 MED ORDER — HEPARIN (PORCINE) IN NACL 100-0.45 UNIT/ML-% IJ SOLN
700.0000 [IU]/h | INTRAMUSCULAR | Status: DC
  Administered 2014-01-27: 700 [IU]/h via INTRAVENOUS
  Filled 2014-01-27 (×2): qty 250

## 2014-01-27 MED ORDER — MORPHINE SULFATE 2 MG/ML IJ SOLN
2.0000 mg | Freq: Once | INTRAMUSCULAR | Status: AC
  Administered 2014-01-27: 2 mg via INTRAVENOUS
  Filled 2014-01-27: qty 1

## 2014-01-27 MED ORDER — SODIUM CHLORIDE 0.9 % IJ SOLN: 3.0000 mL | INTRAMUSCULAR | Status: DC | PRN

## 2014-01-27 MED ORDER — ACETAMINOPHEN 325 MG PO TABS: 650.0000 mg | ORAL_TABLET | ORAL | Status: DC | PRN

## 2014-01-27 MED ORDER — HEPARIN (PORCINE) IN NACL 100-0.45 UNIT/ML-% IJ SOLN
750.0000 [IU]/h | INTRAMUSCULAR | Status: DC
Start: 2014-01-27 — End: 2014-01-27
  Administered 2014-01-27: 750 [IU]/h via INTRAVENOUS
  Filled 2014-01-27 (×2): qty 250

## 2014-01-27 MED ORDER — VALSARTAN-HYDROCHLOROTHIAZIDE 160-12.5 MG PO TABS: 1.0000 | ORAL_TABLET | Freq: Every day | ORAL | Status: DC

## 2014-01-27 MED ORDER — POTASSIUM CHLORIDE CRYS ER 20 MEQ PO TBCR
40.0000 meq | EXTENDED_RELEASE_TABLET | Freq: Once | ORAL | Status: AC
  Administered 2014-01-27: 40 meq via ORAL
  Filled 2014-01-27: qty 2

## 2014-01-27 MED ORDER — NON FORMULARY: 20.0000 mg | Freq: Every morning | Status: DC

## 2014-01-27 MED ORDER — ONDANSETRON HCL 4 MG/2ML IJ SOLN
4.0000 mg | Freq: Once | INTRAMUSCULAR | Status: DC
  Filled 2014-01-27: qty 2

## 2014-01-27 MED ORDER — IRBESARTAN 150 MG PO TABS
150.0000 mg | ORAL_TABLET | Freq: Every day | ORAL | Status: DC
  Administered 2014-01-27: 150 mg via ORAL
  Filled 2014-01-27 (×2): qty 1

## 2014-01-27 MED ORDER — ASPIRIN EC 81 MG PO TBEC
81.0000 mg | DELAYED_RELEASE_TABLET | Freq: Every day | ORAL | Status: DC
  Filled 2014-01-27: qty 1

## 2014-01-27 MED ORDER — SODIUM CHLORIDE 0.9 % IJ SOLN: 3.0000 mL | Freq: Two times a day (BID) | INTRAMUSCULAR | Status: DC

## 2014-01-27 MED ORDER — PRASUGREL HCL 10 MG PO TABS
10.0000 mg | ORAL_TABLET | Freq: Every day | ORAL | Status: DC
  Administered 2014-01-27 – 2014-01-28 (×2): 10 mg via ORAL
  Filled 2014-01-27 (×2): qty 1

## 2014-01-27 NOTE — ED Notes (Signed)
Attempted report 

## 2014-01-27 NOTE — H&P (Addendum)
Admit date: 01/27/2014 Primary Physician   Melinda Crutch, MD Primary Cardiologist  Dr. Dorris Carnes  CC: Chest pain  HPI: 68 year-old female with coronary artery disease, DES placement Xience to RCA on 05/23/13 with prior history of palpitations, hypertension, hyperlipidemia (LDL 52), family history of CAD with brother having MI at age 19 here via EMS secondary to chest pain, shortness of breath.  She woke this morning at approximately 2 AM and started to feel somewhat restless, anxious, felt impending doom, felt as though she could not take a deep breath but was able to go back to sleep until approximately 6 AM. At 8 AM she began to have substernal chest discomfort that felt like a pressure and did not radiate. She may have felt some mild dizziness, anorexia. She was given nitroglycerin and complained of some nausea when arriving in the emergency room. Chest pressure decreased to 2/10. Currently she refusing nitroglycerin and other pain medications. Heparin was started.   She has been under increased stress at home and her daughter notes that she has had some pressured speech, restlessness, trouble sleeping. She has been working on her aunts estate. This information was told me outside of the emergency department room by the daughter.    PMH:   Past Medical History  Diagnosis Date  . Premature ventricular contractions   . Coronary atherosclerosis of native coronary artery     prior DES to the marginal of the LCX in January of 2012  . HLD (hyperlipidemia)   . HTN (hypertension)   . Hiatal hernia   . Hypothyroidism   . Osteoporosis   . Palpitations   . Heart murmur   . Anginal pain   . Exertional shortness of breath   . History of blood transfusion     "after daughter was born" (05/22/2013)  . Sinus headache   . Recurrent UTI     "q 4 months for the last couple years; last one was 12/2012" (05/22/2013)    PSH:   Past Surgical History  Procedure Laterality Date  . Tonsillectomy      . Appendectomy    . Total abdominal hysterectomy    . Ectopic pregnancy surgery    . Breast cyst excision Right 1965  . Breast lumpectomy Right 1980's    "benign" (05/22/2013)  . Breast cyst aspiration Bilateral     "numerous in the dr's office" (05/22/2013)  . Cardiac catheterization  ~ 1968; 08/2010  . Coronary angioplasty with stent placement  07/2010  . Laparotomy  1965    "ruptured blood vessel in my side during childbirth; didn't find it til 2 days later; had to open me up to repair it" (05/22/2013)  . Coronary angioplasty with stent placement  05/23/2013    Patent LCx stent (10-20% ISR), 70% mid RCA (FFR 0.73) s/p DES, 20% prox RCA, 40% ostial D1, 60% distal posterior AV branch; EF 65-70%   Allergies:  Flagyl; Atorvastatin; Codeine; Prednisone; Simvastatin; Sulfa antibiotics; Metronidazole; and Penicillins Prior to Admit Meds:   Prior to Admission medications   Medication Sig Start Date End Date Taking? Authorizing Provider  aspirin (ASPIR-81) 81 MG EC tablet Take 81 mg by mouth daily.     Yes Historical Provider, MD  Calcium Carbonate (CALTRATE 600) 1500 MG TABS Take by mouth 2 (two) times daily.    Yes Historical Provider, MD  cephALEXin (KEFLEX) 250 MG capsule Take 250 mg by mouth 4 (four) times daily. For dental procedures 04/10/12  Yes Veryl Speak,  MD  cholecalciferol (VITAMIN D) 1000 UNITS tablet Take 1,000 Units by mouth daily.   Yes Historical Provider, MD  ciprofloxacin (CIPRO) 250 MG tablet Take 250 mg by mouth 2 (two) times daily as needed (for UTI).  07/12/13  Yes Historical Provider, MD  esomeprazole (NEXIUM) 40 MG capsule Take 40 mg by mouth daily.     Yes Historical Provider, MD  levothyroxine (SYNTHROID, LEVOTHROID) 50 MCG tablet Take 50 mcg by mouth daily before breakfast.   Yes Historical Provider, MD  metoprolol tartrate (LOPRESSOR) 25 MG tablet Take 12.5 mg by mouth 2 (two) times daily.   Yes Historical Provider, MD  metoprolol tartrate (LOPRESSOR) 25 MG tablet  TAKE 1/2 TABLET BY MOUTH TWICE A DAY 07/16/13 01/27/14 Yes Fay Records, MD  Multiple Vitamins-Minerals (MULTIVITAL) tablet Take 1 tablet by mouth daily.     Yes Historical Provider, MD  nitroGLYCERIN (NITROSTAT) 0.4 MG SL tablet Place 0.4 mg under the tongue every 5 (five) minutes as needed for chest pain.   Yes Historical Provider, MD  prasugrel (EFFIENT) 10 MG TABS tablet Take 1 tablet (10 mg total) by mouth daily. 05/24/13  Yes Roger A Arguello, PA-C  rosuvastatin (CRESTOR) 20 MG tablet Take 20 mg by mouth daily.   Yes Historical Provider, MD  rosuvastatin (CRESTOR) 20 MG tablet TAKE 1 TABLET BY MOUTH EVERY DAY  01/27/14 Yes Fay Records, MD  valsartan-hydrochlorothiazide (DIOVAN HCT) 160-12.5 MG per tablet Take 1 tablet by mouth daily.     Yes Historical Provider, MD   Fam HX:    Family History  Problem Relation Age of Onset  . Heart attack Brother 12   Social HX:    History   Social History  . Marital Status: Married    Spouse Name: N/A    Number of Children: N/A  . Years of Education: N/A   Occupational History  . Retired Oceanographer    Social History Main Topics  . Smoking status: Never Smoker   . Smokeless tobacco: Never Used  . Alcohol Use: No  . Drug Use: No  . Sexual Activity: Not Currently   Other Topics Concern  . Not on file   Social History Narrative  . No narrative on file     ROS:  Denies any fevers, chills, orthopnea, PND, rash, syncope, bleeding All 11 ROS were addressed and are negative except what is stated in the HPI   Physical Exam: Blood pressure 118/55, pulse 83, temperature 98 F (36.7 C), temperature source Oral, resp. rate 16, height 5\' 3"  (1.6 m), weight 138 lb (62.596 kg), SpO2 97.00%.   General: Well developed, well nourished, in no acute distress Head: Eyes PERRLA, No xanthomas.   Normal cephalic and atramatic  Lungs:  Clear bilaterally to auscultation and percussion. Normal respiratory effort. No wheezes, no rales. Heart:  HRRR S1  S2 Pulses are 2+ & equal. No murmurs, rubs or gallops.             Soft bilateral carotid bruits, no JVD.  No abdominal bruits. Abdomen: Bowel sounds are positive, abdomen soft and non-tender without masses or                 Hernia's noted. No hepatosplenomegaly. Msk:  Back normal, normal gait. Normal strength and tone for age. Extremities:  No clubbing, cyanosis or edema.  DP +1 Neuro: Alert and oriented X 3, non-focal, MAE x 4 GU: Deferred Rectal: Deferred Psych:  Good affect, responds appropriately, mildly anxious  Labs:   Lab Results  Component Value Date   WBC 9.2 01/27/2014   HGB 11.6* 01/27/2014   HCT 34.0* 01/27/2014   MCV 84.5 01/27/2014   PLT 247 01/27/2014    Recent Labs Lab 01/27/14 0946 01/27/14 1002  NA 144 140  K 3.6* 3.5*  CL 105 104  CO2 26  --   BUN 14 13  CREATININE 0.74 0.80  CALCIUM 9.2  --   GLUCOSE 139* 139*   No results found for this basename: CKTOTAL, CKMB, TROPONINI,  in the last 72 hours Lab Results  Component Value Date   CHOL 170 05/23/2013   HDL 49 05/23/2013   LDLCALC 76 05/23/2013   TRIG 227* 05/23/2013   Lab Results  Component Value Date   DDIMER  Value: 0.23        AT THE INHOUSE ESTABLISHED CUTOFF VALUE OF 0.48 ug/mL FEU, THIS ASSAY HAS BEEN DOCUMENTED IN THE LITERATURE TO HAVE A SENSITIVITY AND NEGATIVE PREDICTIVE VALUE OF AT LEAST 98 TO 99%.  THE TEST RESULT SHOULD BE CORRELATED WITH AN ASSESSMENT OF THE CLINICAL PROBABILITY OF DVT / VTE. 08/22/2010     Radiology:  Dg Chest Port 1 View  01/27/2014   CLINICAL DATA:  Midsternal chest pain with shortness of breath and nausea with history of coronary artery disease  EXAM: PORTABLE CHEST - 1 VIEW  COMPARISON:  PA and lateral chest of May 22, 2013  FINDINGS: The lungs are well-expanded and clear. The cardiac silhouette is mildly enlarged though stable. The pulmonary vascularity is normal. There is no pleural effusion. The mediastinum is normal in width. The bony structures are  unremarkable.  IMPRESSION: There is mild enlargement of the cardiac silhouette without evidence of pulmonary congestion or edema.   Electronically Signed   By: David  Martinique   On: 01/27/2014 09:40   Personally viewed.   EKG: Sinus rhythm with nonspecific T-wave changes/T-wave inversion in lateral leads which is new when compared to prior EKG from 05/24/13.  Personally viewed.  Cardiac catheterization 05/23/13:  Final Conclusions:  1. Significant 2 vessel coronary artery disease with widely patent stent in the proximal left circumflex. Significant mid RCA stenosis is likely the culprit for symptoms. This was significant by pressure wire interrogation with an FFR ratio of 0.73.  2. Normal LV systolic function.  3. Successful drug-eluting stent placement to the mid RCA.  Recommendations:  Continue dual antiplatelet therapy for at least 12 months.  Echocardiogram 06/07/12-ejection fraction 65%, no wall motion abnormalities   ASSESSMENT/PLAN:   68 year old female with coronary artery disease status post RCA stent placed in October of 2014 here with chest discomfort concerning for unstable angina.  1. Unstable angina-chest pain, nonspecific T-wave change, could be considered mild ischemic change in the lateral leads. Agree with IV heparin. Replete potassium, currently 3.5. Mild anemia as well at 11.6 hemoglobin. Chest x-ray unremarkable. Continue with dual antiplatelet therapy. Continue with statin. Beta blocker as tolerated. Aspirin.  I will make n.p.o. past midnight. If troponin is normal and chest pain continues to dissipate, one could consider nuclear stress test to further evaluate for ischemia. If symptoms continue or EKG worsens, we will likely proceed with cardiac catheterization. Continue to monitor.  2. CAD-drug-eluting stent to RCA 10/14. Mild to moderate disease elsewhere. See cath report.  3. Hypertension-well-controlled  4. Hyperlipidemia-continue Crestor. LDL goal 70. Last check  76.    Candee Furbish, MD  01/27/2014  11:29 AM

## 2014-01-27 NOTE — Progress Notes (Signed)
ANTICOAGULATION CONSULT NOTE - Initial Consult  Pharmacy Consult for Heparin Indication: chest pain/ACS  Allergies  Allergen Reactions  . Flagyl [Metronidazole Hcl] Anaphylaxis  . Atorvastatin     REACTION: myaligia  . Codeine Hives  . Prednisone     Makes my heart race  . Simvastatin     REACTION: myalgia  . Sulfa Antibiotics     Jittery  . Metronidazole Nausea And Vomiting and Rash  . Penicillins Nausea And Vomiting and Rash    Patient Measurements: Height: 5\' 3"  (160 cm) Weight: 140 lb 12.8 oz (63.866 kg) IBW/kg (Calculated) : 52.4 Heparin Dosing Weight: 62.6 kg  Vital Signs: Temp: 98.1 F (36.7 C) (07/05 1925) Temp src: Oral (07/05 1925) BP: 145/62 mmHg (07/05 1925) Pulse Rate: 81 (07/05 1925)  Labs:  Recent Labs  01/27/14 0946 01/27/14 1002 01/27/14 1322 01/27/14 1715  HGB 10.5* 11.6*  --   --   HCT 33.9* 34.0*  --   --   PLT 247  --   --   --   APTT 28  --   --   --   LABPROT 13.3  --   --   --   INR 1.01  --   --   --   HEPARINUNFRC  --   --   --  0.72*  CREATININE 0.74 0.80  --   --   TROPONINI  --   --  <0.30 <0.30    Estimated Creatinine Clearance: 59.7 ml/min (by C-G formula based on Cr of 0.8).   Medical History: Past Medical History  Diagnosis Date  . Premature ventricular contractions   . Coronary atherosclerosis of native coronary artery     prior DES to the marginal of the LCX in January of 2012  . HLD (hyperlipidemia)   . HTN (hypertension)   . Hiatal hernia   . Hypothyroidism   . Osteoporosis   . Palpitations   . Heart murmur   . Anginal pain   . Exertional shortness of breath   . History of blood transfusion     "after daughter was born" (05/22/2013)  . Sinus headache   . Recurrent UTI     "q 4 months for the last couple years; last one was 12/2012" (05/22/2013)  . Tremors of nervous system     by dr. love    Assessment: 6 hour heparin level is 0.72 on 700 units/hr in this 68 y.o female on heparin for r/o ACS while  awaiting further cardiology work-up. Level is slightly above goal. No bleeding noted.   Prior cardiac history including CAD with PCI Oct '14 who presented to the [facility] on 7/5 with CP. Cardiac enzymes ordered. EKG shows T-wave inversion.  Baseline CBC okay, plts 247 - the patient was on ASA/plavix PTA. No recent surgeries, bleeding, or hx CVA noted.  Goal of Therapy:  Heparin level 0.3-0.7 units/ml Monitor platelets by anticoagulation protocol: Yes   Plan:  Decrease heparin drip to rate of 700 units/hr  Will continue to monitor for any signs/symptoms of bleeding and will follow up with heparin level in AM.  Daily CBC and heparin level.   Nicole Cella, RPh Clinical Pharmacist Pager: [phone] 01/27/2014 7:29 PM

## 2014-01-27 NOTE — ED Provider Notes (Signed)
CSN: [id number]     Arrival date & time 01/27/14  0912 History   First MD Initiated Contact with Patient 01/27/14 [phone]     Chief Complaint  Patient presents with  . Chest Pain     (Consider location/radiation/quality/duration/timing/severity/associated sxs/prior Treatment) Patient is a 68 y.o. female presenting with chest pain. The history is provided by the patient.  Chest Pain Pain location:  Substernal area Pain quality: aching, pressure and tightness   Pain radiates to:  Does not radiate Pain radiates to the back: no   Pain severity:  Moderate Onset quality:  Gradual Duration:  2 hours Timing:  Constant Progression:  Partially resolved Chronicity:  Recurrent Context comment:  States was restless overnight and then some mild chest aching this morning which got progressively worse Relieved by:  Nitroglycerin and aspirin Worsened by:  Nothing tried Associated symptoms: anorexia, nausea, near-syncope and shortness of breath   Associated symptoms: no abdominal pain, no cough, no diaphoresis, not vomiting and no weakness   Risk factors: coronary artery disease, high cholesterol and hypertension   Risk factors: no smoking   Risk factors comment:  Last cath was 10/14 with stent placed   Past Medical History  Diagnosis Date  . Premature ventricular contractions   . Coronary atherosclerosis of native coronary artery     prior DES to the marginal of the LCX in January of 2012  . HLD (hyperlipidemia)   . HTN (hypertension)   . Hiatal hernia   . Hypothyroidism   . Osteoporosis   . Palpitations   . Heart murmur   . Anginal pain   . Exertional shortness of breath   . History of blood transfusion     "after daughter was born" (05/22/2013)  . Sinus headache   . Recurrent UTI     "q 4 months for the last couple years; last one was 12/2012" (05/22/2013)   Past Surgical History  Procedure Laterality Date  . Tonsillectomy    . Appendectomy    . Total abdominal hysterectomy    .  Ectopic pregnancy surgery    . Breast cyst excision Right 1965  . Breast lumpectomy Right 1980's    "benign" (05/22/2013)  . Breast cyst aspiration Bilateral     "numerous in the dr's office" (05/22/2013)  . Cardiac catheterization  ~ 1968; 08/2010  . Coronary angioplasty with stent placement  07/2010  . Laparotomy  1965    "ruptured blood vessel in my side during childbirth; didn't find it til 2 days later; had to open me up to repair it" (05/22/2013)  . Coronary angioplasty with stent placement  05/23/2013    Patent LCx stent (10-20% ISR), 70% mid RCA (FFR 0.73) s/p DES, 20% prox RCA, 40% ostial D1, 60% distal posterior AV branch; EF 65-70%   Family History  Problem Relation Age of Onset  . Heart attack Brother 40   History  Substance Use Topics  . Smoking status: Never Smoker   . Smokeless tobacco: Never Used  . Alcohol Use: No   OB History   Grav Para Term Preterm Abortions TAB SAB Ect Mult Living                 Review of Systems  Constitutional: Negative for diaphoresis.  Respiratory: Positive for shortness of breath. Negative for cough.   Cardiovascular: Positive for chest pain and near-syncope.  Gastrointestinal: Positive for nausea and anorexia. Negative for vomiting and abdominal pain.  Neurological: Negative for weakness.  All other systems  reviewed and are negative.     Allergies  Atorvastatin; Codeine; Flagyl; Simvastatin; Sulfa antibiotics; Metronidazole; and Penicillins  Home Medications   Prior to Admission medications   Medication Sig Start Date End Date Taking? Authorizing Provider  aspirin (ASPIR-81) 81 MG EC tablet Take 81 mg by mouth daily.      Historical Provider, MD  Calcium Carbonate (CALTRATE 600) 1500 MG TABS Take by mouth 2 (two) times daily.     Historical Provider, MD  cephALEXin (KEFLEX) 250 MG capsule Take 250 mg by mouth 4 (four) times daily. For dental procedures 04/10/12   Veryl Speak, MD  cholecalciferol (VITAMIN D) 1000 UNITS tablet  Take 1,000 Units by mouth daily.    Historical Provider, MD  ciprofloxacin (CIPRO) 250 MG tablet Take 250 mg by mouth 2 (two) times daily.  07/12/13   Historical Provider, MD  ciprofloxacin (CIPRO) 250 MG tablet Take 1 tablet (250 mg total) by mouth 2 (two) times daily. 07/13/13   Fay Records, MD  CRESTOR 20 MG tablet TAKE 1 TABLET BY MOUTH EVERY DAY    Fay Records, MD  esomeprazole (NEXIUM) 40 MG capsule Take 40 mg by mouth daily.      Historical Provider, MD  levothyroxine (SYNTHROID, LEVOTHROID) 75 MCG tablet Take 75 mcg by mouth daily before breakfast.    Historical Provider, MD  metoprolol tartrate (LOPRESSOR) 25 MG tablet Take 12.5 mg by mouth 2 (two) times daily. 06/02/12 06/02/13  Burtis Junes, NP  metoprolol tartrate (LOPRESSOR) 25 MG tablet TAKE 1/2 TABLET BY MOUTH TWICE A DAY 07/16/13   Fay Records, MD  Multiple Vitamins-Minerals (MULTIVITAL) tablet Take 1 tablet by mouth daily.      Historical Provider, MD  nitroGLYCERIN (NITROSTAT) 0.4 MG SL tablet Place 0.4 mg under the tongue every 5 (five) minutes as needed for chest pain.    Historical Provider, MD  prasugrel (EFFIENT) 10 MG TABS tablet Take 1 tablet (10 mg total) by mouth daily. 05/24/13   Roger A Arguello, PA-C  valsartan-hydrochlorothiazide (DIOVAN HCT) 160-12.5 MG per tablet Take 1 tablet by mouth daily.      Historical Provider, MD  ZOSTAVAX 93570 UNT/0.65ML injection  05/21/13   Historical Provider, MD   There were no vitals taken for this visit. Physical Exam  Nursing note and vitals reviewed. Constitutional: She is oriented to person, place, and time. She appears well-developed and well-nourished. No distress.  HENT:  Head: Normocephalic and atraumatic.  Mouth/Throat: Oropharynx is clear and moist.  Eyes: Conjunctivae and EOM are normal. Pupils are equal, round, and reactive to light.  Neck: Normal range of motion. Neck supple.  Cardiovascular: Normal rate, regular rhythm and intact distal pulses.   No murmur  heard. Pulmonary/Chest: Effort normal and breath sounds normal. No respiratory distress. She has no wheezes. She has no rales. She exhibits no tenderness.  Abdominal: Soft. She exhibits no distension. There is no tenderness. There is no rebound and no guarding.  Musculoskeletal: Normal range of motion. She exhibits no edema and no tenderness.  Neurological: She is alert and oriented to person, place, and time.  Skin: Skin is warm and dry. No rash noted. No erythema.  Psychiatric: She has a normal mood and affect. Her behavior is normal.    ED Course  Procedures (including critical care time) Labs Review Labs Reviewed  CBC WITH DIFFERENTIAL - Abnormal; Notable for the following:    Hemoglobin 10.5 (*)    HCT 33.9 (*)    Neutrophils  Relative % 78 (*)    All other components within normal limits  I-STAT CHEM 8, ED - Abnormal; Notable for the following:    Potassium 3.5 (*)    Glucose, Bld 139 (*)    Hemoglobin 11.6 (*)    HCT 34.0 (*)    All other components within normal limits  PROTIME-INR  APTT  BASIC METABOLIC PANEL  I-STAT TROPOININ, ED    Imaging Review Dg Chest Port 1 View  01/27/2014   CLINICAL DATA:  Midsternal chest pain with shortness of breath and nausea with history of coronary artery disease  EXAM: PORTABLE CHEST - 1 VIEW  COMPARISON:  PA and lateral chest of May 22, 2013  FINDINGS: The lungs are well-expanded and clear. The cardiac silhouette is mildly enlarged though stable. The pulmonary vascularity is normal. There is no pleural effusion. The mediastinum is normal in width. The bony structures are unremarkable.  IMPRESSION: There is mild enlargement of the cardiac silhouette without evidence of pulmonary congestion or edema.   Electronically Signed   By: David  Martinique   On: 01/27/2014 09:40     EKG Interpretation   Date/Time:  Sunday January 27 2014 09:15:35 EDT Ventricular Rate:  86 PR Interval:  158 QRS Duration: 85 QT Interval:  400 QTC Calculation: 478 R  Axis:   43 Text Interpretation:  Sinus rhythm , new T wave inversion Lateral leads  Confirmed by Maryan Rued  MD, Loree Fee ([id number]) on 01/27/2014 9:19:01 AM      MDM   Final diagnoses:  None    Pt with symptoms concerning for ACS with prior hx of CAD as well as stents last placed in october.  Heart Score  . Associated symptoms include SOB, nausea.  Low risk wells score and low suspicion for PE.  Pt denies sx suggestive of GERD. ASA and NTG given by EMS with pain improving from 8/10 to 3/10 upon arrival here.   EKG with new t-wave inversion in lat leads. CXR, CBC, BMP, trop, Coags pending.  Will give a few more NTG if blood pressure stable to get pt pain free.  10:22 AM Initial work up without acute findings.  Pt still 2/10 pain and will treat with morphine, heparin and zofran.  Discussed with cards and will admit for further care.   Blanchie Dessert, MD 01/27/14 1023

## 2014-01-27 NOTE — Progress Notes (Signed)
ANTICOAGULATION CONSULT NOTE - Initial Consult  Pharmacy Consult for Heparin Indication: chest pain/ACS  Allergies  Allergen Reactions  . Flagyl [Metronidazole Hcl] Anaphylaxis  . Atorvastatin     REACTION: myaligia  . Codeine Hives  . Prednisone     Makes my heart race  . Simvastatin     REACTION: myalgia  . Sulfa Antibiotics     Jittery  . Metronidazole Nausea And Vomiting and Rash  . Penicillins Nausea And Vomiting and Rash    Patient Measurements: Height: 5\' 3"  (160 cm) Weight: 138 lb (62.596 kg) IBW/kg (Calculated) : 52.4 Heparin Dosing Weight: 62.6 kg  Vital Signs: Temp: 98 F (36.7 C) (07/05 0926) Temp src: Oral (07/05 0926) BP: 118/55 mmHg (07/05 1035) Pulse Rate: 83 (07/05 1000)  Labs:  Recent Labs  01/27/14 0946 01/27/14 1002  HGB 10.5* 11.6*  HCT 33.9* 34.0*  PLT 247  --   APTT 28  --   LABPROT 13.3  --   INR 1.01  --   CREATININE  --  0.80    Estimated Creatinine Clearance: 54.9 ml/min (by C-G formula based on Cr of 0.8).   Medical History: Past Medical History  Diagnosis Date  . Premature ventricular contractions   . Coronary atherosclerosis of native coronary artery     prior DES to the marginal of the LCX in January of 2012  . HLD (hyperlipidemia)   . HTN (hypertension)   . Hiatal hernia   . Hypothyroidism   . Osteoporosis   . Palpitations   . Heart murmur   . Anginal pain   . Exertional shortness of breath   . History of blood transfusion     "after daughter was born" (05/22/2013)  . Sinus headache   . Recurrent UTI     "q 4 months for the last couple years; last one was 12/2012" (05/22/2013)    Assessment: 71 YOF with prior cardiac history including CAD with PCI Oct '14 who presented to the [facility] on 7/5 with CP. Cardiac enzymes ordered. EKG shows T-wave inversion. Pharmacy consulted to start heparin for r/o ACS while awaiting further cardiology work-up. Baseline CBC okay, plts 247 - the patient was on ASA/plavix PTA. No recent  surgeries, bleeding, or hx CVA noted.  Goal of Therapy:  Heparin level 0.3-0.7 units/ml Monitor platelets by anticoagulation protocol: Yes   Plan:  1. Heparin bolus of 4000 units x 1 (as ordered by the EDP - okay) 2. Initiate heparin drip at a rate of 750 units/hr (7.5 ml/hr) 3. Will continue to monitor for any signs/symptoms of bleeding and will follow up with heparin level in 6 hours   Alycia Rossetti, PharmD, BCPS Clinical Pharmacist Pager: [phone] 01/27/2014 10:41 AM

## 2014-01-27 NOTE — ED Notes (Addendum)
Pt arrived from home by [facility] with c/o cp. This morning around 0200 pt woke up and started to feel restless, unable to take a deep breath but was able to go back to sleep until 0600. Then at 0800 pt started having substernal CP that feels like pressure and does not radiate. EMS administered Nitro x3 and ASA 324mg . Currently pain is 2/10 and started to c/o nausea when arrived to ED. Initial BP-152/80 Last BP-102/64.  HR-94 O2sat-97%ra

## 2014-01-27 NOTE — ED Notes (Signed)
Pt refusing nitro or other pain medications.

## 2014-01-28 ENCOUNTER — Encounter (HOSPITAL_COMMUNITY)
Admission: EM | Disposition: A | Discharge status: Home / Self Care | Payer: Self-pay | Source: Home / Self Care | Attending: Emergency Medicine

## 2014-01-28 DIAGNOSIS — E785 Hyperlipidemia, unspecified: Secondary | ICD-10-CM | POA: Diagnosis not present

## 2014-01-28 DIAGNOSIS — I2510 Atherosclerotic heart disease of native coronary artery without angina pectoris: Secondary | ICD-10-CM | POA: Diagnosis not present

## 2014-01-28 DIAGNOSIS — I200 Unstable angina: Secondary | ICD-10-CM | POA: Diagnosis not present

## 2014-01-28 DIAGNOSIS — I10 Essential (primary) hypertension: Secondary | ICD-10-CM | POA: Diagnosis not present

## 2014-01-28 HISTORY — PX: LEFT HEART CATHETERIZATION WITH CORONARY ANGIOGRAM: SHX5451

## 2014-01-28 LAB — LIPID PANEL
CHOLESTEROL: 145 mg/dL (ref 0–200)
HDL: 51 mg/dL (ref >39)
LDL CALC: 62 mg/dL (ref 0–99)
Total CHOL/HDL Ratio: 2.8 RATIO
Triglycerides: 161 mg/dL — ABNORMAL HIGH (ref <150)
VLDL: 32 mg/dL (ref 0–40)

## 2014-01-28 LAB — CBC
HCT: 31.5 % — ABNORMAL LOW (ref 36.0–46.0)
Hemoglobin: 9.7 g/dL — ABNORMAL LOW (ref 12.0–15.0)
MCH: 26.3 pg (ref 26.0–34.0)
MCHC: 30.8 g/dL (ref 30.0–36.0)
MCV: 85.4 fL (ref 78.0–100.0)
Platelets: 235 10*3/uL (ref 150–400)
RBC: 3.69 MIL/uL — ABNORMAL LOW (ref 3.87–5.11)
RDW: 14.6 % (ref 11.5–15.5)
WBC: 8.4 10*3/uL (ref 4.0–10.5)

## 2014-01-28 LAB — BASIC METABOLIC PANEL
Anion gap: 11 (ref 5–15)
BUN: 9 mg/dL (ref 6–23)
CO2: 26 mEq/L (ref 19–32)
Calcium: 8.7 mg/dL (ref 8.4–10.5)
Chloride: 107 mEq/L (ref 96–112)
Creatinine, Ser: 0.75 mg/dL (ref 0.50–1.10)
GFR, EST NON AFRICAN AMERICAN: 84 mL/min — AB (ref >90)
Glucose, Bld: 86 mg/dL (ref 70–99)
POTASSIUM: 4.2 meq/L (ref 3.7–5.3)
SODIUM: 144 meq/L (ref 137–147)

## 2014-01-28 LAB — TROPONIN I: Troponin I: <0.3 ng/mL (ref <0.30)

## 2014-01-28 LAB — HEPARIN LEVEL (UNFRACTIONATED): HEPARIN UNFRACTIONATED: 0.57 [IU]/mL (ref 0.30–0.70)

## 2014-01-28 SURGERY — LEFT HEART CATHETERIZATION WITH CORONARY ANGIOGRAM
Anesthesia: LOCAL

## 2014-01-28 MED ORDER — SODIUM CHLORIDE 0.9 % IV SOLN: 1.0000 mL/kg/h | INTRAVENOUS | Status: DC

## 2014-01-28 MED ORDER — NITROGLYCERIN 0.2 MG/ML ON CALL CATH LAB
INTRAVENOUS | Status: AC
  Filled 2014-01-28: qty 1

## 2014-01-28 MED ORDER — SODIUM CHLORIDE 0.9 % IV SOLN: 250.0000 mL | INTRAVENOUS | Status: DC | PRN

## 2014-01-28 MED ORDER — MIDAZOLAM HCL 2 MG/2ML IJ SOLN
INTRAMUSCULAR | Status: AC
  Filled 2014-01-28: qty 2

## 2014-01-28 MED ORDER — SODIUM CHLORIDE 0.9 % IJ SOLN: 3.0000 mL | INTRAMUSCULAR | Status: DC | PRN

## 2014-01-28 MED ORDER — LIDOCAINE HCL (PF) 1 % IJ SOLN
INTRAMUSCULAR | Status: AC
  Filled 2014-01-28: qty 30

## 2014-01-28 MED ORDER — ASPIRIN 81 MG PO CHEW
81.0000 mg | CHEWABLE_TABLET | ORAL | Status: AC
  Administered 2014-01-28: 81 mg via ORAL

## 2014-01-28 MED ORDER — FENTANYL CITRATE 0.05 MG/ML IJ SOLN
INTRAMUSCULAR | Status: AC
  Filled 2014-01-28: qty 2

## 2014-01-28 MED ORDER — SODIUM CHLORIDE 0.9 % IJ SOLN: 3.0000 mL | Freq: Two times a day (BID) | INTRAMUSCULAR | Status: DC

## 2014-01-28 MED ORDER — HEPARIN (PORCINE) IN NACL 2-0.9 UNIT/ML-% IJ SOLN
INTRAMUSCULAR | Status: AC
  Filled 2014-01-28: qty 1000

## 2014-01-28 MED ORDER — SODIUM CHLORIDE 0.9 % IV SOLN
INTRAVENOUS | Status: DC
  Administered 2014-01-28: 12:00:00 via INTRAVENOUS

## 2014-01-28 MED ORDER — VERAPAMIL HCL 2.5 MG/ML IV SOLN
INTRAVENOUS | Status: AC
  Filled 2014-01-28: qty 2

## 2014-01-28 MED ORDER — HEPARIN SODIUM (PORCINE) 1000 UNIT/ML IJ SOLN
INTRAMUSCULAR | Status: AC
  Filled 2014-01-28: qty 1

## 2014-01-28 NOTE — Progress Notes (Signed)
Utilization Review Completed.Onetha Gaffey T7/12/2013  

## 2014-01-28 NOTE — Progress Notes (Signed)
ANTICOAGULATION CONSULT NOTE - Follow-up Consult  Pharmacy Consult for Heparin Indication: chest pain/ACS  Allergies  Allergen Reactions  . Flagyl [Metronidazole Hcl] Anaphylaxis  . Atorvastatin     REACTION: myaligia  . Codeine Hives  . Prednisone     Makes my heart race  . Simvastatin     REACTION: myalgia  . Sulfa Antibiotics     Jittery  . Metronidazole Nausea And Vomiting and Rash  . Penicillins Nausea And Vomiting and Rash    Patient Measurements: Height: 5\' 3"  (160 cm) Weight: 137 lb 11.2 oz (62.46 kg) IBW/kg (Calculated) : 52.4 Heparin Dosing Weight: 62.6 kg  Vital Signs: Temp: 97.9 F (36.6 C) (07/06 0422) Temp src: Oral (07/06 0422) BP: 119/50 mmHg (07/06 0422) Pulse Rate: 71 (07/06 0422)  Labs:  Recent Labs  01/27/14 0946 01/27/14 1002 01/27/14 1322 01/27/14 1715 01/28/14 0040 01/28/14 0323  HGB 10.5* 11.6*  --   --   --  9.7*  HCT 33.9* 34.0*  --   --   --  31.5*  PLT 247  --   --   --   --  235  APTT 28  --   --   --   --   --   LABPROT 13.3  --   --   --   --   --   INR 1.01  --   --   --   --   --   HEPARINUNFRC  --   --   --  0.72*  --  0.57  CREATININE 0.74 0.80  --   --   --  0.75  TROPONINI  --   --  <0.30 <0.30 <0.30  --     Estimated Creatinine Clearance: 54.9 ml/min (by C-G formula based on Cr of 0.75).   Medical History: Past Medical History  Diagnosis Date  . Premature ventricular contractions   . Coronary atherosclerosis of native coronary artery     prior DES to the marginal of the LCX in January of 2012  . HLD (hyperlipidemia)   . HTN (hypertension)   . Hiatal hernia   . Hypothyroidism   . Osteoporosis   . Palpitations   . Heart murmur   . Anginal pain   . Exertional shortness of breath   . History of blood transfusion     "after daughter was born" (05/22/2013)  . Sinus headache   . Recurrent UTI     "q 4 months for the last couple years; last one was 12/2012" (05/22/2013)  . Tremors of nervous system     by dr.  love    Assessment: The heparin level is now therapeutic after a slight rate reduction overnight.  Plt remain WNL, but Hgb has dropped to 9.7.  Hgb drop possibly dilutional, continue to monitor.  No bleeding noted.    Goal of Therapy:  Heparin level 0.3-0.7 units/ml Monitor platelets by anticoagulation protocol: Yes   Plan:  Continue heparin drip at 700 units/hr  Will continue to monitor for any signs/symptoms of bleeding  Daily CBC and heparin level.   Thank you, Vivia Ewing, PharmD Clinical Pharmacist - Resident Pager: [phone] Pharmacy: [phone] 01/28/2014 8:59 AM

## 2014-01-28 NOTE — Progress Notes (Signed)
Patient: [name] / Admit Date: 01/27/2014 / Date of Encounter: 01/28/2014, 7:22 AM   Subjective: No further CP overnight. Took a Xanax which really helped her sleep. Chest pressure felt like similar to 2012 stenting situation. Also reports episodes of fatigue at grocery store twice in the last 2 weeks. Hgb 10.5 on adm -> 9.7 this AM. (down to 10.9 in 04/2013). Received NS bolus in ED. Denies any bleeding including blood in stool, urine, NSAID use or prior history of significant bleeding.  Objective: Telemetry: NSR, brief PACs in succession overnight  Physical Exam: Blood pressure 119/50, pulse 71, temperature 97.9 F (36.6 C), temperature source Oral, resp. rate 18, height 5\' 3"  (1.6 m), weight 137 lb 11.2 oz (62.46 kg), SpO2 97.00%. General: Well developed, well nourished WF in no acute distress. Head: Normocephalic, atraumatic, sclera non-icteric, no xanthomas, nares are without discharge. Neck: Soft R carotid bruit. JVP not elevated. Lungs: Clear bilaterally to auscultation without wheezes, rales, or rhonchi. Breathing is unlabored. Heart: RRR S1 S2 without murmurs, rubs, or gallops.  Abdomen: Soft, non-tender, non-distended with normoactive bowel sounds. No rebound/guarding. Extremities: No clubbing or cyanosis. No edema. Distal pedal pulses are 2+ and equal bilaterally. Neuro: Alert and oriented X 3. Moves all extremities spontaneously. Psych:  Responds to questions appropriately with a normal affect.   Intake/Output Summary (Last 24 hours) at 01/28/14 8416 Last data filed at 01/28/14 6063  Gross per 24 hour  Intake  68.37 ml  Output   1775 ml  Net -1706.63 ml    Inpatient Medications:  . aspirin  324 mg Oral NOW   Or  . aspirin  300 mg Rectal NOW  . aspirin EC  81 mg Oral Daily  . irbesartan  150 mg Oral Daily   And  . hydrochlorothiazide  12.5 mg Oral Daily  . levothyroxine  50 mcg Oral QAC breakfast  . metoprolol tartrate  12.5 mg Oral BID  . ondansetron  4 mg  Intravenous Once  . pantoprazole  40 mg Oral Daily  . prasugrel  10 mg Oral Daily  . rosuvastatin  20 mg Oral q1800  . sodium chloride  3 mL Intravenous Q12H   Infusions:  . heparin 700 Units/hr (01/27/14 1959)    Labs:  Recent Labs  01/27/14 0946 01/27/14 1002 01/28/14 0323  NA 144 140 144  K 3.6* 3.5* 4.2  CL 105 104 107  CO2 26  --  26  GLUCOSE 139* 139* 86  BUN 14 13 9   CREATININE 0.74 0.80 0.75  CALCIUM 9.2  --  8.7     Recent Labs  01/27/14 0946 01/27/14 1002 01/28/14 0323  WBC 9.2  --  8.4  NEUTROABS 7.0  --   --   HGB 10.5* 11.6* 9.7*  HCT 33.9* 34.0* 31.5*  MCV 84.5  --  85.4  PLT 247  --  235    Recent Labs  01/27/14 1322 01/27/14 1715 01/28/14 0040  TROPONINI <0.30 <0.30 <0.30   No components found with this basename: POCBNP,   Recent Labs  01/27/14 1322  HGBA1C 5.7*     Radiology/Studies:  Dg Chest Port 1 View  01/27/2014   CLINICAL DATA:  Midsternal chest pain with shortness of breath and nausea with history of coronary artery disease  EXAM: PORTABLE CHEST - 1 VIEW  COMPARISON:  PA and lateral chest of May 22, 2013  FINDINGS: The lungs are well-expanded and clear. The cardiac silhouette is mildly enlarged though stable. The pulmonary  vascularity is normal. There is no pleural effusion. The mediastinum is normal in width. The bony structures are unremarkable.  IMPRESSION: There is mild enlargement of the cardiac silhouette without evidence of pulmonary congestion or edema.   Electronically Signed   By: David  Martinique   On: 01/27/2014 09:40     Assessment and Plan  1. Chest pain/restlessness 2. CAD s/p DES to OM 2012, DES to RCA 04/2013, mild-mod disease elsewhere, normal LV function 3. Increased social stress with physical manifestations of anxiety (pressured speech, restlessness, insomnia) 4. Normocytic anemia 5. HTN, controlled 6. Hyperlipidemia, on statin 7. Soft R carotid bruit, can schedule outpatient dopplers  She has ruled  out. EKG yesterday showed nonspecific ST-T changes, EKG this morning is normal. Will discuss further evaluation with MD - ? Cath vs stress test. Would favor inpatient eval given severity of sx. No known bleeding noted - will need to follow Hgb. Received NS bolus in ED so could be partly dilutional.  Signed, Melina Copa PA-C   68 year old female with known CAD, PCI/DES to OM and RCA (2012, 2014) who presented with unstable angina. We will continue heparin, irbesartan, metoprolol, rosuvastatin. She has been on dual antiplatelet therapy with aspirin and prasugrel. Cath today.    [name] 01/28/2014

## 2014-01-28 NOTE — CV Procedure (Signed)
    Cardiac Catheterization Procedure Note  Name: [name] MRN: [id number] DOB: December 31, 1945  Procedure: Left Heart Cath, Selective Coronary Angiography, LV angiography  Indication: Chest pain concerning for USAP   Procedural Details: Initially, the right wrist was evaluated for radial access. The distal radial pulse was palpable, but proximal to the radial styloid, the artery was not palpable. I used ultrasound and was unable to confirm pulsatile flow in the radial artery. I suspect the vessel was occluded. Attention was then turned to the left wrist. Ultrasound guided access was utilized. The left wrist was prepped, draped, and anesthetized with 1% lidocaine. Using the modified Seldinger technique, a 5 French sheath was introduced into the right radial artery under direct ultrasound visualization. 3 mg of verapamil was administered through the sheath, weight-based unfractionated heparin was administered intravenously. Standard Judkins catheters were used for selective coronary angiography and left ventriculography. Catheter exchanges were performed over an exchange length guidewire. There were no immediate procedural complications. A TR band was used for radial hemostasis at the completion of the procedure.  The patient was transferred to the post catheterization recovery area for further monitoring.  Procedural Findings: Hemodynamics: AO 119/55 LV 118/9  Coronary angiography: Coronary dominance: right  Left mainstem: The left mainstem is patent with no significant obstruction.  Left anterior descending (LAD): The LAD is patent to the distal anterior wall. There is mild calcification with mild nonobstructive plaque noted in the proximal and mid vessel. The diagonal branches are patent.  Left circumflex (LCx): The left circumflex is patent. The proximal circumflex was stented into the first obtuse marginal branch with very mild in-stent restenosis of no greater than 20%. There is no  high-grade stenosis throughout the left circumflex distribution.  Right coronary artery (RCA): The RCA is dominant. The stented segment in the mid vessel is widely patent with no significant in-stent restenosis. The acute marginal branch has no obstructive disease. The distal RCA has no significant disease. There is mild 50% stenosis involving the posterior AV segment. The PLA branch is patent.  Left ventriculography: Left ventricular systolic function is normal, LVEF is estimated at 55-65%, there is no significant mitral regurgitation.  There is heavy mitral annular calcification present.  Final Conclusions:   1. 2 vessel coronary artery disease with continued patency of the stented segments in the left circumflex and right coronary arteries 2. Minor nonobstructive LAD stenosis 3. Normal LV systolic function  Recommendations: Suspect noncardiac chest pain. Will discharge home later this evening.  Sherren Mocha 01/28/2014, 4:59 PM

## 2014-01-28 NOTE — Discharge Summary (Signed)
CARDIOLOGY DISCHARGE SUMMARY   Patient ID: [name] MRN: [id number] DOB/AGE: [birth year] 70 y.o.  Admit date: 01/27/2014 Discharge date: 01/30/2014  PCP:  Melinda Crutch, MD Primary Cardiologist: Dr. Harrington Challenger  Primary Discharge Diagnosis:  Intermediate coronary syndrome - medical therapy for CAD  Secondary Discharge Diagnosis:    HYPERLIPIDEMIA   HYPERTENSION   CAD, NATIVE VESSEL   S/P primary angioplasty with coronary stent   Hypokalemia   Anemia  Procedures: cardiac catheterization, coronary arteriogram, left ventriculogram  Hospital Course: [name] is a 68 y.o. female with a history of CAD. She had chest pain and SOB, and came to the ER, where she was admitted for further evaluation and treatment.  Her cardiac enzymes were negative for MI. Her SOB improved and her chest Xray had no acute abnormality. Her potassium was slightly low on admission and was supplemented. She was mildly anemic on admission and this was followed. Her MCV is normal and this can be followed as an outpatient.   Her symptoms were concerning for angina, so a cardiac catheterization was performed on 07/06. The results are below, medical therapy was recommended for non-obstructive CAD.  After the heart catheterization, on 07/06, [name] was ambulating without chest pain or SOB and her radial site was without ecchymosis or hematoma. She is considered stable for discharge, to follow up as an outpatient.  Labs:   Lab Results  Component Value Date   WBC 8.4 01/28/2014   HGB 9.7* 01/28/2014   HCT 31.5* 01/28/2014   MCV 85.4 01/28/2014   PLT 235 01/28/2014     Recent Labs Lab 01/28/14 0323  NA 144  K 4.2  CL 107  CO2 26  BUN 9  CREATININE 0.75  CALCIUM 8.7  GLUCOSE 86    Recent Labs  01/27/14 1715 01/28/14 0040  TROPONINI <0.30 <0.30   Lipid Panel     Component Value Date/Time   CHOL 145 01/28/2014 0323   TRIG 161* 01/28/2014 0323   HDL 51 01/28/2014 0323   CHOLHDL 2.8 01/28/2014 0323   VLDL 32 01/28/2014 0323   LDLCALC 62 01/28/2014 0323    Pro B Natriuretic peptide (BNP)  Date/Time Value Ref Range Status  05/22/2013  1:25 PM 202.9* 0 - 125 pg/mL Final  08/23/2010  2:03 AM 118.0* 0.0 - 100.0 pg/mL Final     Radiology: Dg Chest Port 1 View 01/27/2014   CLINICAL DATA:  Midsternal chest pain with shortness of breath and nausea with history of coronary artery disease  EXAM: PORTABLE CHEST - 1 VIEW  COMPARISON:  PA and lateral chest of May 22, 2013  FINDINGS: The lungs are well-expanded and clear. The cardiac silhouette is mildly enlarged though stable. The pulmonary vascularity is normal. There is no pleural effusion. The mediastinum is normal in width. The bony structures are unremarkable.  IMPRESSION: There is mild enlargement of the cardiac silhouette without evidence of pulmonary congestion or edema.   Electronically Signed   By: David  Martinique   On: 01/27/2014 09:40   Cardiac Cath: 01/28/2014 Coronary angiography:  Coronary dominance: right  Left mainstem: The left mainstem is patent with no significant obstruction.  Left anterior descending (LAD): The LAD is patent to the distal anterior wall. There is mild calcification with mild nonobstructive plaque noted in the proximal and mid vessel. The diagonal branches are patent.  Left circumflex (LCx): The left circumflex is patent. The proximal circumflex was stented into the first obtuse marginal branch with very  mild in-stent restenosis of no greater than 20%. There is no high-grade stenosis throughout the left circumflex distribution.  Right coronary artery (RCA): The RCA is dominant. The stented segment in the mid vessel is widely patent with no significant in-stent restenosis. The acute marginal branch has no obstructive disease. The distal RCA has no significant disease. There is mild 50% stenosis involving the posterior AV segment. The PLA branch is patent.  Left ventriculography: Left ventricular systolic function is normal,  LVEF is estimated at 55-65%, there is no significant mitral regurgitation. There is heavy mitral annular calcification present.  Final Conclusions:  1. 2 vessel coronary artery disease with continued patency of the stented segments in the left circumflex and right coronary arteries  2. Minor nonobstructive LAD stenosis  3. Normal LV systolic function  Recommendations: Suspect noncardiac chest pain. Will discharge home later this evening.  EKG: 01/28/2014 SR, no acute ischemic changes Vent. rate 68 BPM PR interval 172 ms QRS duration 78 ms QT/QTc 428/455 ms P-R-T axes 34 16 49  FOLLOW UP PLANS AND APPOINTMENTS Allergies  Allergen Reactions  . Flagyl [Metronidazole Hcl] Anaphylaxis  . Atorvastatin     REACTION: myaligia  . Codeine Hives  . Prednisone     Makes my heart race  . Simvastatin     REACTION: myalgia  . Sulfa Antibiotics     Jittery  . Metronidazole Nausea And Vomiting and Rash  . Penicillins Nausea And Vomiting and Rash     Medication List         ASPIR-81 81 MG EC tablet  Generic drug:  aspirin  Take 81 mg by mouth daily.     CALTRATE 600 1500 MG Tabs  Generic drug:  Calcium Carbonate  Take by mouth 2 (two) times daily.     cephALEXin 250 MG capsule  Commonly known as:  KEFLEX  Take 250 mg by mouth 4 (four) times daily. For dental procedures     cholecalciferol 1000 UNITS tablet  Commonly known as:  VITAMIN D  Take 1,000 Units by mouth daily.     ciprofloxacin 250 MG tablet  Commonly known as:  CIPRO  Take 250 mg by mouth 2 (two) times daily as needed (for UTI).     DIOVAN HCT 160-12.5 MG per tablet  Generic drug:  valsartan-hydrochlorothiazide  Take 1 tablet by mouth daily.     levothyroxine 50 MCG tablet  Commonly known as:  SYNTHROID, LEVOTHROID  Take 50 mcg by mouth daily before breakfast.     metoprolol tartrate 25 MG tablet  Commonly known as:  LOPRESSOR  Take 12.5 mg by mouth 2 (two) times daily.     MULTIVITAL tablet  Take 1 tablet  by mouth daily.     NEXIUM 40 MG capsule  Generic drug:  esomeprazole  Take 40 mg by mouth daily.     nitroGLYCERIN 0.4 MG SL tablet  Commonly known as:  NITROSTAT  Place 0.4 mg under the tongue every 5 (five) minutes as needed for chest pain.     rosuvastatin 20 MG tablet  Commonly known as:  CRESTOR  Take 20 mg by mouth daily.        Discharge Instructions   Diet - low sodium heart healthy    Complete by:  As directed      Increase activity slowly    Complete by:  As directed           Follow-up Information   Follow up with Dorris Carnes, MD.  Specialty:  Cardiology   Contact information:   [place] [id number] [phone]       BRING ALL MEDICATIONS WITH YOU TO FOLLOW UP APPOINTMENTS  Time spent with patient to include physician time: 38 min Signed: Rosaria Ferries, PA-C 01/30/2014, 1:50 PM Co-Sign MD

## 2014-01-28 NOTE — Interval H&P Note (Signed)
History and Physical Interval Note:  01/28/2014 3:44 PM  [name]  has presented today for surgery, with the diagnosis of cp  The various methods of treatment have been discussed with the patient and family. After consideration of risks, benefits and other options for treatment, the patient has consented to  Procedure(s): LEFT HEART CATHETERIZATION WITH CORONARY ANGIOGRAM (N/A) as a surgical intervention .  The patient's history has been reviewed, patient examined, no change in status, stable for surgery.  I have reviewed the patient's chart and labs.  Questions were answered to the patient's satisfaction.    Cath Lab Visit (complete for each Cath Lab visit)  Clinical Evaluation Leading to the Procedure:   ACS: Yes.    Non-ACS:    Anginal Classification: CCS IV  Anti-ischemic medical therapy: Minimal Therapy (1 class of medications)  Non-Invasive Test Results: No non-invasive testing performed  Prior CABG: No previous CABG       [name]

## 2014-01-28 NOTE — H&P (View-Only) (Signed)
Patient: [name] / Admit Date: 01/27/2014 / Date of Encounter: 01/28/2014, 7:22 AM   Subjective: No further CP overnight. Took a Xanax which really helped her sleep. Chest pressure felt like similar to 2012 stenting situation. Also reports episodes of fatigue at grocery store twice in the last 2 weeks. Hgb 10.5 on adm -> 9.7 this AM. (down to 10.9 in 04/2013). Received NS bolus in ED. Denies any bleeding including blood in stool, urine, NSAID use or prior history of significant bleeding.  Objective: Telemetry: NSR, brief PACs in succession overnight  Physical Exam: Blood pressure 119/50, pulse 71, temperature 97.9 F (36.6 C), temperature source Oral, resp. rate 18, height 5\' 3"  (1.6 m), weight 137 lb 11.2 oz (62.46 kg), SpO2 97.00%. General: Well developed, well nourished WF in no acute distress. Head: Normocephalic, atraumatic, sclera non-icteric, no xanthomas, nares are without discharge. Neck: Soft R carotid bruit. JVP not elevated. Lungs: Clear bilaterally to auscultation without wheezes, rales, or rhonchi. Breathing is unlabored. Heart: RRR S1 S2 without murmurs, rubs, or gallops.  Abdomen: Soft, non-tender, non-distended with normoactive bowel sounds. No rebound/guarding. Extremities: No clubbing or cyanosis. No edema. Distal pedal pulses are 2+ and equal bilaterally. Neuro: Alert and oriented X 3. Moves all extremities spontaneously. Psych:  Responds to questions appropriately with a normal affect.   Intake/Output Summary (Last 24 hours) at 01/28/14 1610 Last data filed at 01/28/14 9604  Gross per 24 hour  Intake  68.37 ml  Output   1775 ml  Net -1706.63 ml    Inpatient Medications:  . aspirin  324 mg Oral NOW   Or  . aspirin  300 mg Rectal NOW  . aspirin EC  81 mg Oral Daily  . irbesartan  150 mg Oral Daily   And  . hydrochlorothiazide  12.5 mg Oral Daily  . levothyroxine  50 mcg Oral QAC breakfast  . metoprolol tartrate  12.5 mg Oral BID  . ondansetron  4 mg  Intravenous Once  . pantoprazole  40 mg Oral Daily  . prasugrel  10 mg Oral Daily  . rosuvastatin  20 mg Oral q1800  . sodium chloride  3 mL Intravenous Q12H   Infusions:  . heparin 700 Units/hr (01/27/14 1959)    Labs:  Recent Labs  01/27/14 0946 01/27/14 1002 01/28/14 0323  NA 144 140 144  K 3.6* 3.5* 4.2  CL 105 104 107  CO2 26  --  26  GLUCOSE 139* 139* 86  BUN 14 13 9   CREATININE 0.74 0.80 0.75  CALCIUM 9.2  --  8.7     Recent Labs  01/27/14 0946 01/27/14 1002 01/28/14 0323  WBC 9.2  --  8.4  NEUTROABS 7.0  --   --   HGB 10.5* 11.6* 9.7*  HCT 33.9* 34.0* 31.5*  MCV 84.5  --  85.4  PLT 247  --  235    Recent Labs  01/27/14 1322 01/27/14 1715 01/28/14 0040  TROPONINI <0.30 <0.30 <0.30   No components found with this basename: POCBNP,   Recent Labs  01/27/14 1322  HGBA1C 5.7*     Radiology/Studies:  Dg Chest Port 1 View  01/27/2014   CLINICAL DATA:  Midsternal chest pain with shortness of breath and nausea with history of coronary artery disease  EXAM: PORTABLE CHEST - 1 VIEW  COMPARISON:  PA and lateral chest of May 22, 2013  FINDINGS: The lungs are well-expanded and clear. The cardiac silhouette is mildly enlarged though stable. The pulmonary  vascularity is normal. There is no pleural effusion. The mediastinum is normal in width. The bony structures are unremarkable.  IMPRESSION: There is mild enlargement of the cardiac silhouette without evidence of pulmonary congestion or edema.   Electronically Signed   By: David  Martinique   On: 01/27/2014 09:40     Assessment and Plan  1. Chest pain/restlessness 2. CAD s/p DES to OM 2012, DES to RCA 04/2013, mild-mod disease elsewhere, normal LV function 3. Increased social stress with physical manifestations of anxiety (pressured speech, restlessness, insomnia) 4. Normocytic anemia 5. HTN, controlled 6. Hyperlipidemia, on statin 7. Soft R carotid bruit, can schedule outpatient dopplers  She has ruled  out. EKG yesterday showed nonspecific ST-T changes, EKG this morning is normal. Will discuss further evaluation with MD - ? Cath vs stress test. Would favor inpatient eval given severity of sx. No known bleeding noted - will need to follow Hgb. Received NS bolus in ED so could be partly dilutional.  Signed, Melina Copa PA-C   68 year old female with known CAD, PCI/DES to OM and RCA (2012, 2014) who presented with unstable angina. We will continue heparin, irbesartan, metoprolol, rosuvastatin. She has been on dual antiplatelet therapy with aspirin and prasugrel. Cath today.    [name] 01/28/2014

## 2014-01-28 NOTE — Discharge Instructions (Signed)
PLEASE REMEMBER TO BRING ALL OF YOUR MEDICATIONS TO EACH OF YOUR FOLLOW-UP OFFICE VISITS. ° °PLEASE ATTEND ALL SCHEDULED FOLLOW-UP APPOINTMENTS.  ° °Activity: Increase activity slowly as tolerated. You may shower, but no soaking baths (or swimming) for 1 week. No driving for 2 days. No lifting over 5 lbs for 1 week. No sexual activity for 1 week.  ° °You May Return to Work: in 1 week (if applicable) ° °Wound Care: You may wash cath site gently with soap and water. Keep cath site clean and dry. If you notice pain, swelling, bleeding or pus at your cath site, please call [phone]. ° ° ° °Cardiac Cath Site Care °Refer to this sheet in the next few weeks. These instructions provide you with information on caring for yourself after your procedure. Your caregiver may also give you more specific instructions. Your treatment has been planned according to current medical practices, but problems sometimes occur. Call your caregiver if you have any problems or questions after your procedure. °HOME CARE INSTRUCTIONS °· You may shower 24 hours after the procedure. Remove the bandage (dressing) and gently wash the site with plain soap and water. Gently pat the site dry.  °· Do not apply powder or lotion to the site.  °· Do not sit in a bathtub, swimming pool, or whirlpool for 5 to 7 days.  °· No bending, squatting, or lifting anything over 10 pounds (4.5 kg) as directed by your caregiver.  °· Inspect the site at least twice daily.  °· Do not drive home if you are discharged the same day of the procedure. Have someone else drive you.  °· You may drive 24 hours after the procedure unless otherwise instructed by your caregiver.  °What to expect: °· Any bruising will usually fade within 1 to 2 weeks.  °· Blood that collects in the tissue (hematoma) may be painful to the touch. It should usually decrease in size and tenderness within 1 to 2 weeks.  °SEEK IMMEDIATE MEDICAL CARE IF: °· You have unusual pain at the site or down the  affected limb.  °· You have redness, warmth, swelling, or pain at the site.  °· You have drainage (other than a small amount of blood on the dressing).  °· You have chills.  °· You have a fever or persistent symptoms for more than 72 hours.  °· You have a fever and your symptoms suddenly get worse.  °· Your leg becomes pale, cool, tingly, or numb.  °· You have heavy bleeding from the site. Hold pressure on the site.  °Document Released: 08/14/2010 Document Revised: 07/01/2011 Document Reviewed: 08/14/2010 °ExitCare® Patient Information ©2012 [employer]. ° °

## 2014-01-28 NOTE — Progress Notes (Signed)
received pt for cath lab at 1700 with  t-r band inflated to 17cc removed 3cc at 15- 20 min interval checked pulses and completely removed band at 1820 discharged pt at 1930 per orders                                                                                                                       Discharged to home with family office visits in place teaching done

## 2014-01-29 ENCOUNTER — Telehealth: Payer: Self-pay | Admitting: Internal Medicine

## 2014-01-29 NOTE — Telephone Encounter (Signed)
Patient had heart cath 7/6. Has appointment with Dr. Harrington Challenger on 8/3. She is seeing PCP this week. Wants to make sure Dr. Harrington Challenger doesn't need to see her sooner than 8/3. Also, she states Dr. Meda Coffee told her her carotid artery needs checked but that it can be done as outpatient. Wants to know if Dr. Harrington Challenger would like it checked prior to her appointment.

## 2014-01-29 NOTE — Telephone Encounter (Signed)
New message          Pt would like to know if her appt for August is too long to wait after having a heart cath

## 2014-01-30 NOTE — Telephone Encounter (Signed)
Patient's cath showed no significnat CAD Nothing from heart to cause symptoms I would keep follow up for 8/3  I think that is OK

## 2014-01-31 DIAGNOSIS — F411 Generalized anxiety disorder: Secondary | ICD-10-CM | POA: Diagnosis not present

## 2014-01-31 DIAGNOSIS — D649 Anemia, unspecified: Secondary | ICD-10-CM | POA: Diagnosis not present

## 2014-01-31 DIAGNOSIS — E876 Hypokalemia: Secondary | ICD-10-CM | POA: Diagnosis not present

## 2014-01-31 DIAGNOSIS — R079 Chest pain, unspecified: Secondary | ICD-10-CM | POA: Diagnosis not present

## 2014-01-31 NOTE — Telephone Encounter (Signed)
lmtcb

## 2014-01-31 NOTE — Telephone Encounter (Signed)
Patient informed. 

## 2014-02-12 ENCOUNTER — Emergency Department (HOSPITAL_BASED_OUTPATIENT_CLINIC_OR_DEPARTMENT_OTHER)
Admission: EM | Admit: 2014-02-12 | Discharge: 2014-02-12 | Disposition: A | Discharge status: Home / Self Care | Payer: Medicare Other | Attending: Emergency Medicine | Admitting: Emergency Medicine

## 2014-02-12 ENCOUNTER — Encounter (HOSPITAL_BASED_OUTPATIENT_CLINIC_OR_DEPARTMENT_OTHER): Payer: Self-pay | Admitting: Emergency Medicine

## 2014-02-12 ENCOUNTER — Emergency Department (HOSPITAL_BASED_OUTPATIENT_CLINIC_OR_DEPARTMENT_OTHER): Payer: Medicare Other

## 2014-02-12 DIAGNOSIS — Z792 Long term (current) use of antibiotics: Secondary | ICD-10-CM | POA: Insufficient documentation

## 2014-02-12 DIAGNOSIS — R0789 Other chest pain: Secondary | ICD-10-CM | POA: Diagnosis not present

## 2014-02-12 DIAGNOSIS — Z7982 Long term (current) use of aspirin: Secondary | ICD-10-CM | POA: Diagnosis not present

## 2014-02-12 DIAGNOSIS — Z87440 Personal history of urinary (tract) infections: Secondary | ICD-10-CM | POA: Diagnosis not present

## 2014-02-12 DIAGNOSIS — Z9861 Coronary angioplasty status: Secondary | ICD-10-CM | POA: Insufficient documentation

## 2014-02-12 DIAGNOSIS — Z8739 Personal history of other diseases of the musculoskeletal system and connective tissue: Secondary | ICD-10-CM | POA: Diagnosis not present

## 2014-02-12 DIAGNOSIS — R011 Cardiac murmur, unspecified: Secondary | ICD-10-CM | POA: Diagnosis not present

## 2014-02-12 DIAGNOSIS — R072 Precordial pain: Secondary | ICD-10-CM | POA: Diagnosis not present

## 2014-02-12 DIAGNOSIS — R079 Chest pain, unspecified: Secondary | ICD-10-CM | POA: Diagnosis not present

## 2014-02-12 DIAGNOSIS — Z79899 Other long term (current) drug therapy: Secondary | ICD-10-CM | POA: Diagnosis not present

## 2014-02-12 DIAGNOSIS — I10 Essential (primary) hypertension: Secondary | ICD-10-CM | POA: Insufficient documentation

## 2014-02-12 DIAGNOSIS — Z8719 Personal history of other diseases of the digestive system: Secondary | ICD-10-CM | POA: Diagnosis not present

## 2014-02-12 DIAGNOSIS — E785 Hyperlipidemia, unspecified: Secondary | ICD-10-CM | POA: Diagnosis not present

## 2014-02-12 DIAGNOSIS — Z880 Allergy status to penicillin: Secondary | ICD-10-CM | POA: Insufficient documentation

## 2014-02-12 DIAGNOSIS — Z9889 Other specified postprocedural states: Secondary | ICD-10-CM | POA: Diagnosis not present

## 2014-02-12 DIAGNOSIS — I2510 Atherosclerotic heart disease of native coronary artery without angina pectoris: Secondary | ICD-10-CM | POA: Diagnosis not present

## 2014-02-12 DIAGNOSIS — I209 Angina pectoris, unspecified: Secondary | ICD-10-CM | POA: Insufficient documentation

## 2014-02-12 DIAGNOSIS — E039 Hypothyroidism, unspecified: Secondary | ICD-10-CM | POA: Diagnosis not present

## 2014-02-12 LAB — CBC WITH DIFFERENTIAL/PLATELET
BASOS PCT: 0 % (ref 0–1)
Basophils Absolute: 0 10*3/uL (ref 0.0–0.1)
EOS ABS: 0.1 10*3/uL (ref 0.0–0.7)
Eosinophils Relative: 1 % (ref 0–5)
HCT: 35.9 % — ABNORMAL LOW (ref 36.0–46.0)
HEMOGLOBIN: 11.4 g/dL — AB (ref 12.0–15.0)
Lymphocytes Relative: 16 % (ref 12–46)
Lymphs Abs: 1.8 10*3/uL (ref 0.7–4.0)
MCH: 27.3 pg (ref 26.0–34.0)
MCHC: 31.8 g/dL (ref 30.0–36.0)
MCV: 86.1 fL (ref 78.0–100.0)
MONOS PCT: 5 % (ref 3–12)
Monocytes Absolute: 0.5 10*3/uL (ref 0.1–1.0)
Neutro Abs: 8.5 10*3/uL — ABNORMAL HIGH (ref 1.7–7.7)
Neutrophils Relative %: 78 % — ABNORMAL HIGH (ref 43–77)
PLATELETS: 283 10*3/uL (ref 150–400)
RBC: 4.17 MIL/uL (ref 3.87–5.11)
RDW: 14.3 % (ref 11.5–15.5)
WBC: 10.9 10*3/uL — ABNORMAL HIGH (ref 4.0–10.5)

## 2014-02-12 LAB — COMPREHENSIVE METABOLIC PANEL
ALBUMIN: 4 g/dL (ref 3.5–5.2)
ALK PHOS: 86 U/L (ref 39–117)
ALT: 21 U/L (ref 0–35)
AST: 26 U/L (ref 0–37)
Anion gap: 12 (ref 5–15)
BUN: 13 mg/dL (ref 6–23)
CALCIUM: 9.5 mg/dL (ref 8.4–10.5)
CO2: 29 mEq/L (ref 19–32)
Chloride: 99 mEq/L (ref 96–112)
Creatinine, Ser: 0.8 mg/dL (ref 0.50–1.10)
GFR calc non Af Amer: 74 mL/min — ABNORMAL LOW (ref >90)
GFR, EST AFRICAN AMERICAN: 85 mL/min — AB (ref >90)
GLUCOSE: 157 mg/dL — AB (ref 70–99)
POTASSIUM: 3.6 meq/L — AB (ref 3.7–5.3)
Sodium: 140 mEq/L (ref 137–147)
Total Bilirubin: 0.9 mg/dL (ref 0.3–1.2)
Total Protein: 7.1 g/dL (ref 6.0–8.3)

## 2014-02-12 LAB — TROPONIN I: Troponin I: <0.3 ng/mL (ref <0.30)

## 2014-02-12 LAB — URINE MICROSCOPIC-ADD ON

## 2014-02-12 LAB — URINALYSIS, ROUTINE W REFLEX MICROSCOPIC
Bilirubin Urine: NEGATIVE
GLUCOSE, UA: NEGATIVE mg/dL
Ketones, ur: NEGATIVE mg/dL
Nitrite: NEGATIVE
Protein, ur: NEGATIVE mg/dL
SPECIFIC GRAVITY, URINE: 1.007 (ref 1.005–1.030)
Urobilinogen, UA: 0.2 mg/dL (ref 0.0–1.0)
pH: 6 (ref 5.0–8.0)

## 2014-02-12 NOTE — ED Provider Notes (Signed)
CSN: [id number]     Arrival date & time 02/12/14  1337 History   First MD Initiated Contact with Patient 02/12/14 1347     Chief Complaint  Patient presents with  . Chest Pain     (Consider location/radiation/quality/duration/timing/severity/associated sxs/prior Treatment) HPI Comments: Patient is a 68 year old female with history of coronary artery disease with stent x2. She presents with complaints of tightness in her chest is been occurring intermittently for the past several weeks. She had a heart cath done at [facility] 2 weeks ago which did not reveal any significant stenoses and at the stents appear to be open. She was discharged with noncardiac chest discomfort. She states that she just does not "feel quite right".  Patient is a 68 y.o. female presenting with chest pain. The history is provided by the patient.  Chest Pain Pain location:  Substernal area Pain quality: tightness   Pain radiates to:  Does not radiate Pain radiates to the back: no   Pain severity:  Mild Duration:  3 weeks Timing:  Intermittent Chronicity:  New   Past Medical History  Diagnosis Date  . Premature ventricular contractions   . Coronary atherosclerosis of native coronary artery     prior DES to the marginal of the LCX in January of 2012  . HLD (hyperlipidemia)   . HTN (hypertension)   . Hiatal hernia   . Hypothyroidism   . Osteoporosis   . Palpitations   . Heart murmur   . Anginal pain   . Exertional shortness of breath   . History of blood transfusion     "after daughter was born" (05/22/2013)  . Sinus headache   . Recurrent UTI     "q 4 months for the last couple years; last one was 12/2012" (05/22/2013)  . Tremors of nervous system     by dr. love   Past Surgical History  Procedure Laterality Date  . Tonsillectomy    . Appendectomy    . Total abdominal hysterectomy    . Ectopic pregnancy surgery    . Breast cyst excision Right 1965  . Breast lumpectomy Right 1980's    "benign"  (05/22/2013)  . Breast cyst aspiration Bilateral     "numerous in the dr's office" (05/22/2013)  . Cardiac catheterization  ~ 1968; 08/2010  . Coronary angioplasty with stent placement  07/2010  . Laparotomy  1965    "ruptured blood vessel in my side during childbirth; didn't find it til 2 days later; had to open me up to repair it" (05/22/2013)  . Coronary angioplasty with stent placement  05/23/2013    Patent LCx stent (10-20% ISR), 70% mid RCA (FFR 0.73) s/p DES, 20% prox RCA, 40% ostial D1, 60% distal posterior AV branch; EF 65-70%   Family History  Problem Relation Age of Onset  . Heart attack Brother 40   History  Substance Use Topics  . Smoking status: Never Smoker   . Smokeless tobacco: Never Used  . Alcohol Use: No   OB History   Grav Para Term Preterm Abortions TAB SAB Ect Mult Living                 Review of Systems  Cardiovascular: Positive for chest pain.  All other systems reviewed and are negative.     Allergies  Flagyl; Atorvastatin; Codeine; Prednisone; Simvastatin; Sulfa antibiotics; Metronidazole; and Penicillins  Home Medications   Prior to Admission medications   Medication Sig Start Date End Date Taking? Authorizing Provider  aspirin (ASPIR-81) 81 MG EC tablet Take 81 mg by mouth daily.      Historical Provider, MD  Calcium Carbonate (CALTRATE 600) 1500 MG TABS Take by mouth 2 (two) times daily.     Historical Provider, MD  cephALEXin (KEFLEX) 250 MG capsule Take 250 mg by mouth 4 (four) times daily. For dental procedures 04/10/12   Veryl Speak, MD  cholecalciferol (VITAMIN D) 1000 UNITS tablet Take 1,000 Units by mouth daily.    Historical Provider, MD  ciprofloxacin (CIPRO) 250 MG tablet Take 250 mg by mouth 2 (two) times daily as needed (for UTI).  07/12/13   Historical Provider, MD  EFFIENT 10 MG TABS tablet TAKE 1 TABLET BY MOUTH EVERY DAY    Roger A Arguello, PA-C  esomeprazole (NEXIUM) 40 MG capsule Take 40 mg by mouth daily.      Historical  Provider, MD  levothyroxine (SYNTHROID, LEVOTHROID) 50 MCG tablet Take 50 mcg by mouth daily before breakfast.    Historical Provider, MD  metoprolol tartrate (LOPRESSOR) 25 MG tablet Take 12.5 mg by mouth 2 (two) times daily.    Historical Provider, MD  Multiple Vitamins-Minerals (MULTIVITAL) tablet Take 1 tablet by mouth daily.      Historical Provider, MD  nitroGLYCERIN (NITROSTAT) 0.4 MG SL tablet Place 0.4 mg under the tongue every 5 (five) minutes as needed for chest pain.    Historical Provider, MD  rosuvastatin (CRESTOR) 20 MG tablet Take 20 mg by mouth daily.    Historical Provider, MD  valsartan-hydrochlorothiazide (DIOVAN HCT) 160-12.5 MG per tablet Take 1 tablet by mouth daily.      Historical Provider, MD   BP 162/69  Pulse 79  Temp(Src) 98.8 F (37.1 C) (Oral)  Resp 18  Wt 137 lb (62.143 kg)  SpO2 100% Physical Exam  Nursing note and vitals reviewed. Constitutional: She is oriented to person, place, and time. She appears well-developed and well-nourished. No distress.  HENT:  Head: Normocephalic and atraumatic.  Neck: Normal range of motion. Neck supple.  Cardiovascular: Normal rate and regular rhythm.  Exam reveals no gallop and no friction rub.   No murmur heard. Pulmonary/Chest: Effort normal and breath sounds normal. No respiratory distress. She has no wheezes.  Abdominal: Soft. Bowel sounds are normal. She exhibits no distension. There is no tenderness.  Musculoskeletal: Normal range of motion.  Neurological: She is alert and oriented to person, place, and time.  Skin: Skin is warm and dry. She is not diaphoretic.    ED Course  Procedures (including critical care time) Labs Review Labs Reviewed  URINE CULTURE  CBC WITH DIFFERENTIAL  COMPREHENSIVE METABOLIC PANEL  TROPONIN I    Imaging Review No results found.   Date: 02/12/2014  Rate: 79  Rhythm: normal sinus rhythm  QRS Axis: normal  Intervals: normal  ST/T Wave abnormalities: normal  Conduction  Disutrbances:none  Narrative Interpretation:   Old EKG Reviewed: none available    MDM   Final diagnoses:  None    Patient presents with complaints of weakness and feeling jittery for several days. She felt tight in the chest this morning. EKG shows no changes in troponin is negative. I strongly doubt a cardiac etiology as she just had a heart cath 2 weeks ago that showed no significant lesions. Her electrolytes and blood counts reveal a slight anemia, however this is consistent with her baseline. Her sugar is mildly elevated but has been in the past and I do not feel is the etiology. I have  found no emergent pathology to explain her symptoms and believe her to be stable for discharge. She is to followup with her primary doctor towards the end of the week and return to the ER if her symptoms substantially worsen or change.    Veryl Speak, MD 02/12/14 [phone]

## 2014-02-12 NOTE — ED Notes (Signed)
Pt. Reports feeling like she has energy at times and then feeling very tired and jittery.  Pt. Seems anxious in triage and reports she has no nerve problems.

## 2014-02-12 NOTE — ED Notes (Signed)
Pt requested a Coke to take her Nexium that she brought with her. Ok per Dr. Stark Jock.

## 2014-02-12 NOTE — Discharge Instructions (Signed)

## 2014-02-13 ENCOUNTER — Encounter: Payer: Self-pay | Admitting: Internal Medicine

## 2014-02-16 ENCOUNTER — Other Ambulatory Visit: Payer: Self-pay | Admitting: Internal Medicine

## 2014-02-16 LAB — URINE CULTURE: Colony Count: 50000

## 2014-02-17 ENCOUNTER — Telehealth (HOSPITAL_BASED_OUTPATIENT_CLINIC_OR_DEPARTMENT_OTHER): Payer: Self-pay | Admitting: Emergency Medicine

## 2014-02-17 NOTE — Telephone Encounter (Signed)
Patient notified of +urine and new Rx. Wants Rx called to [facility] at [facility] [phone]). Asked for Rx to be changed to Keflex 250 mg PO QID x 7 days. Checked with Pharmacist and change was okayed.

## 2014-02-17 NOTE — Telephone Encounter (Signed)
Post ED Visit - Positive Culture Follow-up: Successful Patient Follow-Up  Culture assessed and recommendations reviewed by: []  Wes Walnut, Pharm.D., BCPS []  Heide Guile, Pharm.D., BCPS []  Alycia Rossetti, Pharm.D., BCPS []  [place], Pharm.D., BCPS, AAHIVP [x]  Legrand Como, Pharm.D., BCPS, AAHIVP  Positive urine culture  [x]  Patient discharged without antimicrobial prescription and treatment is now indicated []  Organism is resistant to prescribed ED discharge antimicrobial []  Patient with positive blood cultures  Changes discussed with ED provider: Cleatrice Burke PA-C New antibiotic prescription: Keflex 500 mg PO TID x 7 days    Kandice Schmelter 02/17/2014, 2:27 PM

## 2014-02-17 NOTE — Progress Notes (Signed)
ED Antimicrobial Stewardship Positive Culture Follow Up   [name] is an 68 y.o. female who presented to [facility] on 02/12/2014 with a chief complaint of  Chief Complaint  Patient presents with  . Chest Pain    Recent Results (from the past 720 hour(s))  URINE CULTURE     Status: None   Collection Time    02/12/14  1:52 PM      Result Value Ref Range Status   Specimen Description URINE, CLEAN CATCH   Final   Special Requests NONE   Final   Culture  Setup Time     Final   Value: 02/13/2014 02:27     Performed at [place]     Final   Value: 50,000 COLONIES/ML     Performed at [facility]   Culture     Final   Value: KLEBSIELLA PNEUMONIAE     ESCHERICHIA COLI     Performed at [facility]   Report Status 02/16/2014 FINAL   Final   Organism ID, Bacteria KLEBSIELLA PNEUMONIAE   Final   Organism ID, Bacteria ESCHERICHIA COLI   Final    [x]  Patient discharged originally without antimicrobial agent and treatment is now indicated  New antibiotic prescription: Keflex 500mg  PO TID x 7 days  ED Provider: Cleatrice Burke, PA-C  [name] 02/17/2014, 11:28 AM Infectious Diseases Pharmacist Phone# [phone]

## 2014-02-25 ENCOUNTER — Ambulatory Visit (INDEPENDENT_AMBULATORY_CARE_PROVIDER_SITE_OTHER): Payer: Medicare Other | Admitting: Internal Medicine

## 2014-02-25 VITALS — BP 153/78 | HR 71 | Ht 63.0 in | Wt 135.0 lb

## 2014-02-25 DIAGNOSIS — I200 Unstable angina: Secondary | ICD-10-CM | POA: Diagnosis not present

## 2014-02-25 DIAGNOSIS — I2510 Atherosclerotic heart disease of native coronary artery without angina pectoris: Secondary | ICD-10-CM | POA: Diagnosis not present

## 2014-02-25 MED ORDER — ROSUVASTATIN CALCIUM 20 MG PO TABS
20.0000 mg | ORAL_TABLET | Freq: Every day | ORAL | Status: DC
Start: 2014-02-25 — End: 2014-08-02

## 2014-02-25 MED ORDER — PRASUGREL HCL 10 MG PO TABS: 10.0000 mg | ORAL_TABLET | Freq: Every day | ORAL | Status: DC

## 2014-02-25 MED ORDER — METOPROLOL TARTRATE 25 MG PO TABS: 12.5000 mg | ORAL_TABLET | Freq: Two times a day (BID) | ORAL | Status: DC

## 2014-02-25 NOTE — Progress Notes (Addendum)
HPI  Patient is a 68 yo with a history of CAD and palpitations.  She was previously followed by [facility]  I saw her last year She was admitted to [facility] in October with unstable angina.  Cath showed Mild dz of LAD, patient stent to LCx with 10 to 20% instent restenosis.  The RCA had a 60 to 70% mid lesion that had FFR of 0.73  She underwent PTCA/Xience stent placement to RCA [facility] was admitted in July with CP  Underewent cardiac cath  This showed Left anterior descending (LAD): The LAD is patent to the distal anterior wall. There is mild calcification with mild nonobstructive plaque noted in the proximal and mid vessel. Left circumflex (LCx):  patent. The proximal circumflex was stented into the first obtuse marginal branch with very mild in-stent restenosis of no greater than 20%. Right coronary artery (RCA): The RCA is dominant. The stented segment in the mid vessel is widely patent with no significant in-stent restenosis.  There is mild 50% stenosis involving the posterior AV segment. Left ventriculography: Left ventricular systolic function is normal, LVEF is estimated at 55-65%, there is no significant mitral regurgitation. There is heavy mitral annular calcification present.   Since then she was back in the ER with Chest pressure  Different from what she had in October.  Generalized tightness  Felt bad all over Weak  Later in week told had UTI  Rx ABX  Since then feeling better.  No Cp  Or tightness  Breathing is OK    Since then has been doing some walking  Seen by primary MD  Hgb down slightly  Placed on MVI with Fe   Past Medical History  Diagnosis Date  . Premature ventricular contractions   . Coronary atherosclerosis of native coronary artery     prior DES to the marginal of the LCX in January of 2012  . HLD (hyperlipidemia)   . HTN (hypertension)   . Hiatal hernia   . Hypothyroidism   . Osteoporosis   . Palpitations   . Heart murmur   . Anginal pain   . Exertional shortness  of breath   . History of blood transfusion     "after daughter was born" (05/22/2013)  . Sinus headache   . Recurrent UTI     "q 4 months for the last couple years; last one was 12/2012" (05/22/2013)  . Tremors of nervous system     by dr. love    Current Outpatient Prescriptions  Medication Sig Dispense Refill  . aspirin (ASPIR-81) 81 MG EC tablet Take 81 mg by mouth daily.        . Calcium Carbonate (CALTRATE 600) 1500 MG TABS Take by mouth 2 (two) times daily.       . cephALEXin (KEFLEX) 250 MG capsule Take 250 mg by mouth 4 (four) times daily. For dental procedures      . cholecalciferol (VITAMIN D) 1000 UNITS tablet Take 1,000 Units by mouth daily.      . ciprofloxacin (CIPRO) 250 MG tablet Take 250 mg by mouth 2 (two) times daily as needed (for UTI).       Marland Kitchen EFFIENT 10 MG TABS tablet TAKE 1 TABLET BY MOUTH EVERY DAY  30 tablet  3  . esomeprazole (NEXIUM) 40 MG capsule Take 40 mg by mouth daily.        Marland Kitchen levothyroxine (SYNTHROID, LEVOTHROID) 50 MCG tablet Take 50 mcg by mouth daily before breakfast.      .  metoprolol tartrate (LOPRESSOR) 25 MG tablet Take 12.5 mg by mouth 2 (two) times daily.      . Multiple Vitamins-Minerals (MULTIVITAL) tablet Take 1 tablet by mouth daily.        . nitroGLYCERIN (NITROSTAT) 0.4 MG SL tablet Place 0.4 mg under the tongue every 5 (five) minutes as needed for chest pain.      . rosuvastatin (CRESTOR) 20 MG tablet Take 20 mg by mouth daily.      . valsartan-hydrochlorothiazide (DIOVAN HCT) 160-12.5 MG per tablet Take 1 tablet by mouth daily.        Marland Kitchen ALPRAZolam (XANAX) 0.25 MG tablet As needed to help with sleep       No current facility-administered medications for this visit.    Allergies  Allergen Reactions  . Flagyl [Metronidazole Hcl] Anaphylaxis  . Atorvastatin     REACTION: myaligia  . Codeine Hives  . Prednisone     Makes my heart race  . Simvastatin     REACTION: myalgia  . Sulfa Antibiotics     Jittery  . Metronidazole Nausea And  Vomiting and Rash  . Penicillins Nausea And Vomiting and Rash    Family History  Problem Relation Age of Onset  . Heart attack Brother 40    History   Social History  . Marital Status: Married    Spouse Name: N/A    Number of Children: N/A  . Years of Education: N/A   Occupational History  . Retired Oceanographer    Social History Main Topics  . Smoking status: Never Smoker   . Smokeless tobacco: Never Used  . Alcohol Use: No  . Drug Use: No  . Sexual Activity: Not Currently   Other Topics Concern  . Not on file   Social History Narrative  . No narrative on file    ROS ALL NEGATIVE EXCEPT THOSE NOTED IN HPI  PE Patient is in NAD BP 153/78  P 71  Wt 135 General Appearance: well developed, well nourished in no acute distress HEENT: symmetrical face, PERRLA  Neck: no JVD or bruits Chest: symmetric without deformity Cardiac: PMI non-displaced, RRR, normal S1, S2, no gallop or murmur Lung: clear to ausculation and percussion Abdominal: nondistended, nontender, good bowel sounds, no HSM, no bruits Extremities: no cyanosis, clubbing or edema 2+ pulses Skin: normal color, no rashes Neuro: alert and oriented x 3, non-focal Pysch: normal affect    BMET    Component Value Date/Time   NA 140 02/12/2014 1430   K 3.6* 02/12/2014 1430   CL 99 02/12/2014 1430   CO2 29 02/12/2014 1430   GLUCOSE 157* 02/12/2014 1430   BUN 13 02/12/2014 1430   CREATININE 0.80 02/12/2014 1430   CALCIUM 9.5 02/12/2014 1430   GFRNONAA 74* 02/12/2014 1430   GFRAA 85* 02/12/2014 1430    Lipid Panel     Component Value Date/Time   CHOL 145 01/28/2014 0323   TRIG 161* 01/28/2014 0323   HDL 51 01/28/2014 0323   CHOLHDL 2.8 01/28/2014 0323   VLDL 32 01/28/2014 0323   LDLCALC 62 01/28/2014 0323    CBC    Component Value Date/Time   WBC 10.9* 02/12/2014 1430   RBC 4.17 02/12/2014 1430   HGB 11.4* 02/12/2014 1430   HCT 35.9* 02/12/2014 1430   PLT 283 02/12/2014 1430   MCV 86.1 02/12/2014 1430   MCH  27.3 02/12/2014 1430   MCHC 31.8 02/12/2014 1430   RDW 14.3 02/12/2014 1430   LYMPHSABS  1.8 02/12/2014 1430   MONOABS 0.5 02/12/2014 1430   EOSABS 0.1 02/12/2014 1430   BASOSABS 0.0 02/12/2014 1430    Impression:   1.  CAD  Doing well now  Has had another episode of not feeling well  This also was vague.  Not like pain prior to stent  Continuee medical R Review intervention to determine long term antplt Rx.   2.  HL  Keep on crestor  Lipids good  3.  Anemia  Minimal drop in Hgb  Follow    Addendum (9/3) Patinet had DES to RCA in 04/2013.  Keep on current regimen.

## 2014-02-25 NOTE — Patient Instructions (Signed)
Your physician recommends that you continue on your current medications as directed. Please refer to the Current Medication list given to you today.  Your physician wants you to follow-up in: end of February with Dr. Harrington Challenger.  You will receive a reminder letter in the mail two months in advance. If you don't receive a letter, please call our office to schedule the follow-up appointment.

## 2014-03-15 DIAGNOSIS — N390 Urinary tract infection, site not specified: Secondary | ICD-10-CM | POA: Diagnosis not present

## 2014-03-19 DIAGNOSIS — N390 Urinary tract infection, site not specified: Secondary | ICD-10-CM | POA: Diagnosis not present

## 2014-03-19 DIAGNOSIS — Z23 Encounter for immunization: Secondary | ICD-10-CM | POA: Diagnosis not present

## 2014-03-19 DIAGNOSIS — H6980 Other specified disorders of Eustachian tube, unspecified ear: Secondary | ICD-10-CM | POA: Diagnosis not present

## 2014-04-03 DIAGNOSIS — R3989 Other symptoms and signs involving the genitourinary system: Secondary | ICD-10-CM | POA: Diagnosis not present

## 2014-04-03 DIAGNOSIS — J309 Allergic rhinitis, unspecified: Secondary | ICD-10-CM | POA: Diagnosis not present

## 2014-05-10 ENCOUNTER — Other Ambulatory Visit: Payer: Self-pay

## 2014-07-04 ENCOUNTER — Encounter (HOSPITAL_COMMUNITY): Payer: Self-pay | Admitting: Cardiovascular Disease

## 2014-07-06 ENCOUNTER — Emergency Department (HOSPITAL_BASED_OUTPATIENT_CLINIC_OR_DEPARTMENT_OTHER): Payer: Medicare Other

## 2014-07-06 ENCOUNTER — Encounter (HOSPITAL_BASED_OUTPATIENT_CLINIC_OR_DEPARTMENT_OTHER): Payer: Self-pay

## 2014-07-06 ENCOUNTER — Emergency Department (HOSPITAL_BASED_OUTPATIENT_CLINIC_OR_DEPARTMENT_OTHER)
Admission: EM | Admit: 2014-07-06 | Discharge: 2014-07-06 | Disposition: A | Discharge status: Home / Self Care | Payer: Medicare Other | Attending: Emergency Medicine | Admitting: Emergency Medicine

## 2014-07-06 DIAGNOSIS — R011 Cardiac murmur, unspecified: Secondary | ICD-10-CM | POA: Diagnosis not present

## 2014-07-06 DIAGNOSIS — R110 Nausea: Secondary | ICD-10-CM | POA: Diagnosis not present

## 2014-07-06 DIAGNOSIS — Z7902 Long term (current) use of antithrombotics/antiplatelets: Secondary | ICD-10-CM | POA: Insufficient documentation

## 2014-07-06 DIAGNOSIS — Z955 Presence of coronary angioplasty implant and graft: Secondary | ICD-10-CM | POA: Diagnosis not present

## 2014-07-06 DIAGNOSIS — R1011 Right upper quadrant pain: Secondary | ICD-10-CM | POA: Diagnosis not present

## 2014-07-06 DIAGNOSIS — E785 Hyperlipidemia, unspecified: Secondary | ICD-10-CM | POA: Diagnosis not present

## 2014-07-06 DIAGNOSIS — Z8719 Personal history of other diseases of the digestive system: Secondary | ICD-10-CM | POA: Insufficient documentation

## 2014-07-06 DIAGNOSIS — Z87440 Personal history of urinary (tract) infections: Secondary | ICD-10-CM | POA: Diagnosis not present

## 2014-07-06 DIAGNOSIS — N2889 Other specified disorders of kidney and ureter: Secondary | ICD-10-CM | POA: Diagnosis not present

## 2014-07-06 DIAGNOSIS — R109 Unspecified abdominal pain: Secondary | ICD-10-CM | POA: Diagnosis not present

## 2014-07-06 DIAGNOSIS — Z792 Long term (current) use of antibiotics: Secondary | ICD-10-CM | POA: Insufficient documentation

## 2014-07-06 DIAGNOSIS — I10 Essential (primary) hypertension: Secondary | ICD-10-CM | POA: Diagnosis not present

## 2014-07-06 DIAGNOSIS — Z79899 Other long term (current) drug therapy: Secondary | ICD-10-CM | POA: Insufficient documentation

## 2014-07-06 DIAGNOSIS — I25119 Atherosclerotic heart disease of native coronary artery with unspecified angina pectoris: Secondary | ICD-10-CM | POA: Insufficient documentation

## 2014-07-06 DIAGNOSIS — K7689 Other specified diseases of liver: Secondary | ICD-10-CM | POA: Diagnosis not present

## 2014-07-06 DIAGNOSIS — Z7982 Long term (current) use of aspirin: Secondary | ICD-10-CM | POA: Diagnosis not present

## 2014-07-06 DIAGNOSIS — M810 Age-related osteoporosis without current pathological fracture: Secondary | ICD-10-CM | POA: Diagnosis not present

## 2014-07-06 DIAGNOSIS — Z8669 Personal history of other diseases of the nervous system and sense organs: Secondary | ICD-10-CM | POA: Diagnosis not present

## 2014-07-06 DIAGNOSIS — Z9889 Other specified postprocedural states: Secondary | ICD-10-CM | POA: Insufficient documentation

## 2014-07-06 DIAGNOSIS — E039 Hypothyroidism, unspecified: Secondary | ICD-10-CM | POA: Insufficient documentation

## 2014-07-06 LAB — CBC WITH DIFFERENTIAL/PLATELET
BASOS PCT: 0 % (ref 0–1)
Basophils Absolute: 0 10*3/uL (ref 0.0–0.1)
EOS ABS: 0.1 10*3/uL (ref 0.0–0.7)
Eosinophils Relative: 2 % (ref 0–5)
HEMATOCRIT: 41.7 % (ref 36.0–46.0)
HEMOGLOBIN: 13.9 g/dL (ref 12.0–15.0)
Lymphocytes Relative: 19 % (ref 12–46)
Lymphs Abs: 1.7 10*3/uL (ref 0.7–4.0)
MCH: 30.2 pg (ref 26.0–34.0)
MCHC: 33.3 g/dL (ref 30.0–36.0)
MCV: 90.5 fL (ref 78.0–100.0)
MONO ABS: 0.4 10*3/uL (ref 0.1–1.0)
MONOS PCT: 5 % (ref 3–12)
Neutro Abs: 6.6 10*3/uL (ref 1.7–7.7)
Neutrophils Relative %: 74 % (ref 43–77)
Platelets: 246 10*3/uL (ref 150–400)
RBC: 4.61 MIL/uL (ref 3.87–5.11)
RDW: 13.2 % (ref 11.5–15.5)
WBC: 8.9 10*3/uL (ref 4.0–10.5)

## 2014-07-06 LAB — COMPREHENSIVE METABOLIC PANEL
ALBUMIN: 4.1 g/dL (ref 3.5–5.2)
ALT: 23 U/L (ref 0–35)
ANION GAP: 14 (ref 5–15)
AST: 31 U/L (ref 0–37)
Alkaline Phosphatase: 92 U/L (ref 39–117)
BUN: 12 mg/dL (ref 6–23)
CALCIUM: 9.5 mg/dL (ref 8.4–10.5)
CO2: 27 mEq/L (ref 19–32)
CREATININE: 0.8 mg/dL (ref 0.50–1.10)
Chloride: 102 mEq/L (ref 96–112)
GFR calc Af Amer: 85 mL/min — ABNORMAL LOW (ref >90)
GFR calc non Af Amer: 73 mL/min — ABNORMAL LOW (ref >90)
Glucose, Bld: 104 mg/dL — ABNORMAL HIGH (ref 70–99)
Potassium: 3.9 mEq/L (ref 3.7–5.3)
Sodium: 143 mEq/L (ref 137–147)
Total Bilirubin: 1 mg/dL (ref 0.3–1.2)
Total Protein: 7.5 g/dL (ref 6.0–8.3)

## 2014-07-06 LAB — LIPASE, BLOOD: LIPASE: 37 U/L (ref 11–59)

## 2014-07-06 LAB — URINALYSIS, ROUTINE W REFLEX MICROSCOPIC
Bilirubin Urine: NEGATIVE
GLUCOSE, UA: NEGATIVE mg/dL
KETONES UR: 15 mg/dL — AB
NITRITE: NEGATIVE
PROTEIN: NEGATIVE mg/dL
Specific Gravity, Urine: 1.017 (ref 1.005–1.030)
Urobilinogen, UA: 0.2 mg/dL (ref 0.0–1.0)
pH: 6 (ref 5.0–8.0)

## 2014-07-06 LAB — URINE MICROSCOPIC-ADD ON

## 2014-07-06 MED ORDER — ONDANSETRON 4 MG PO TBDP: 4.0000 mg | ORAL_TABLET | Freq: Three times a day (TID) | ORAL | Status: DC | PRN

## 2014-07-06 MED ORDER — ONDANSETRON HCL 4 MG/2ML IJ SOLN
4.0000 mg | Freq: Once | INTRAMUSCULAR | Status: AC
  Administered 2014-07-06: 4 mg via INTRAVENOUS
  Filled 2014-07-06: qty 2

## 2014-07-06 MED ORDER — SUCRALFATE 1 G PO TABS
1.0000 g | ORAL_TABLET | Freq: Two times a day (BID) | ORAL | Status: DC
Start: 2014-07-06 — End: 2014-08-26

## 2014-07-06 MED ORDER — SODIUM CHLORIDE 0.9 % IV BOLUS (SEPSIS)
500.0000 mL | Freq: Once | INTRAVENOUS | Status: AC
  Administered 2014-07-06: 500 mL via INTRAVENOUS

## 2014-07-06 NOTE — ED Provider Notes (Signed)
CSN: [id number]     Arrival date & time 07/06/14  0903 History   First MD Initiated Contact with Patient 07/06/14 [phone]     Chief Complaint  Patient presents with  . right sided pain       HPI His present evaluation of right upper quadrant abdominal pain. She states is been intermittent for the last 4-5 days. She states Thursday night it came on after some carrots yesterday after some broccoli. No history of known biliary disease. History of gastritis for which she takes Nexium. History of coronary artery disease status post PTCA with stents. No chest pain or shortness of breath. 's on iron for a "mild anemia" starting 4 months ago. Takes aspirin daily.  Past Medical History  Diagnosis Date  . Premature ventricular contractions   . Coronary atherosclerosis of native coronary artery     prior DES to the marginal of the LCX in January of 2012  . HLD (hyperlipidemia)   . HTN (hypertension)   . Hiatal hernia   . Hypothyroidism   . Osteoporosis   . Palpitations   . Heart murmur   . Anginal pain   . Exertional shortness of breath   . History of blood transfusion     "after daughter was born" (05/22/2013)  . Sinus headache   . Recurrent UTI     "q 4 months for the last couple years; last one was 12/2012" (05/22/2013)  . Tremors of nervous system     by dr. love   Past Surgical History  Procedure Laterality Date  . Tonsillectomy    . Appendectomy    . Total abdominal hysterectomy    . Ectopic pregnancy surgery    . Breast cyst excision Right 1965  . Breast lumpectomy Right 1980's    "benign" (05/22/2013)  . Breast cyst aspiration Bilateral     "numerous in the dr's office" (05/22/2013)  . Cardiac catheterization  ~ 1968; 08/2010  . Coronary angioplasty with stent placement  07/2010  . Laparotomy  1965    "ruptured blood vessel in my side during childbirth; didn't find it til 2 days later; had to open me up to repair it" (05/22/2013)  . Coronary angioplasty with stent placement   05/23/2013    Patent LCx stent (10-20% ISR), 70% mid RCA (FFR 0.73) s/p DES, 20% prox RCA, 40% ostial D1, 60% distal posterior AV branch; EF 65-70%  . Left heart catheterization with coronary angiogram N/A 05/23/2013    Procedure: LEFT HEART CATHETERIZATION WITH CORONARY ANGIOGRAM;  Surgeon: Wellington Hampshire, MD;  Location: [facility] CATH [facility];  Service: Cardiovascular;  Laterality: N/A;  . Left heart catheterization with coronary angiogram N/A 01/28/2014    Procedure: LEFT HEART CATHETERIZATION WITH CORONARY ANGIOGRAM;  Surgeon: Blane Ohara, MD;  Location: [facility] CATH LAB;  Service: Cardiovascular;  Laterality: N/A;   Family History  Problem Relation Age of Onset  . Heart attack Brother 40   History  Substance Use Topics  . Smoking status: Never Smoker   . Smokeless tobacco: Never Used  . Alcohol Use: No   OB History    No data available     Review of Systems  Constitutional: Negative for fever, chills, diaphoresis, appetite change and fatigue.  HENT: Negative for mouth sores, sore throat and trouble swallowing.   Eyes: Negative for visual disturbance.  Respiratory: Negative for cough, chest tightness, shortness of breath and wheezing.   Cardiovascular: Negative for chest pain.  Gastrointestinal: Positive for nausea and abdominal  pain. Negative for vomiting, diarrhea and abdominal distention.  Endocrine: Negative for polydipsia, polyphagia and polyuria.  Genitourinary: Negative for dysuria, frequency and hematuria.  Musculoskeletal: Negative for gait problem.  Skin: Negative for color change, pallor and rash.  Neurological: Negative for dizziness, syncope, light-headedness and headaches.  Hematological: Does not bruise/bleed easily.  Psychiatric/Behavioral: Negative for behavioral problems and confusion.      Allergies  Flagyl; Atorvastatin; Codeine; Prednisone; Simvastatin; Sulfa antibiotics; Metronidazole; and Penicillins  Home Medications   Prior to Admission medications     Medication Sig Start Date End Date Taking? Authorizing Provider  ALPRAZolam Duanne Moron) 0.25 MG tablet As needed to help with sleep 01/31/14   Historical Provider, MD  aspirin (ASPIR-81) 81 MG EC tablet Take 81 mg by mouth daily.      Historical Provider, MD  Calcium Carbonate (CALTRATE 600) 1500 MG TABS Take by mouth 2 (two) times daily.     Historical Provider, MD  cephALEXin (KEFLEX) 250 MG capsule Take 250 mg by mouth 4 (four) times daily. For dental procedures 04/10/12   Veryl Speak, MD  cholecalciferol (VITAMIN D) 1000 UNITS tablet Take 1,000 Units by mouth daily.    Historical Provider, MD  ciprofloxacin (CIPRO) 250 MG tablet Take 250 mg by mouth 2 (two) times daily as needed (for UTI).  07/12/13   Historical Provider, MD  esomeprazole (NEXIUM) 40 MG capsule Take 40 mg by mouth daily.      Historical Provider, MD  levothyroxine (SYNTHROID, LEVOTHROID) 50 MCG tablet Take 50 mcg by mouth daily before breakfast.    Historical Provider, MD  metoprolol tartrate (LOPRESSOR) 25 MG tablet Take 0.5 tablets (12.5 mg total) by mouth 2 (two) times daily. 02/25/14   Fay Records, MD  Multiple Vitamins-Minerals (MULTIVITAL) tablet Take 1 tablet by mouth daily.      Historical Provider, MD  nitroGLYCERIN (NITROSTAT) 0.4 MG SL tablet Place 0.4 mg under the tongue every 5 (five) minutes as needed for chest pain.    Historical Provider, MD  ondansetron (ZOFRAN ODT) 4 MG disintegrating tablet Take 1 tablet (4 mg total) by mouth every 8 (eight) hours as needed for nausea. 07/06/14   Tanna Furry, MD  prasugrel (EFFIENT) 10 MG TABS tablet Take 1 tablet (10 mg total) by mouth daily. 02/25/14   Fay Records, MD  rosuvastatin (CRESTOR) 20 MG tablet Take 1 tablet (20 mg total) by mouth daily. 02/25/14   Fay Records, MD  sucralfate (CARAFATE) 1 G tablet Take 1 tablet (1 g total) by mouth 2 (two) times daily. 07/06/14   Tanna Furry, MD  valsartan-hydrochlorothiazide (DIOVAN HCT) 160-12.5 MG per tablet Take 1 tablet by mouth daily.       Historical Provider, MD   BP 147/60 mmHg  Pulse 83  Temp(Src) 98.4 F (36.9 C) (Oral)  Resp 18  Ht 5\' 3"  (1.6 m)  Wt 135 lb (61.236 kg)  BMI 23.92 kg/m2  SpO2 97% Physical Exam  Constitutional: She is oriented to person, place, and time. She appears well-developed and well-nourished. No distress.  HENT:  Head: Normocephalic.  Eyes: Conjunctivae are normal. Pupils are equal, round, and reactive to light. No scleral icterus.  Neck: Normal range of motion. Neck supple. No thyromegaly present.  Cardiovascular: Normal rate and regular rhythm.  Exam reveals no gallop and no friction rub.   No murmur heard. Pulmonary/Chest: Effort normal and breath sounds normal. No respiratory distress. She has no wheezes. She has no rales.  Abdominal: Soft. Bowel sounds are  normal. She exhibits no distension. There is no tenderness. There is no rebound.    Musculoskeletal: Normal range of motion.  Neurological: She is alert and oriented to person, place, and time.  Skin: Skin is warm and dry. No rash noted.  Psychiatric: She has a normal mood and affect. Her behavior is normal.    ED Course  Procedures (including critical care time) Labs Review Labs Reviewed  URINALYSIS, ROUTINE W REFLEX MICROSCOPIC - Abnormal; Notable for the following:    APPearance CLOUDY (*)    Hgb urine dipstick SMALL (*)    Ketones, ur 15 (*)    Leukocytes, UA MODERATE (*)    All other components within normal limits  COMPREHENSIVE METABOLIC PANEL - Abnormal; Notable for the following:    Glucose, Bld 104 (*)    GFR calc non Af Amer 73 (*)    GFR calc Af Amer 85 (*)    All other components within normal limits  URINE MICROSCOPIC-ADD ON - Abnormal; Notable for the following:    Bacteria, UA MANY (*)    All other components within normal limits  CBC WITH DIFFERENTIAL  LIPASE, BLOOD    Imaging Review US Abdomen Complete  07/06/2014   CLINICAL DATA:  RUQ pain radiates to right mid back x 4 days. Mild nausea. No  diarrhea/vomiting. No fevers. WBC not elevated. History of Appendectomy. No relief with home meds.  EXAM: ULTRASOUND ABDOMEN COMPLETE  COMPARISON:  CT, 06/30/2006  FINDINGS: Gallbladder: No gallstones or wall thickening visualized. No sonographic Murphy sign noted.  Common bile duct: Diameter: 3.3 mm  Liver: Normal in overall size. Multiple hypoechoic anechoic lesions. Largest lies in the left lobe inferiorly, near the falciform ligament, measuring 2.2 cm x 2.1 cm x 3 cm. A second lesion is seen also in the left lobe adjacent to the falciform ligament, slightly smaller measuring 2 cm in greatest dimension. Both of these 2 lesions appear to be simple liver cysts.  Hypoechoic lesion is noted in the right lobe anteriorly measuring 2.2 cm x 1.3 cm x 1.8 cm. This could be a solid lesion or a complicated cystic lesion.  IVC: No abnormality visualized.  Pancreas: Hyperechoic consistent with fatty replacement but otherwise unremarkable. Portions of the inferior head were not visualized due to overlying bowel gas.  Spleen: Size and appearance within normal limits.  Right Kidney: Length: 9.7 cm. Echogenicity within normal limits. No mass or hydronephrosis visualized.  Left Kidney: Length: 11.1 cm. Normal parenchymal echogenicity. Hypo to anechoic lesion noted along the midpole measuring 18 mm in greatest dimension. This is most likely a cyst although it could be complicated. A sub cm cyst was noted in this location on the prior CT. No other left renal masses, no stones and no hydronephrosis.  Abdominal aorta: Diffuse atherosclerotic irregularity.  No aneurysm.  Other findings: None.  IMPRESSION: 1. No acute findings. 2. Two cysts lie in the left lobe of the liver adjacent to the falciform ligament. 3. 2.2 cm hypoechoic lesion noted in the anterior right lobe of the liver is more nonspecific. Although this could reflect a cyst, it has internal echoes and could potentially be a solid lesion. On the prior CT, there was a hypo  attenuating nonenhancing lesion in this location. This would support the current lesion being a cyst mildly complicated with internal debris. 4. Normal gallbladder.  No bile duct dilation. 5. Left renal cystic lesion. Although this may be a simple cyst, it is not well enough defined to confidently  assess this as a simple cyst. Recommend followup ultrasound in 4-6 months to reassess. Earlier characterization of this lesion could be performed with MRI with and without contrast if desired clinically.   Electronically Signed   By: Lajean Manes M.D.   On: 07/06/2014 11:59     EKG Interpretation None      MDM   Final diagnoses:  Abdominal pain   Patient's ultrasound shows a hepatic cyst. I discussed this with her at length. No signs of biliary stones are but Dr. dilatation. Normal enzymes. No leukocytosis. No urinary tract infection. After IV Zofran she was feeling much improved. Plan will be adding Carafate to her Nexium. Stop her iron. Decrease aspirin to every other day. Primary care follow-up.    Tanna Furry, MD 07/06/14 [phone]

## 2014-07-06 NOTE — ED Notes (Signed)
Patient here with right sided abdominal pain since Wednesday. Reports that she has had nausea with same and decreased appetite, no other associated symptroms

## 2014-07-06 NOTE — Discharge Instructions (Signed)
Stop your iron. It can be irritating to your stomach. Decrease your aspirin to every other day. Carafate twice a day. Continue your Nexium. Recheck with her primary care physician with recurrence or worsening.  Abdominal Pain Many things can cause abdominal pain. Usually, abdominal pain is not caused by a disease and will improve without treatment. It can often be observed and treated at home. Your health care provider will do a physical exam and possibly order blood tests and X-rays to help determine the seriousness of your pain. However, in many cases, more time must pass before a clear cause of the pain can be found. Before that point, your health care provider may not know if you need more testing or further treatment. HOME CARE INSTRUCTIONS  Monitor your abdominal pain for any changes. The following actions may help to alleviate any discomfort you are experiencing:  Only take over-the-counter or prescription medicines as directed by your health care provider.  Do not take laxatives unless directed to do so by your health care provider.  Try a clear liquid diet (broth, tea, or water) as directed by your health care provider. Slowly move to a bland diet as tolerated. SEEK MEDICAL CARE IF:  You have unexplained abdominal pain.  You have abdominal pain associated with nausea or diarrhea.  You have pain when you urinate or have a bowel movement.  You experience abdominal pain that wakes you in the night.  You have abdominal pain that is worsened or improved by eating food.  You have abdominal pain that is worsened with eating fatty foods.  You have a fever. SEEK IMMEDIATE MEDICAL CARE IF:   Your pain does not go away within 2 hours.  You keep throwing up (vomiting).  Your pain is felt only in portions of the abdomen, such as the right side or the left lower portion of the abdomen.  You pass bloody or black tarry stools. MAKE SURE YOU:  Understand these instructions.    Will watch your condition.   Will get help right away if you are not doing well or get worse.  Document Released: 04/21/2005 Document Revised: 07/17/2013 Document Reviewed: 03/21/2013 [facility] Patient Information 2015 [place], [place]. This information is not intended to replace advice given to you by your health care provider. Make sure you discuss any questions you have with your health care provider.

## 2014-07-08 ENCOUNTER — Telehealth: Payer: Self-pay | Admitting: Internal Medicine

## 2014-07-08 ENCOUNTER — Telehealth: Payer: Self-pay | Admitting: *Deleted

## 2014-07-08 DIAGNOSIS — Z1231 Encounter for screening mammogram for malignant neoplasm of breast: Secondary | ICD-10-CM | POA: Diagnosis not present

## 2014-07-08 NOTE — Telephone Encounter (Signed)
Crestor and effient samples provided for patient.

## 2014-08-02 ENCOUNTER — Other Ambulatory Visit: Payer: Self-pay | Admitting: *Deleted

## 2014-08-02 MED ORDER — ROSUVASTATIN CALCIUM 20 MG PO TABS
20.0000 mg | ORAL_TABLET | Freq: Every day | ORAL | Status: DC
Start: 2014-08-02 — End: 2014-08-26

## 2014-08-21 DIAGNOSIS — J329 Chronic sinusitis, unspecified: Secondary | ICD-10-CM | POA: Diagnosis not present

## 2014-08-21 DIAGNOSIS — R350 Frequency of micturition: Secondary | ICD-10-CM | POA: Diagnosis not present

## 2014-08-25 NOTE — Progress Notes (Signed)
HPI  Patient is a 69 yo with a history of CAD and palpitations.  She was previously followed by [facility]  I saw her last year She was admitted to [facility] in October with unstable angina.  Cath showed Mild dz of LAD, patient stent to LCx with 10 to 20% instent restenosis.  The RCA had a 60 to 70% mid lesion that had FFR of 0.73  She underwent PTCA/Xience stent placement to RCA [facility] was admitted in July 2015  with CP  Underewent cardiac cath  This showed Left anterior descending (LAD): The LAD is patent to the distal anterior wall. There is mild calcification with mild nonobstructive plaque noted in the proximal and mid vessel. Left circumflex (LCx):  patent. The proximal circumflex was stented into the first obtuse marginal branch with very mild in-stent restenosis of no greater than 20%. Right coronary artery (RCA): The RCA is dominant. The stented segment in the mid vessel is widely patent with no significant in-stent restenosis.  There is mild 50% stenosis involving the posterior AV segment. Left ventriculography: Left ventricular systolic function is normal, LVEF is estimated at 55-65%, there is no significant mitral regurgitation. There is heavy mitral annular calcification present.    SInce I saw the patinet in the fall she has done well  No CP  Breathing OK   Past Medical History  Diagnosis Date  . Premature ventricular contractions   . Coronary atherosclerosis of native coronary artery     prior DES to the marginal of the LCX in January of 2012  . HLD (hyperlipidemia)   . HTN (hypertension)   . Hiatal hernia   . Hypothyroidism   . Osteoporosis   . Palpitations   . Heart murmur   . Anginal pain   . Exertional shortness of breath   . History of blood transfusion     "after daughter was born" (05/22/2013)  . Sinus headache   . Recurrent UTI     "q 4 months for the last couple years; last one was 12/2012" (05/22/2013)  . Tremors of nervous system     by dr. love    Current  Outpatient Prescriptions  Medication Sig Dispense Refill  . ALPRAZolam (XANAX) 0.25 MG tablet As needed to help with sleep    . aspirin (ASPIR-81) 81 MG EC tablet Take 81 mg by mouth daily.      . Calcium Carbonate (CALTRATE 600) 1500 MG TABS Take by mouth 2 (two) times daily.     . cephALEXin (KEFLEX) 250 MG capsule Take 250 mg by mouth 4 (four) times daily. For dental procedures    . cholecalciferol (VITAMIN D) 1000 UNITS tablet Take 1,000 Units by mouth daily.    . ciprofloxacin (CIPRO) 250 MG tablet Take 250 mg by mouth 2 (two) times daily as needed (for UTI).     Marland Kitchen esomeprazole (NEXIUM) 40 MG capsule Take 40 mg by mouth daily.      Marland Kitchen levothyroxine (SYNTHROID, LEVOTHROID) 50 MCG tablet Take 50 mcg by mouth daily before breakfast.    . metoprolol tartrate (LOPRESSOR) 25 MG tablet Take 0.5 tablets (12.5 mg total) by mouth 2 (two) times daily. 30 tablet 5  . Multiple Vitamins-Minerals (MULTIVITAL) tablet Take 1 tablet by mouth daily.      . nitroGLYCERIN (NITROSTAT) 0.4 MG SL tablet Place 0.4 mg under the tongue every 5 (five) minutes as needed for chest pain.    Marland Kitchen ondansetron (ZOFRAN ODT) 4 MG disintegrating tablet Take 1  tablet (4 mg total) by mouth every 8 (eight) hours as needed for nausea. 6 tablet 0  . prasugrel (EFFIENT) 10 MG TABS tablet Take 1 tablet (10 mg total) by mouth daily. 30 tablet 5  . rosuvastatin (CRESTOR) 20 MG tablet Take 1 tablet (20 mg total) by mouth daily. 90 tablet 1  . sucralfate (CARAFATE) 1 G tablet Take 1 tablet (1 g total) by mouth 2 (two) times daily. 60 tablet 0  . valsartan-hydrochlorothiazide (DIOVAN HCT) 160-12.5 MG per tablet Take 1 tablet by mouth daily.       No current facility-administered medications for this visit.    Allergies  Allergen Reactions  . Flagyl [Metronidazole Hcl] Anaphylaxis  . Atorvastatin     REACTION: myaligia  . Codeine Hives  . Prednisone     Makes my heart race  . Simvastatin     REACTION: myalgia  . Sulfa Antibiotics      Jittery  . Metronidazole Nausea And Vomiting and Rash  . Penicillins Nausea And Vomiting and Rash    Family History  Problem Relation Age of Onset  . Heart attack Brother 40    History   Social History  . Marital Status: Married    Spouse Name: N/A    Number of Children: N/A  . Years of Education: N/A   Occupational History  . Retired Oceanographer    Social History Main Topics  . Smoking status: Never Smoker   . Smokeless tobacco: Never Used  . Alcohol Use: No  . Drug Use: No  . Sexual Activity: Not Currently   Other Topics Concern  . Not on file   Social History Narrative    ROS ALL NEGATIVE EXCEPT THOSE NOTED IN HPI  PE Patient is in NAD BP 146/82  P 80  Wt 138   General Appearance: well developed, well nourished in no acute distress HEENT: symmetrical face, PERRLA  Neck: no JVD or bruits Chest: symmetric without deformity Cardiac: PMI non-displaced, RRR, normal S1, S2, no gallop or murmur Lung: clear to ausculation and percussion Abdominal: nondistended, nontender, good bowel sounds, no HSM, no bruits Extremities: no cyanosis, clubbing or edema 2+ pulses Skin: normal color, no rashes Neuro: alert and oriented x 3, non-focal Pysch: normal affect    BMET    Component Value Date/Time   NA 143 07/06/2014 0955   K 3.9 07/06/2014 0955   CL 102 07/06/2014 0955   CO2 27 07/06/2014 0955   GLUCOSE 104* 07/06/2014 0955   BUN 12 07/06/2014 0955   CREATININE 0.80 07/06/2014 0955   CALCIUM 9.5 07/06/2014 0955   GFRNONAA 73* 07/06/2014 0955   GFRAA 85* 07/06/2014 0955    Lipid Panel     Component Value Date/Time   CHOL 145 01/28/2014 0323   TRIG 161* 01/28/2014 0323   HDL 51 01/28/2014 0323   CHOLHDL 2.8 01/28/2014 0323   VLDL 32 01/28/2014 0323   LDLCALC 62 01/28/2014 0323    CBC    Component Value Date/Time   WBC 8.9 07/06/2014 0955   RBC 4.61 07/06/2014 0955   HGB 13.9 07/06/2014 0955   HCT 41.7 07/06/2014 0955   PLT 246  07/06/2014 0955   MCV 90.5 07/06/2014 0955   MCH 30.2 07/06/2014 0955   MCHC 33.3 07/06/2014 0955   RDW 13.2 07/06/2014 0955   LYMPHSABS 1.7 07/06/2014 0955   MONOABS 0.4 07/06/2014 0955   EOSABS 0.1 07/06/2014 0955   BASOSABS 0.0 07/06/2014 0955    Impression:  1.  CAD No symptoms to suggest angina  I have review records with M [facility]  Could switch to Plavix and continue for 1 more yr then d/c  Patinet would like to stay on effient  Would resume asa daily instead of qod   2.  HL  Keep on crestor    F/U in fall

## 2014-08-26 ENCOUNTER — Encounter: Payer: Self-pay | Admitting: Internal Medicine

## 2014-08-26 ENCOUNTER — Ambulatory Visit (INDEPENDENT_AMBULATORY_CARE_PROVIDER_SITE_OTHER): Payer: Medicare Other | Admitting: Internal Medicine

## 2014-08-26 VITALS — BP 146/82 | HR 80 | Ht 63.0 in | Wt 137.8 lb

## 2014-08-26 DIAGNOSIS — E785 Hyperlipidemia, unspecified: Secondary | ICD-10-CM | POA: Diagnosis not present

## 2014-08-26 DIAGNOSIS — I10 Essential (primary) hypertension: Secondary | ICD-10-CM

## 2014-08-26 MED ORDER — PRASUGREL HCL 10 MG PO TABS: 10.0000 mg | ORAL_TABLET | Freq: Every day | ORAL | Status: DC

## 2014-08-26 MED ORDER — ROSUVASTATIN CALCIUM 20 MG PO TABS: 20.0000 mg | ORAL_TABLET | Freq: Every day | ORAL | Status: DC

## 2014-08-26 MED ORDER — METOPROLOL TARTRATE 25 MG PO TABS: 12.5000 mg | ORAL_TABLET | Freq: Two times a day (BID) | ORAL | Status: DC

## 2014-08-26 NOTE — Patient Instructions (Addendum)
Your physician recommends that you continue on your current medications as directed. Please refer to the Current Medication list given to you today.  Your physician wants you to follow-up in: October 2016 WITH DR. Harrington Challenger.  You will receive a reminder letter in the mail two months in advance. If you don't receive a letter, please call our office to schedule the follow-up appointment.

## 2014-08-29 NOTE — Telephone Encounter (Signed)
error 

## 2014-09-06 ENCOUNTER — Telehealth: Payer: Self-pay

## 2014-09-06 NOTE — Telephone Encounter (Signed)
Called [facility] for pre authoriztion of Crestor 20 mg by mouth daily. There is no pre authorization for this medication, patient will be able to fill on 09/07/2014.

## 2014-09-11 ENCOUNTER — Other Ambulatory Visit: Payer: Self-pay | Admitting: Internal Medicine

## 2014-09-21 ENCOUNTER — Other Ambulatory Visit: Payer: Self-pay | Admitting: Internal Medicine

## 2014-09-27 DIAGNOSIS — H43811 Vitreous degeneration, right eye: Secondary | ICD-10-CM | POA: Diagnosis not present

## 2014-09-27 DIAGNOSIS — H2513 Age-related nuclear cataract, bilateral: Secondary | ICD-10-CM | POA: Diagnosis not present

## 2014-10-08 ENCOUNTER — Other Ambulatory Visit: Payer: Self-pay | Admitting: Internal Medicine

## 2014-11-06 ENCOUNTER — Other Ambulatory Visit: Payer: Self-pay | Admitting: Internal Medicine

## 2014-11-08 ENCOUNTER — Telehealth: Payer: Self-pay | Admitting: *Deleted

## 2014-11-08 NOTE — Telephone Encounter (Signed)
Effient samples placed at the front desk for patient.

## 2014-11-19 DIAGNOSIS — R350 Frequency of micturition: Secondary | ICD-10-CM | POA: Diagnosis not present

## 2015-01-13 ENCOUNTER — Telehealth: Payer: Self-pay | Admitting: Internal Medicine

## 2015-01-13 NOTE — Telephone Encounter (Signed)
OK to refill prescriptions.  WIll see later this summer

## 2015-01-13 NOTE — Telephone Encounter (Signed)
Called.  Patient thought she had an appointment in July and it isn't until October.  Concerned about needing refills before October. Medications reviewed, possibility of needing three RX refills in July.  Routed to Dr. Harrington Challenger and Rodman Key RN

## 2015-01-13 NOTE — Telephone Encounter (Signed)
New Message     Pt wants to speak to RN the patient has questions about the medication amount and refills  She wants to know if she has to see Dr. Harrington Challenger in order for her to get her prescriptions.

## 2015-01-14 MED ORDER — METOPROLOL TARTRATE 25 MG PO TABS: 12.5000 mg | ORAL_TABLET | Freq: Two times a day (BID) | ORAL | Status: DC

## 2015-01-14 MED ORDER — ROSUVASTATIN CALCIUM 20 MG PO TABS: ORAL_TABLET | ORAL | Status: DC

## 2015-01-14 MED ORDER — PRASUGREL HCL 10 MG PO TABS: 10.0000 mg | ORAL_TABLET | Freq: Every day | ORAL | Status: DC

## 2015-01-14 NOTE — Telephone Encounter (Signed)
1 month of effient given to patient.  Refills for Metoprolol and Crestor sent in to pharmacy

## 2015-01-14 NOTE — Telephone Encounter (Signed)
Talked to patient this morning and adding refills of Metoprolol, crestor and effiant

## 2015-01-14 NOTE — Addendum Note (Signed)
Addended by: Carilyn Goodpasture on: 01/14/2015 09:06 AM   Modules accepted: Orders

## 2015-01-20 ENCOUNTER — Other Ambulatory Visit: Payer: Self-pay

## 2015-01-27 ENCOUNTER — Observation Stay (HOSPITAL_BASED_OUTPATIENT_CLINIC_OR_DEPARTMENT_OTHER)
Admission: EM | Admit: 2015-01-27 | Discharge: 2015-01-28 | Disposition: A | Discharge status: Home / Self Care | Payer: Medicare Other | Attending: Internal Medicine | Admitting: Internal Medicine

## 2015-01-27 ENCOUNTER — Encounter (HOSPITAL_BASED_OUTPATIENT_CLINIC_OR_DEPARTMENT_OTHER): Payer: Self-pay | Admitting: *Deleted

## 2015-01-27 ENCOUNTER — Emergency Department (HOSPITAL_BASED_OUTPATIENT_CLINIC_OR_DEPARTMENT_OTHER): Payer: Medicare Other

## 2015-01-27 DIAGNOSIS — E785 Hyperlipidemia, unspecified: Secondary | ICD-10-CM | POA: Insufficient documentation

## 2015-01-27 DIAGNOSIS — Z885 Allergy status to narcotic agent status: Secondary | ICD-10-CM | POA: Insufficient documentation

## 2015-01-27 DIAGNOSIS — R0789 Other chest pain: Secondary | ICD-10-CM

## 2015-01-27 DIAGNOSIS — I25110 Atherosclerotic heart disease of native coronary artery with unstable angina pectoris: Secondary | ICD-10-CM | POA: Insufficient documentation

## 2015-01-27 DIAGNOSIS — Z882 Allergy status to sulfonamides status: Secondary | ICD-10-CM | POA: Insufficient documentation

## 2015-01-27 DIAGNOSIS — E039 Hypothyroidism, unspecified: Secondary | ICD-10-CM | POA: Insufficient documentation

## 2015-01-27 DIAGNOSIS — E876 Hypokalemia: Secondary | ICD-10-CM

## 2015-01-27 DIAGNOSIS — Z87440 Personal history of urinary (tract) infections: Secondary | ICD-10-CM | POA: Insufficient documentation

## 2015-01-27 DIAGNOSIS — Z880 Allergy status to penicillin: Secondary | ICD-10-CM | POA: Insufficient documentation

## 2015-01-27 DIAGNOSIS — Z888 Allergy status to other drugs, medicaments and biological substances status: Secondary | ICD-10-CM | POA: Diagnosis not present

## 2015-01-27 DIAGNOSIS — M810 Age-related osteoporosis without current pathological fracture: Secondary | ICD-10-CM | POA: Insufficient documentation

## 2015-01-27 DIAGNOSIS — Z79899 Other long term (current) drug therapy: Secondary | ICD-10-CM | POA: Diagnosis not present

## 2015-01-27 DIAGNOSIS — J984 Other disorders of lung: Secondary | ICD-10-CM | POA: Diagnosis not present

## 2015-01-27 DIAGNOSIS — I10 Essential (primary) hypertension: Secondary | ICD-10-CM | POA: Diagnosis present

## 2015-01-27 DIAGNOSIS — R531 Weakness: Secondary | ICD-10-CM | POA: Insufficient documentation

## 2015-01-27 DIAGNOSIS — R079 Chest pain, unspecified: Secondary | ICD-10-CM | POA: Diagnosis not present

## 2015-01-27 DIAGNOSIS — R251 Tremor, unspecified: Secondary | ICD-10-CM | POA: Insufficient documentation

## 2015-01-27 DIAGNOSIS — R5383 Other fatigue: Secondary | ICD-10-CM

## 2015-01-27 DIAGNOSIS — Z881 Allergy status to other antibiotic agents status: Secondary | ICD-10-CM | POA: Insufficient documentation

## 2015-01-27 DIAGNOSIS — Z7982 Long term (current) use of aspirin: Secondary | ICD-10-CM | POA: Insufficient documentation

## 2015-01-27 DIAGNOSIS — T502X5A Adverse effect of carbonic-anhydrase inhibitors, benzothiadiazides and other diuretics, initial encounter: Secondary | ICD-10-CM | POA: Diagnosis not present

## 2015-01-27 LAB — CBC WITH DIFFERENTIAL/PLATELET
BASOS ABS: 0 10*3/uL (ref 0.0–0.1)
Basophils Relative: 0 % (ref 0–1)
EOS ABS: 0.3 10*3/uL (ref 0.0–0.7)
EOS PCT: 3 % (ref 0–5)
HEMATOCRIT: 40.5 % (ref 36.0–46.0)
HEMOGLOBIN: 13 g/dL (ref 12.0–15.0)
Lymphocytes Relative: 37 % (ref 12–46)
Lymphs Abs: 3.7 10*3/uL (ref 0.7–4.0)
MCH: 29 pg (ref 26.0–34.0)
MCHC: 32.1 g/dL (ref 30.0–36.0)
MCV: 90.2 fL (ref 78.0–100.0)
MONO ABS: 0.6 10*3/uL (ref 0.1–1.0)
MONOS PCT: 6 % (ref 3–12)
NEUTROS PCT: 54 % (ref 43–77)
Neutro Abs: 5.4 10*3/uL (ref 1.7–7.7)
PLATELETS: 244 10*3/uL (ref 150–400)
RBC: 4.49 MIL/uL (ref 3.87–5.11)
RDW: 13 % (ref 11.5–15.5)
WBC: 10.1 10*3/uL (ref 4.0–10.5)

## 2015-01-27 LAB — PROTIME-INR
INR: 0.93 (ref 0.00–1.49)
Prothrombin Time: 12.7 seconds (ref 11.6–15.2)

## 2015-01-27 LAB — TSH: TSH: 4.519 u[IU]/mL — AB (ref 0.350–4.500)

## 2015-01-27 LAB — COMPREHENSIVE METABOLIC PANEL
ALBUMIN: 4.3 g/dL (ref 3.5–5.0)
ALT: 24 U/L (ref 14–54)
AST: 31 U/L (ref 15–41)
Alkaline Phosphatase: 82 U/L (ref 38–126)
Anion gap: 10 (ref 5–15)
BUN: 18 mg/dL (ref 6–20)
CO2: 30 mmol/L (ref 22–32)
Calcium: 9.1 mg/dL (ref 8.9–10.3)
Chloride: 97 mmol/L — ABNORMAL LOW (ref 101–111)
Creatinine, Ser: 0.87 mg/dL (ref 0.44–1.00)
GFR calc non Af Amer: >60 mL/min (ref >60)
Glucose, Bld: 129 mg/dL — ABNORMAL HIGH (ref 65–99)
POTASSIUM: 3.2 mmol/L — AB (ref 3.5–5.1)
Sodium: 137 mmol/L (ref 135–145)
Total Bilirubin: 1.1 mg/dL (ref 0.3–1.2)
Total Protein: 7.1 g/dL (ref 6.5–8.1)

## 2015-01-27 LAB — URINALYSIS, ROUTINE W REFLEX MICROSCOPIC
Bilirubin Urine: NEGATIVE
Glucose, UA: NEGATIVE mg/dL
Ketones, ur: NEGATIVE mg/dL
Nitrite: NEGATIVE
PH: 5.5 (ref 5.0–8.0)
PROTEIN: NEGATIVE mg/dL
Specific Gravity, Urine: 1.013 (ref 1.005–1.030)
UROBILINOGEN UA: 0.2 mg/dL (ref 0.0–1.0)

## 2015-01-27 LAB — URINE MICROSCOPIC-ADD ON

## 2015-01-27 LAB — TROPONIN I: Troponin I: <0.03 ng/mL (ref <0.031)

## 2015-01-27 LAB — APTT: aPTT: 28 seconds (ref 24–37)

## 2015-01-27 LAB — MAGNESIUM: Magnesium: 2 mg/dL (ref 1.7–2.4)

## 2015-01-27 MED ORDER — POTASSIUM CHLORIDE CRYS ER 20 MEQ PO TBCR: 20.0000 meq | EXTENDED_RELEASE_TABLET | Freq: Every day | ORAL | Status: DC

## 2015-01-27 MED ORDER — ONDANSETRON HCL 4 MG/2ML IJ SOLN: 4.0000 mg | Freq: Three times a day (TID) | INTRAMUSCULAR | Status: DC | PRN

## 2015-01-27 MED ORDER — POTASSIUM CHLORIDE CRYS ER 20 MEQ PO TBCR
40.0000 meq | EXTENDED_RELEASE_TABLET | Freq: Once | ORAL | Status: AC
  Administered 2015-01-27: 40 meq via ORAL
  Filled 2015-01-27: qty 2

## 2015-01-27 MED ORDER — METOPROLOL TARTRATE 12.5 MG HALF TABLET
12.5000 mg | ORAL_TABLET | Freq: Once | ORAL | Status: AC
  Administered 2015-01-27: 12.5 mg via ORAL
  Filled 2015-01-27: qty 1

## 2015-01-27 MED ORDER — PRASUGREL HCL 10 MG PO TABS
10.0000 mg | ORAL_TABLET | Freq: Every day | ORAL | Status: DC
  Filled 2015-01-27: qty 1

## 2015-01-27 NOTE — Discharge Instructions (Signed)
Please follow with your primary care doctor in the next 2 days for a check-up. They must obtain records for further management.   Do not hesitate to return to the Emergency Department for any new, worsening or concerning symptoms.    Fatigue Fatigue is a feeling of tiredness, lack of energy, lack of motivation, or feeling tired all the time. Having enough rest, good nutrition, and reducing stress will normally reduce fatigue. Consult your caregiver if it persists. The nature of your fatigue will help your caregiver to find out its cause. The treatment is based on the cause.  CAUSES  There are many causes for fatigue. Most of the time, fatigue can be traced to one or more of your habits or routines. Most causes fit into one or more of three general areas. They are: Lifestyle problems  Sleep disturbances.  Overwork.  Physical exertion.  Unhealthy habits.  Poor eating habits or eating disorders.  Alcohol and/or drug use .  Lack of proper nutrition (malnutrition). Psychological problems  Stress and/or anxiety problems.  Depression.  Grief.  Boredom. Medical Problems or Conditions  Anemia.  Pregnancy.  Thyroid gland problems.  Recovery from major surgery.  Continuous pain.  Emphysema or asthma that is not well controlled  Allergic conditions.  Diabetes.  Infections (such as mononucleosis).  Obesity.  Sleep disorders, such as sleep apnea.  Heart failure or other heart-related problems.  Cancer.  Kidney disease.  Liver disease.  Effects of certain medicines such as antihistamines, cough and cold remedies, prescription pain medicines, heart and blood pressure medicines, drugs used for treatment of cancer, and some antidepressants. SYMPTOMS  The symptoms of fatigue include:   Lack of energy.  Lack of drive (motivation).  Drowsiness.  Feeling of indifference to the surroundings. DIAGNOSIS  The details of how you feel help guide your caregiver in  finding out what is causing the fatigue. You will be asked about your present and past health condition. It is important to review all medicines that you take, including prescription and non-prescription items. A thorough exam will be done. You will be questioned about your feelings, habits, and normal lifestyle. Your caregiver may suggest blood tests, urine tests, or other tests to look for common medical causes of fatigue.  TREATMENT  Fatigue is treated by correcting the underlying cause. For example, if you have continuous pain or depression, treating these causes will improve how you feel. Similarly, adjusting the dose of certain medicines will help in reducing fatigue.  HOME CARE INSTRUCTIONS   Try to get the required amount of good sleep every night.  Eat a healthy and nutritious diet, and drink enough water throughout the day.  Practice ways of relaxing (including yoga or meditation).  Exercise regularly.  Make plans to change situations that cause stress. Act on those plans so that stresses decrease over time. Keep your work and personal routine reasonable.  Avoid street drugs and minimize use of alcohol.  Start taking a daily multivitamin after consulting your caregiver. SEEK MEDICAL CARE IF:   You have persistent tiredness, which cannot be accounted for.  You have fever.  You have unintentional weight loss.  You have headaches.  You have disturbed sleep throughout the night.  You are feeling sad.  You have constipation.  You have dry skin.  You have gained weight.  You are taking any new or different medicines that you suspect are causing fatigue.  You are unable to sleep at night.  You develop any unusual swelling of  your legs or other parts of your body. SEEK IMMEDIATE MEDICAL CARE IF:   You are feeling confused.  Your vision is blurred.  You feel faint or pass out.  You develop severe headache.  You develop severe abdominal, pelvic, or back  pain.  You develop chest pain, shortness of breath, or an irregular or fast heartbeat.  You are unable to pass a normal amount of urine.  You develop abnormal bleeding such as bleeding from the rectum or you vomit blood.  You have thoughts about harming yourself or committing suicide.  You are worried that you might harm someone else. MAKE SURE YOU:   Understand these instructions.  Will watch your condition.  Will get help right away if you are not doing well or get worse. Document Released: 05/09/2007 Document Revised: 10/04/2011 Document Reviewed: 11/13/2013 [facility] Patient Information 2015 [place], [place]. This information is not intended to replace advice given to you by your health care provider. Make sure you discuss any questions you have with your health care provider.  Hypokalemia Hypokalemia means that the amount of potassium in the blood is lower than normal.Potassium is a chemical, called an electrolyte, that helps regulate the amount of fluid in the body. It also stimulates muscle contraction and helps nerves function properly.Most of the body's potassium is inside of cells, and only a very small amount is in the blood. Because the amount in the blood is so small, minor changes can be life-threatening. CAUSES  Antibiotics.  Diarrhea or vomiting.  Using laxatives too much, which can cause diarrhea.  Chronic kidney disease.  Water pills (diuretics).  Eating disorders (bulimia).  Low magnesium level.  Sweating a lot. SIGNS AND SYMPTOMS  Weakness.  Constipation.  Fatigue.  Muscle cramps.  Mental confusion.  Skipped heartbeats or irregular heartbeat (palpitations).  Tingling or numbness. DIAGNOSIS  Your health care provider can diagnose hypokalemia with blood tests. In addition to checking your potassium level, your health care provider may also check other lab tests. TREATMENT Hypokalemia can be treated with potassium supplements taken by mouth  or adjustments in your current medicines. If your potassium level is very low, you may need to get potassium through a vein (IV) and be monitored in the hospital. A diet high in potassium is also helpful. Foods high in potassium are:  Nuts, such as peanuts and pistachios.  Seeds, such as sunflower seeds and pumpkin seeds.  Peas, lentils, and lima beans.  Whole grain and bran cereals and breads.  Fresh fruit and vegetables, such as apricots, avocado, bananas, cantaloupe, kiwi, oranges, tomatoes, asparagus, and potatoes.  Orange and tomato juices.  Red meats.  Fruit yogurt. HOME CARE INSTRUCTIONS  Take all medicines as prescribed by your health care provider.  Maintain a healthy diet by including nutritious food, such as fruits, vegetables, nuts, whole grains, and lean meats.  If you are taking a laxative, be sure to follow the directions on the label. SEEK MEDICAL CARE IF:  Your weakness gets worse.  You feel your heart pounding or racing.  You are vomiting or having diarrhea.  You are diabetic and having trouble keeping your blood glucose in the normal range. SEEK IMMEDIATE MEDICAL CARE IF:  You have chest pain, shortness of breath, or dizziness.  You are vomiting or having diarrhea for more than 2 days.  You faint. MAKE SURE YOU:   Understand these instructions.  Will watch your condition.  Will get help right away if you are not doing well or get  worse. Document Released: 07/12/2005 Document Revised: 05/02/2013 Document Reviewed: 01/12/2013 [facility] Patient Information 2015 [place], [place]. This information is not intended to replace advice given to you by your health care provider. Make sure you discuss any questions you have with your health care provider.

## 2015-01-27 NOTE — ED Notes (Signed)
Call to [place] unavailable at this time to take report

## 2015-01-27 NOTE — Progress Notes (Signed)
Received pt report from [facility].

## 2015-01-27 NOTE — ED Notes (Signed)
Weakness for a while. Worse when she gets a UTI. She is anemic. States she has palpitations when she has episodes of weakness. Ambulatory to tx room.

## 2015-01-27 NOTE — ED Provider Notes (Signed)
CSN: [id number]     Arrival date & time 01/27/15  1721 History   First MD Initiated Contact with Patient 01/27/15 1732     Chief Complaint  Patient presents with  . Weakness     (Consider location/radiation/quality/duration/timing/severity/associated sxs/prior Treatment) HPI   Blood pressure 145/67, pulse 74, temperature 98.8 F (37.1 C), temperature source Oral, resp. rate 14, height 5\' 2"  (1.575 m), weight 137 lb (62.143 kg), SpO2 96 %.  [name] is a 69 y.o. female complaining of generalized fatigue onset 3 days ago significantly worsening this afternoon. Patient is able to ambulate and carry on her ADLs but she wanted to come and get checked out. Patient denies fever, chills, nausea, vomiting, chest pain, shortness of breath, cough, increasing peripheral edema. She states that sometimes she gets very fatigued when she has a urinary tract infection, states that she never has dysuria but she has a sensation of incomplete void when she has UTIs and she does not have that at this time. She does not know the last time her TSH was checked.  Past Medical History  Diagnosis Date  . Premature ventricular contractions   . Coronary atherosclerosis of native coronary artery     prior DES to the marginal of the LCX in January of 2012  . HLD (hyperlipidemia)   . HTN (hypertension)   . Hiatal hernia   . Hypothyroidism   . Osteoporosis   . Palpitations   . Heart murmur   . Anginal pain   . Exertional shortness of breath   . History of blood transfusion     "after daughter was born" (05/22/2013)  . Sinus headache   . Recurrent UTI     "q 4 months for the last couple years; last one was 12/2012" (05/22/2013)  . Tremors of nervous system     by dr. love   Past Surgical History  Procedure Laterality Date  . Tonsillectomy    . Appendectomy    . Total abdominal hysterectomy    . Ectopic pregnancy surgery    . Breast cyst excision Right 1965  . Breast lumpectomy Right 1980's   "benign" (05/22/2013)  . Breast cyst aspiration Bilateral     "numerous in the dr's office" (05/22/2013)  . Cardiac catheterization  ~ 1968; 08/2010  . Coronary angioplasty with stent placement  07/2010  . Laparotomy  1965    "ruptured blood vessel in my side during childbirth; didn't find it til 2 days later; had to open me up to repair it" (05/22/2013)  . Coronary angioplasty with stent placement  05/23/2013    Patent LCx stent (10-20% ISR), 70% mid RCA (FFR 0.73) s/p DES, 20% prox RCA, 40% ostial D1, 60% distal posterior AV branch; EF 65-70%  . Left heart catheterization with coronary angiogram N/A 05/23/2013    Procedure: LEFT HEART CATHETERIZATION WITH CORONARY ANGIOGRAM;  Surgeon: Wellington Hampshire, MD;  Location: [facility] CATH [facility];  Service: Cardiovascular;  Laterality: N/A;  . Left heart catheterization with coronary angiogram N/A 01/28/2014    Procedure: LEFT HEART CATHETERIZATION WITH CORONARY ANGIOGRAM;  Surgeon: Blane Ohara, MD;  Location: [facility] CATH LAB;  Service: Cardiovascular;  Laterality: N/A;   Family History  Problem Relation Age of Onset  . Heart attack Brother 40   History  Substance Use Topics  . Smoking status: Never Smoker   . Smokeless tobacco: Never Used  . Alcohol Use: No   OB History    No data available  Review of Systems  10 systems reviewed and found to be negative, except as noted in the HPI.  Allergies  Flagyl; Atorvastatin; Codeine; Prednisone; Simvastatin; Sulfa antibiotics; Metronidazole; and Penicillins  Home Medications   Prior to Admission medications   Medication Sig Start Date End Date Taking? Authorizing Provider  ALPRAZolam Duanne Moron) 0.25 MG tablet As needed to help with sleep 01/31/14   Historical Provider, MD  aspirin (ASPIR-81) 81 MG EC tablet Take 81 mg by mouth daily.      Historical Provider, MD  Calcium Carbonate (CALTRATE 600) 1500 MG TABS Take by mouth 2 (two) times daily.     Historical Provider, MD  cephALEXin (KEFLEX) 500 MG  capsule Take 500 mg by mouth 2 (two) times daily. 08/21/14   Historical Provider, MD  cholecalciferol (VITAMIN D) 1000 UNITS tablet Take 1,000 Units by mouth daily.    Historical Provider, MD  ciprofloxacin (CIPRO) 250 MG tablet Take 250 mg by mouth 2 (two) times daily as needed (for UTI).  07/12/13   Historical Provider, MD  esomeprazole (NEXIUM) 40 MG capsule Take 40 mg by mouth daily.      Historical Provider, MD  levothyroxine (SYNTHROID, LEVOTHROID) 50 MCG tablet Take 50 mcg by mouth daily before breakfast.    Historical Provider, MD  metoprolol tartrate (LOPRESSOR) 25 MG tablet Take 0.5 tablets (12.5 mg total) by mouth 2 (two) times daily. 01/14/15   Fay Records, MD  Multiple Vitamins-Minerals (MULTIVITAL) tablet Take 1 tablet by mouth daily.      Historical Provider, MD  nitroGLYCERIN (NITROSTAT) 0.4 MG SL tablet Place 0.4 mg under the tongue every 5 (five) minutes as needed for chest pain.    Historical Provider, MD  ondansetron (ZOFRAN ODT) 4 MG disintegrating tablet Take 1 tablet (4 mg total) by mouth every 8 (eight) hours as needed for nausea. 07/06/14   Tanna Furry, MD  potassium chloride SA (K-DUR,KLOR-CON) 20 MEQ tablet Take 1 tablet (20 mEq total) by mouth daily. 01/27/15   Aedyn Kempfer, PA-C  prasugrel (EFFIENT) 10 MG TABS tablet Take 1 tablet (10 mg total) by mouth daily. 01/14/15   Fay Records, MD  rosuvastatin (CRESTOR) 20 MG tablet TAKE 1 TABLET (20 MG TOTAL) BY MOUTH DAILY. 01/14/15   Fay Records, MD  valsartan-hydrochlorothiazide (DIOVAN HCT) 160-12.5 MG per tablet Take 1 tablet by mouth daily.      Historical Provider, MD   BP 164/68 mmHg  Pulse 68  Temp(Src) 98.8 F (37.1 C) (Oral)  Resp 20  Ht 5\' 2"  (1.575 m)  Wt 137 lb (62.143 kg)  BMI 25.05 kg/m2  SpO2 100% Physical Exam  Constitutional: She is oriented to person, place, and time. She appears well-developed and well-nourished. No distress.  HENT:  Head: Normocephalic.  No conjunctival pallor  Eyes: Conjunctivae  and EOM are normal. Pupils are equal, round, and reactive to light.  Cardiovascular: Normal rate, regular rhythm and intact distal pulses.   Pulmonary/Chest: Effort normal and breath sounds normal. No stridor. No respiratory distress. She has no wheezes. She has no rales. She exhibits no tenderness.  Abdominal: Soft. Bowel sounds are normal. She exhibits no distension and no mass. There is no tenderness. There is no rebound and no guarding.  Musculoskeletal: Normal range of motion.  Neurological: She is alert and oriented to person, place, and time.  Psychiatric: She has a normal mood and affect.  Nursing note and vitals reviewed.   ED Course  Procedures (including critical care time) Labs Review  Labs Reviewed  URINALYSIS, ROUTINE W REFLEX MICROSCOPIC (NOT AT [facility]) - Abnormal; Notable for the following:    Hgb urine dipstick SMALL (*)    Leukocytes, UA TRACE (*)    All other components within normal limits  COMPREHENSIVE METABOLIC PANEL - Abnormal; Notable for the following:    Potassium 3.2 (*)    Chloride 97 (*)    Glucose, Bld 129 (*)    All other components within normal limits  CBC WITH DIFFERENTIAL/PLATELET  PROTIME-INR  APTT  URINE MICROSCOPIC-ADD ON  TROPONIN I  MAGNESIUM  TSH  OCCULT BLOOD X 1 CARD TO LAB, STOOL  TROPONIN I   Imaging Review Dg Chest 2 View  01/27/2015   CLINICAL DATA:  Acute onset of generalized weakness. Initial encounter.  EXAM: CHEST  2 VIEW  COMPARISON:  Chest radiograph performed 02/12/2014  FINDINGS: The lungs are well-aerated. Minimal scarring is noted at the right lung apex. There is no evidence of focal opacification, pleural effusion or pneumothorax.  The heart is normal in size; the mediastinal contour is within normal limits. No acute osseous abnormalities are seen.  IMPRESSION: No acute cardiopulmonary process seen.   Electronically Signed   By: Garald Balding M.D.   On: 01/27/2015 20:54     EKG Interpretation   Date/Time:  Monday January 27 2015 20:57:28 EDT Ventricular Rate:  85 PR Interval:  174 QRS Duration: 80 QT Interval:  396 QTC Calculation: 471 R Axis:   25 Text Interpretation:  Normal sinus rhythm with sinus arrhythmia  Nonspecific ST abnormality Abnormal ECG subtle inferior lateral ST  depression Confirmed by Wyvonnia Dusky  MD, [place] [phone]) on 01/27/2015 9:01:25  PM      MDM   Final diagnoses:  Other fatigue  Hypokalemia  Fatigue  Chest tightness or pressure    Filed Vitals:   01/27/15 1800 01/27/15 1830 01/27/15 1920 01/27/15 2006  BP: 165/63 145/67  164/68  Pulse: 74   68  Temp:      TempSrc:      Resp: 15 14  20   Height:      Weight:      SpO2: 96%  97% 100%    Medications  prasugrel (EFFIENT) tablet 10 mg (not administered)  potassium chloride SA (K-DUR,KLOR-CON) CR tablet 40 mEq (40 mEq Oral Given 01/27/15 1919)   [name] is a pleasant 69 y.o. female presenting with generalized faitgue worsening x3 days.  States that this is normally what happens when she has urinary tract infection however, urinalysis is without signs of infection. No anemia seen on CBC, she has a mild hypokalemia at 3.2. EKG is nonischemic, no arrhythmia and unchanged from prior.  8:59 PM patient had acute onset of chest pressure and tightness associated with an episode of weakness. Repeat EKG largely unchanged, may have slight increased inferior depression. Onset while pt was eating crackers. Pt had cath x1 year ago with patent stents, will needs obs for CP rule out. Case discussed with Dr. Hal Hope who accepts admission to [facility] tele bed.  This is a shared visit with the attending physician who personally evaluated the patient and agrees with the care plan.      Monico Blitz, PA-C 01/27/15 2135  Ezequiel Essex, MD 01/27/15 2312

## 2015-01-27 NOTE — Progress Notes (Signed)
[name] [id number] Admission Data: 01/27/2015 11:21 PM Attending Provider: Rise Patience, MD PCP: Melinda Crutch, MD Code Status: Full  [name] is a 69 y.o. female patient admitted from ED:  -No acute distress noted.  -No complaints of shortness of breath.  -No complaints of chest pain.   Cardiac Monitoring: Box # 23 in place. Cardiac monitor yields:normal sinus rhythm.  Blood pressure 132/64, pulse 69, temperature 98.8 F (37.1 C), temperature source Oral, resp. rate 17, height 5\' 2"  (1.575 m), weight 62.007 kg (136 lb 11.2 oz), SpO2 98 %.   IV Fluids:  IV in place, occlusive dsg intact without redness, IV cath antecubital left, condition patent and no redness none.   Allergies:  Flagyl; Atorvastatin; Codeine; Prednisone; Simvastatin; Sulfa antibiotics; Metronidazole; and Penicillins  Past Medical History:   has a past medical history of Premature ventricular contractions; Coronary atherosclerosis of native coronary artery; HLD (hyperlipidemia); HTN (hypertension); Hiatal hernia; Hypothyroidism; Osteoporosis; Palpitations; Heart murmur; Anginal pain; Exertional shortness of breath; History of blood transfusion; Sinus headache; Recurrent UTI; and Tremors of nervous system.  Past Surgical History:   has past surgical history that includes Tonsillectomy; Appendectomy; Total abdominal hysterectomy; Ectopic pregnancy surgery; Breast cyst excision (Right, 1965); Breast lumpectomy (Right, 1980's); Breast cyst aspiration (Bilateral); Cardiac catheterization (~ 1968; 08/2010); Coronary angioplasty with stent (07/2010); laparotomy (8182); Coronary angioplasty with stent (05/23/2013); left heart catheterization with coronary angiogram (N/A, 05/23/2013); and left heart catheterization with coronary angiogram (N/A, 01/28/2014).  Social History:   reports that she has never smoked. She has never used smokeless tobacco. She reports that she does not drink alcohol or use illicit  drugs.  Skin: NSI  Patient/Family orientated to room. Information packet given to patient/family. Admission inpatient armband information verified with patient/family to include name and date of birth and placed on patient arm. Side rails up x 2, fall assessment and education completed with patient/family. Patient/family able to verbalize understanding of risk associated with falls and verbalized understanding to call for assistance before getting out of bed. Call light within reach. Patient/family able to voice and demonstrate understanding of unit orientation instructions.

## 2015-01-28 ENCOUNTER — Observation Stay (HOSPITAL_COMMUNITY): Payer: Medicare Other

## 2015-01-28 ENCOUNTER — Observation Stay (HOSPITAL_BASED_OUTPATIENT_CLINIC_OR_DEPARTMENT_OTHER): Payer: Medicare Other

## 2015-01-28 ENCOUNTER — Encounter (HOSPITAL_COMMUNITY): Payer: Self-pay | Admitting: Internal Medicine

## 2015-01-28 DIAGNOSIS — I10 Essential (primary) hypertension: Secondary | ICD-10-CM

## 2015-01-28 DIAGNOSIS — E039 Hypothyroidism, unspecified: Secondary | ICD-10-CM | POA: Diagnosis not present

## 2015-01-28 DIAGNOSIS — R079 Chest pain, unspecified: Principal | ICD-10-CM

## 2015-01-28 LAB — CBC
HEMATOCRIT: 36.2 % (ref 36.0–46.0)
HEMOGLOBIN: 11.9 g/dL — AB (ref 12.0–15.0)
MCH: 28.7 pg (ref 26.0–34.0)
MCHC: 32.9 g/dL (ref 30.0–36.0)
MCV: 87.4 fL (ref 78.0–100.0)
Platelets: 203 10*3/uL (ref 150–400)
RBC: 4.14 MIL/uL (ref 3.87–5.11)
RDW: 12.8 % (ref 11.5–15.5)
WBC: 8.6 10*3/uL (ref 4.0–10.5)

## 2015-01-28 LAB — TROPONIN I
Troponin I: <0.03 ng/mL (ref <0.031)
Troponin I: <0.03 ng/mL (ref <0.031)

## 2015-01-28 LAB — NM MYOCAR MULTI W/SPECT W/WALL MOTION / EF
LV dias vol: 39 mL
LVSYSVOL: 10 mL
NUC STRESS TID: 0.89
RATE: 0.68
SDS: 2
SRS: 5
SSS: 7

## 2015-01-28 LAB — CREATININE, SERUM: Creatinine, Ser: 0.86 mg/dL (ref 0.44–1.00)

## 2015-01-28 LAB — CK: CK TOTAL: 64 U/L (ref 38–234)

## 2015-01-28 MED ORDER — VITAMIN D3 25 MCG (1000 UNIT) PO TABS
1000.0000 [IU] | ORAL_TABLET | Freq: Every day | ORAL | Status: DC
  Administered 2015-01-28: 1000 [IU] via ORAL
  Filled 2015-01-28: qty 1

## 2015-01-28 MED ORDER — REGADENOSON 0.4 MG/5ML IV SOLN
0.4000 mg | Freq: Once | INTRAVENOUS | Status: AC
Start: 2015-01-28 — End: 2015-01-28
  Administered 2015-01-28: 0.4 mg via INTRAVENOUS
  Filled 2015-01-28: qty 5

## 2015-01-28 MED ORDER — NITROGLYCERIN 0.4 MG SL SUBL: 0.4000 mg | SUBLINGUAL_TABLET | SUBLINGUAL | Status: DC | PRN

## 2015-01-28 MED ORDER — ASPIRIN 81 MG PO TBEC: 81.0000 mg | DELAYED_RELEASE_TABLET | Freq: Every day | ORAL | Status: DC

## 2015-01-28 MED ORDER — VALSARTAN-HYDROCHLOROTHIAZIDE 160-12.5 MG PO TABS: 1.0000 | ORAL_TABLET | Freq: Every day | ORAL | Status: DC

## 2015-01-28 MED ORDER — REGADENOSON 0.4 MG/5ML IV SOLN
INTRAVENOUS | Status: AC
  Filled 2015-01-28: qty 5

## 2015-01-28 MED ORDER — ACETAMINOPHEN 325 MG PO TABS
ORAL_TABLET | ORAL | Status: AC
  Filled 2015-01-28: qty 2

## 2015-01-28 MED ORDER — TECHNETIUM TC 99M SESTAMIBI GENERIC - CARDIOLITE
30.0000 | Freq: Once | INTRAVENOUS | Status: AC | PRN
  Administered 2015-01-28: 30 via INTRAVENOUS

## 2015-01-28 MED ORDER — TECHNETIUM TC 99M SESTAMIBI GENERIC - CARDIOLITE
10.0000 | Freq: Once | INTRAVENOUS | Status: AC | PRN
  Administered 2015-01-28: 10 via INTRAVENOUS

## 2015-01-28 MED ORDER — ONDANSETRON HCL 4 MG/2ML IJ SOLN: 4.0000 mg | Freq: Four times a day (QID) | INTRAMUSCULAR | Status: DC | PRN

## 2015-01-28 MED ORDER — POTASSIUM CHLORIDE CRYS ER 20 MEQ PO TBCR: 20.0000 meq | EXTENDED_RELEASE_TABLET | Freq: Every day | ORAL | Status: DC

## 2015-01-28 MED ORDER — IRBESARTAN 150 MG PO TABS
150.0000 mg | ORAL_TABLET | Freq: Every day | ORAL | Status: DC
  Administered 2015-01-28: 150 mg via ORAL
  Filled 2015-01-28: qty 1

## 2015-01-28 MED ORDER — ONDANSETRON 4 MG PO TBDP
4.0000 mg | ORAL_TABLET | Freq: Three times a day (TID) | ORAL | Status: DC | PRN
Start: 2015-01-28 — End: 2015-01-28
  Filled 2015-01-28: qty 1

## 2015-01-28 MED ORDER — METOPROLOL TARTRATE 12.5 MG HALF TABLET
12.5000 mg | ORAL_TABLET | Freq: Two times a day (BID) | ORAL | Status: DC
  Administered 2015-01-28: 12.5 mg via ORAL
  Filled 2015-01-28 (×3): qty 1

## 2015-01-28 MED ORDER — ACETAMINOPHEN 325 MG PO TABS
650.0000 mg | ORAL_TABLET | ORAL | Status: DC | PRN
  Administered 2015-01-28: 650 mg via ORAL

## 2015-01-28 MED ORDER — METOPROLOL TARTRATE 25 MG PO TABS: 12.5000 mg | ORAL_TABLET | Freq: Two times a day (BID) | ORAL | Status: DC

## 2015-01-28 MED ORDER — SODIUM CHLORIDE 0.9 % IV SOLN
INTRAVENOUS | Status: DC
  Administered 2015-01-28: 07:00:00 via INTRAVENOUS

## 2015-01-28 MED ORDER — ROSUVASTATIN CALCIUM 20 MG PO TABS
20.0000 mg | ORAL_TABLET | Freq: Every day | ORAL | Status: DC
  Administered 2015-01-28: 20 mg via ORAL
  Filled 2015-01-28: qty 1

## 2015-01-28 MED ORDER — ENOXAPARIN SODIUM 40 MG/0.4ML ~~LOC~~ SOLN
40.0000 mg | SUBCUTANEOUS | Status: DC
  Administered 2015-01-28: 40 mg via SUBCUTANEOUS
  Filled 2015-01-28: qty 0.4

## 2015-01-28 MED ORDER — PANTOPRAZOLE SODIUM 40 MG PO TBEC
40.0000 mg | DELAYED_RELEASE_TABLET | Freq: Every day | ORAL | Status: DC
  Administered 2015-01-28: 40 mg via ORAL
  Filled 2015-01-28: qty 1

## 2015-01-28 MED ORDER — SODIUM CHLORIDE 0.9 % IJ SOLN
80.0000 mg | INTRAVENOUS | Status: AC
  Administered 2015-01-28: 80 mg via INTRAVENOUS

## 2015-01-28 MED ORDER — ASPIRIN EC 81 MG PO TBEC
81.0000 mg | DELAYED_RELEASE_TABLET | Freq: Every day | ORAL | Status: DC
  Administered 2015-01-28: 81 mg via ORAL
  Filled 2015-01-28: qty 1

## 2015-01-28 MED ORDER — METOPROLOL TARTRATE 25 MG PO TABS: 25.0000 mg | ORAL_TABLET | Freq: Two times a day (BID) | ORAL | Status: DC

## 2015-01-28 MED ORDER — ALPRAZOLAM 0.25 MG PO TABS: 0.2500 mg | ORAL_TABLET | Freq: Every evening | ORAL | Status: DC | PRN

## 2015-01-28 MED ORDER — HYDROCHLOROTHIAZIDE 12.5 MG PO CAPS
12.5000 mg | ORAL_CAPSULE | Freq: Every day | ORAL | Status: DC
  Administered 2015-01-28: 12.5 mg via ORAL
  Filled 2015-01-28: qty 1

## 2015-01-28 MED ORDER — LEVOTHYROXINE SODIUM 50 MCG PO TABS
50.0000 ug | ORAL_TABLET | Freq: Every day | ORAL | Status: DC
  Administered 2015-01-28: 50 ug via ORAL
  Filled 2015-01-28 (×2): qty 1

## 2015-01-28 NOTE — H&P (Addendum)
[facility] History and Physical  [name]:[id number] DOB: 1945/12/04 DOA: 01/27/2015  Referring physician: Ms.Pisciotta. PCP:  Melinda Crutch, MD  Specialists: Dr.Ross. Cardiologist.  Chief Complaint: Chest pain.  HPI: [name] is a 69 y.o. female history of CAD status post stenting last cardiac cath in July 2015, hyperlipidemia and hypertension presents to the ER because of fatigue. Patient has been feeling weak and fatigued last 3 days. Denies any loss of consciousness or dizziness. Denies any shortness of breath. While in the ER patient started developing retrosternal chest pressure which lasted for around 1-2 minutes and resolve spontaneously. EKG was showing nonspecific status changes in the anteroinferior leads. Troponins were negative chest x-ray was unremarkable. Given the patient's history of CAD at this time patient has been admitted to rule out ACS. Patient is presently asymptomatic. Patient denies any nausea vomiting abdominal pain productive cough fever chills. Denies any added new medications.   Review of Systems: As presented in the history of presenting illness, rest negative.  Past Medical History  Diagnosis Date  . Premature ventricular contractions   . Coronary atherosclerosis of native coronary artery     prior DES to the marginal of the LCX in January of 2012  . HLD (hyperlipidemia)   . HTN (hypertension)   . Hiatal hernia   . Hypothyroidism   . Osteoporosis   . Palpitations   . Heart murmur   . Anginal pain   . Exertional shortness of breath   . History of blood transfusion     "after daughter was born" (05/22/2013)  . Sinus headache   . Recurrent UTI     "q 4 months for the last couple years; last one was 12/2012" (05/22/2013)  . Tremors of nervous system     by dr. love   Past Surgical History  Procedure Laterality Date  . Tonsillectomy    . Appendectomy    . Total abdominal hysterectomy    . Ectopic pregnancy surgery    . Breast  cyst excision Right 1965  . Breast lumpectomy Right 1980's    "benign" (05/22/2013)  . Breast cyst aspiration Bilateral     "numerous in the dr's office" (05/22/2013)  . Cardiac catheterization  ~ 1968; 08/2010  . Coronary angioplasty with stent placement  07/2010  . Laparotomy  1965    "ruptured blood vessel in my side during childbirth; didn't find it til 2 days later; had to open me up to repair it" (05/22/2013)  . Coronary angioplasty with stent placement  05/23/2013    Patent LCx stent (10-20% ISR), 70% mid RCA (FFR 0.73) s/p DES, 20% prox RCA, 40% ostial D1, 60% distal posterior AV branch; EF 65-70%  . Left heart catheterization with coronary angiogram N/A 05/23/2013    Procedure: LEFT HEART CATHETERIZATION WITH CORONARY ANGIOGRAM;  Surgeon: Wellington Hampshire, MD;  Location: [facility] CATH [facility];  Service: Cardiovascular;  Laterality: N/A;  . Left heart catheterization with coronary angiogram N/A 01/28/2014    Procedure: LEFT HEART CATHETERIZATION WITH CORONARY ANGIOGRAM;  Surgeon: Blane Ohara, MD;  Location: [facility] CATH LAB;  Service: Cardiovascular;  Laterality: N/A;   Social History:  reports that she has never smoked. She has never used smokeless tobacco. She reports that she does not drink alcohol or use illicit drugs. Where does patient live home. Can patient participate in ADLs? Yes.  Allergies  Allergen Reactions  . Flagyl [Metronidazole Hcl] Anaphylaxis  . Atorvastatin     REACTION: myaligia  . Codeine  Hives  . Prednisone     Makes my heart race  . Simvastatin     REACTION: myalgia  . Sulfa Antibiotics     Jittery  . Metronidazole Nausea And Vomiting and Rash  . Penicillins Nausea And Vomiting and Rash    Family History:  Family History  Problem Relation Age of Onset  . Heart attack Brother 40      Prior to Admission medications   Medication Sig Start Date End Date Taking? Authorizing Provider  ALPRAZolam Duanne Moron) 0.25 MG tablet As needed to help with sleep 01/31/14    Historical Provider, MD  aspirin (ASPIR-81) 81 MG EC tablet Take 81 mg by mouth daily.      Historical Provider, MD  Calcium Carbonate (CALTRATE 600) 1500 MG TABS Take by mouth 2 (two) times daily.     Historical Provider, MD  cephALEXin (KEFLEX) 500 MG capsule Take 500 mg by mouth 2 (two) times daily. 08/21/14   Historical Provider, MD  cholecalciferol (VITAMIN D) 1000 UNITS tablet Take 1,000 Units by mouth daily.    Historical Provider, MD  ciprofloxacin (CIPRO) 250 MG tablet Take 250 mg by mouth 2 (two) times daily as needed (for UTI).  07/12/13   Historical Provider, MD  esomeprazole (NEXIUM) 40 MG capsule Take 40 mg by mouth daily.      Historical Provider, MD  levothyroxine (SYNTHROID, LEVOTHROID) 50 MCG tablet Take 50 mcg by mouth daily before breakfast.    Historical Provider, MD  metoprolol tartrate (LOPRESSOR) 25 MG tablet Take 0.5 tablets (12.5 mg total) by mouth 2 (two) times daily. 01/14/15   Fay Records, MD  Multiple Vitamins-Minerals (MULTIVITAL) tablet Take 1 tablet by mouth daily.      Historical Provider, MD  nitroGLYCERIN (NITROSTAT) 0.4 MG SL tablet Place 0.4 mg under the tongue every 5 (five) minutes as needed for chest pain.    Historical Provider, MD  ondansetron (ZOFRAN ODT) 4 MG disintegrating tablet Take 1 tablet (4 mg total) by mouth every 8 (eight) hours as needed for nausea. 07/06/14   Tanna Furry, MD  potassium chloride SA (K-DUR,KLOR-CON) 20 MEQ tablet Take 1 tablet (20 mEq total) by mouth daily. 01/27/15   Nicole Pisciotta, PA-C  prasugrel (EFFIENT) 10 MG TABS tablet Take 1 tablet (10 mg total) by mouth daily. 01/14/15   Fay Records, MD  rosuvastatin (CRESTOR) 20 MG tablet TAKE 1 TABLET (20 MG TOTAL) BY MOUTH DAILY. 01/14/15   Fay Records, MD  valsartan-hydrochlorothiazide (DIOVAN HCT) 160-12.5 MG per tablet Take 1 tablet by mouth daily.      Historical Provider, MD    Physical Exam: Filed Vitals:   01/27/15 2200 01/27/15 2218 01/27/15 2319 01/27/15 2334  BP: 132/64    156/61  Pulse: 63 69  74  Temp:    98.4 F (36.9 C)  TempSrc:    Oral  Resp: 13 17  18   Height:   5\' 2"  (1.575 m)   Weight:   62.007 kg (136 lb 11.2 oz)   SpO2: 99% 98%  98%     General:  Moderately built and nourished.  Eyes: Anicteric no pallor.  ENT: No discharge from the ears eyes nose or mouth.  Neck: No mass felt. No JVD appreciated.  Cardiovascular: S1-S2 heard.  Respiratory: No rhonchi or crepitations.  Abdomen: Soft nontender bowel sounds present.  Skin: No rash.  Musculoskeletal: No edema.  Psychiatric: Appears normal.  Neurologic: Alert awake oriented to time place and person. Moves all  extremities.  Labs on Admission:  Basic Metabolic Panel:  Recent Labs Lab 01/27/15 1734 01/27/15 1753  NA 137  --   K 3.2*  --   CL 97*  --   CO2 30  --   GLUCOSE 129*  --   BUN 18  --   CREATININE 0.87  --   CALCIUM 9.1  --   MG  --  2.0   Liver Function Tests:  Recent Labs Lab 01/27/15 1734  AST 31  ALT 24  ALKPHOS 82  BILITOT 1.1  PROT 7.1  ALBUMIN 4.3   No results for input(s): LIPASE, AMYLASE in the last 168 hours. No results for input(s): AMMONIA in the last 168 hours. CBC:  Recent Labs Lab 01/27/15 1734  WBC 10.1  NEUTROABS 5.4  HGB 13.0  HCT 40.5  MCV 90.2  PLT 244   Cardiac Enzymes:  Recent Labs Lab 01/27/15 1752 01/27/15 2104  TROPONINI <0.03 <0.03    BNP (last 3 results) No results for input(s): BNP in the last 8760 hours.  ProBNP (last 3 results) No results for input(s): PROBNP in the last 8760 hours.  CBG: No results for input(s): GLUCAP in the last 168 hours.  Radiological Exams on Admission: Dg Chest 2 View  01/27/2015   CLINICAL DATA:  Acute onset of generalized weakness. Initial encounter.  EXAM: CHEST  2 VIEW  COMPARISON:  Chest radiograph performed 02/12/2014  FINDINGS: The lungs are well-aerated. Minimal scarring is noted at the right lung apex. There is no evidence of focal opacification, pleural effusion or  pneumothorax.  The heart is normal in size; the mediastinal contour is within normal limits. No acute osseous abnormalities are seen.  IMPRESSION: No acute cardiopulmonary process seen.   Electronically Signed   By: Garald Balding M.D.   On: 01/27/2015 20:54    EKG: Independently reviewed. Normal sinus rhythm with nonspecific ST changes in the anteroinferior leads.  Assessment/Plan Principal Problem:   Chest pain Active Problems:   Hypothyroidism   Essential hypertension   1. Chest pain - given history of CAD status post stenting we will cycle cardiac markers check 2-D echo continue antiplatelets agents and statins. Keep patient nothing by mouth in anticipation of possible procedure. Patient's last cardiac cath was last July 2015. Presently patient is chest pain-free. 2. Fatigue and weakness - cause not clear. Check CK levels and check orthostatics and gently hydrate as patient's metabolic panel shows low chloride levels. 3. Mild hypokalemia - replace and recheck. Follow magnesium levels. 4. Hypertension - continue home medications but hold HCTZ as patient is getting gentle hydration. 5. Hypothyroidism - continue Synthroid may have increase Synthroid dose as stasis is mildly elevated. 6. Hyperlipidemia on statins.   DVT Prophylaxis Lovenox.  Code Status: Full code.  Family Communication: Discussed patient.  Disposition Plan: Admit for observation.    Marilea Gwynne N. [facility] Pager [phone].  If 7PM-7AM, please contact night-coverage [email or web address] Password [email or web address] 01/28/2015, 1:47 AM

## 2015-01-28 NOTE — Progress Notes (Signed)
D/c instruction given to pt with medication script, follow up appointments also given Pt verbalized good understanding with a teach back. Condition at discharge is stable.

## 2015-01-28 NOTE — Progress Notes (Signed)
*  PRELIMINARY RESULTS* Echocardiogram 2D Echocardiogram has been performed.  Leavy Cella 01/28/2015, 9:41 AM

## 2015-01-28 NOTE — Discharge Summary (Signed)
Physician Discharge Summary  [name] AOZ:[id number] DOB: 07/17/1946 DOA: 01/27/2015  PCP:  Melinda Crutch, MD  Admit date: 01/27/2015 Discharge date: 01/28/2015  Time spent: 25 minutes  Recommendations for Outpatient Follow-up:  1. Follow up with Melinda Crutch, MD within one week. 2. Please check lytes at next visit 3. TSH minimally elevated-defer further adjustment of synthroid to PCP  Discharge Diagnoses:  Principal Problem:   Chest pain Active Problems:   Hypothyroidism   Essential hypertension   Discharge Condition: Patient is stable, chest pain resolved, weakness resolved.  Diet recommendation: Heart healthy diet.  Filed Weights   01/27/15 1729 01/27/15 2319 01/28/15 0548  Weight: 62.143 kg (137 lb) 62.007 kg (136 lb 11.2 oz) 61.644 kg (135 lb 14.4 oz)    History of present illness:  [name] is a 69 y.o. female with PMH significant for CAD status post stenting last cardiac cath in July 2015, hyperlipidemia and hypertension who presented to the ER with fatigue and weakness x 3 days that had gotten worse on 01/27/15. Denied any loss of consciousness, dizziness or shortness of breath. While in the ER patient started developing retrosternal chest tightness which radiated to both shoulders and lasted for around 1-2 minutes then resolve spontaneously. EKG was showing nonspecific status changes in the anteroinferior leads. Troponins were negative chest x-ray was unremarkable. Given the patient's history of CAD she was admitted to rule out ACS. Patient is presently asymptomatic. Patient denied any nausea, vomiting, abdominal pain, productive cough, fever, chills, or new medications.   Hospital Course:  Chest pain - given history of CAD status post stenting troponins were cycled and remained negative for duration. Cardiology was consulted. 2-D echo showed preserved EF.  Nuclear Stress test showed no ischemia. No further chest pain . Continued home antiplatelets and statins. Ok to  discharge per cards if stress test negative.  Mild hypokalemia -secondary to HCTZ-repelted-will discharge with K supplement. Please follow at next visit  Hypertension - Continued home medications   Hypothyroidism - Continued Synthroid,TSH minimally elevated-defer further adjustment to PCP  Hyperlipidemia - on statins.  Procedures:  2-D echocardiogram, Stress test  Consultations:  Cardiology  Discharge Exam: Filed Vitals:   01/28/15 1259  BP: 159/65  Pulse: 75  Temp: 98.5 F (36.9 C)  Resp: 20     General: Resting comfortably in bed, NAD.  Eyes: Anicteric sclera, PERRL.  ENT: No discharge from ears, eyes nose or mouth.  Neck: No thyromegaly. No JVD appreciated.  Cardiovascular: RRR, no m/r/g  Respiratory: CTAB, No rhonchi or wheezing  Abdomen: Soft nontender bowel sounds present. Approx 4 cm, non-tender, smooth mass felt 6 cm inferior to umbilicus.  Skin: No rash.  Musculoskeletal: No edema, ROM intact, particularly sensitive to touch on R anterior tibia when pressing for edema.  Neurologic: Alert awake oriented to time place and person. ROM  Discharge Instructions  Discharge Instructions    Diet - low sodium heart healthy    Complete by:  As directed      Increase activity slowly    Complete by:  As directed           Current Discharge Medication List    START taking these medications   Details  potassium chloride SA (K-DUR,KLOR-CON) 20 MEQ tablet Take 1 tablet (20 mEq total) by mouth daily. Qty: 30 tablet, Refills: 0      CONTINUE these medications which have CHANGED   Details  metoprolol tartrate (LOPRESSOR) 25 MG tablet Take 0.5 tablets (12.5 mg  total) by mouth 2 (two) times daily. Qty: 60 tablet, Refills: 0      CONTINUE these medications which have NOT CHANGED   Details  aspirin (ASPIR-81) 81 MG EC tablet Take 81 mg by mouth daily.      Calcium Carbonate (CALTRATE 600) 1500 MG TABS Take by mouth 2 (two) times daily.      cholecalciferol (VITAMIN D) 1000 UNITS tablet Take 1,000 Units by mouth daily.    esomeprazole (NEXIUM) 40 MG capsule Take 40 mg by mouth daily.      levothyroxine (SYNTHROID, LEVOTHROID) 50 MCG tablet Take 50 mcg by mouth daily before breakfast.    Multiple Vitamins-Minerals (MULTIVITAL) tablet Take 1 tablet by mouth daily.      prasugrel (EFFIENT) 10 MG TABS tablet Take 1 tablet (10 mg total) by mouth daily. Qty: 30 tablet, Refills: 5    rosuvastatin (CRESTOR) 20 MG tablet TAKE 1 TABLET (20 MG TOTAL) BY MOUTH DAILY. Qty: 90 tablet, Refills: 1    valsartan-hydrochlorothiazide (DIOVAN HCT) 160-12.5 MG per tablet Take 1 tablet by mouth daily.      nitroGLYCERIN (NITROSTAT) 0.4 MG SL tablet Place 0.4 mg under the tongue every 5 (five) minutes as needed for chest pain.      STOP taking these medications     ALPRAZolam (XANAX) 0.25 MG tablet      ondansetron (ZOFRAN ODT) 4 MG disintegrating tablet        Allergies  Allergen Reactions  . Flagyl [Metronidazole Hcl] Anaphylaxis  . Atorvastatin     REACTION: myaligia  . Codeine Hives  . Prednisone     Makes my heart race  . Simvastatin     REACTION: myalgia  . Sulfa Antibiotics     Jittery  . Metronidazole Nausea And Vomiting and Rash  . Penicillins Nausea And Vomiting and Rash   Follow-up Information    Follow up with  Melinda Crutch, MD. Schedule an appointment as soon as possible for a visit on 02/05/2015.   Specialty:  Family Medicine   Why:  Appt. @ 3;30pm please arrive @ 3:15pm   Contact information:    [place] [id number] [phone]       Follow up with Dorris Carnes, MD. Schedule an appointment as soon as possible for a visit on 02/17/2015.   Specialty:  Cardiology   Why:  Appt. @ 11;45 am   Contact information:   [address] [place] [address] [phone]        The results of significant diagnostics from this hospitalization (including imaging, microbiology,  ancillary and laboratory) are listed below for reference.    Significant Diagnostic Studies: Dg Chest 2 View  01/27/2015   CLINICAL DATA:  Acute onset of generalized weakness. Initial encounter.  EXAM: CHEST  2 VIEW  COMPARISON:  Chest radiograph performed 02/12/2014  FINDINGS: The lungs are well-aerated. Minimal scarring is noted at the right lung apex. There is no evidence of focal opacification, pleural effusion or pneumothorax.  The heart is normal in size; the mediastinal contour is within normal limits. No acute osseous abnormalities are seen.  IMPRESSION: No acute cardiopulmonary process seen.   Electronically Signed   By: Garald Balding M.D.   On: 01/27/2015 20:54   Nm Myocar Multi W/spect W/wall Motion / Ef  01/28/2015    Horizontal ST segment depression ST segment depression was noted during  stress in the inferior leads (II and aVF), beginning at 1 minutes of  stress,  ending at 16 minutes of stress.  No T wave inversion was noted during stress.  The left ventricular ejection fraction is hyperdynamic (>65%).  The study is normal.  This is a low risk study.  Nuclear stress EF: 75%.     Microbiology: No results found for this or any previous visit (from the past 240 hour(s)).   Labs: Basic Metabolic Panel:  Recent Labs Lab 01/27/15 1734 01/27/15 1753 01/28/15 0215  NA 137  --   --   K 3.2*  --   --   CL 97*  --   --   CO2 30  --   --   GLUCOSE 129*  --   --   BUN 18  --   --   CREATININE 0.87  --  0.86  CALCIUM 9.1  --   --   MG  --  2.0  --    Liver Function Tests:  Recent Labs Lab 01/27/15 1734  AST 31  ALT 24  ALKPHOS 82  BILITOT 1.1  PROT 7.1  ALBUMIN 4.3   CBC:  Recent Labs Lab 01/27/15 1734 01/28/15 0215  WBC 10.1 8.6  NEUTROABS 5.4  --   HGB 13.0 11.9*  HCT 40.5 36.2  MCV 90.2 87.4  PLT 244 203   Cardiac Enzymes:  Recent Labs Lab 01/27/15 1752 01/27/15 2104 01/28/15 0215 01/28/15 0908 01/28/15 1320  CKTOTAL  --   --  64  --   --    TROPONINI <0.03 <0.03 <0.03 <0.03 <0.03     Signed:  Emily Filbert PA-S  Attending MD note  Patient was seen, examined,treatment plan was discussed with the PA-S. I have personally reviewed the clinical findings, lab, imaging studies and management of this patient in detail. I agree with the documentation, as recorded PA-S  No further chest pain-trop negative. Echo shows preserved EF, Nuc Stress neg-stable for discharge.  Oren Binet MD  [facility] 01/28/2015, 5:07 PM

## 2015-01-28 NOTE — Consult Note (Signed)
CARDIOLOGY CONSULT NOTE   Patient ID: [name] MRN: [id number], DOB/AGE: 11/08/1945   Admit date: 01/27/2015 Date of Consult: 01/28/2015   Primary Physician:  Melinda Crutch, MD Primary Cardiologist: Dorris Carnes MD  Pt. Profile  69 year old woman with prior history of known coronary artery disease admitted with weakness and chest pain.  Problem List  Past Medical History  Diagnosis Date  . Premature ventricular contractions   . Coronary atherosclerosis of native coronary artery     prior DES to the marginal of the LCX in January of 2012  . HLD (hyperlipidemia)   . HTN (hypertension)   . Hiatal hernia   . Hypothyroidism   . Osteoporosis   . Palpitations   . Heart murmur   . Anginal pain   . Exertional shortness of breath   . History of blood transfusion     "after daughter was born" (05/22/2013)  . Sinus headache   . Recurrent UTI     "q 4 months for the last couple years; last one was 12/2012" (05/22/2013)  . Tremors of nervous system     by dr. love    Past Surgical History  Procedure Laterality Date  . Tonsillectomy    . Appendectomy    . Total abdominal hysterectomy    . Ectopic pregnancy surgery    . Breast cyst excision Right 1965  . Breast lumpectomy Right 1980's    "benign" (05/22/2013)  . Breast cyst aspiration Bilateral     "numerous in the dr's office" (05/22/2013)  . Cardiac catheterization  ~ 1968; 08/2010  . Coronary angioplasty with stent placement  07/2010  . Laparotomy  1965    "ruptured blood vessel in my side during childbirth; didn't find it til 2 days later; had to open me up to repair it" (05/22/2013)  . Coronary angioplasty with stent placement  05/23/2013    Patent LCx stent (10-20% ISR), 70% mid RCA (FFR 0.73) s/p DES, 20% prox RCA, 40% ostial D1, 60% distal posterior AV branch; EF 65-70%  . Left heart catheterization with coronary angiogram N/A 05/23/2013    Procedure: LEFT HEART CATHETERIZATION WITH CORONARY ANGIOGRAM;  Surgeon:  Wellington Hampshire, MD;  Location: [place] CATH LAB;  Service: Cardiovascular;  Laterality: N/A;  . Left heart catheterization with coronary angiogram N/A 01/28/2014    Procedure: LEFT HEART CATHETERIZATION WITH CORONARY ANGIOGRAM;  Surgeon: Blane Ohara, MD;  [facility];  Service: Cardiovascular;  Laterality: N/A;     Allergies  Allergies  Allergen Reactions  . Flagyl [Metronidazole Hcl] Anaphylaxis  . Atorvastatin     REACTION: myaligia  . Codeine Hives  . Prednisone     Makes my heart race  . Simvastatin     REACTION: myalgia  . Sulfa Antibiotics     Jittery  . Metronidazole Nausea And Vomiting and Rash  . Penicillins Nausea And Vomiting and Rash    HPI   This 69 year old woman who was admitted last evening with chest pain has a history of known ischemic heart disease.  In 2012 she had a stent to her circumflex.  In October 2014She was admitted to [facility] with unstable angina. Cath showed Mild dz of LAD, patient stent to LCx with 10 to 20% instent restenosis. The RCA had a 60 to 70% mid lesion that had FFR of 0.73 She underwent PTCA/Xience stent placement to RCA [facility] was admitted in July 2015 with CP Underwent cardiac cathwhich demonstrated Left anterior descending (LAD): The LAD  is patent to the distal anterior wall. There is mild calcification with mild nonobstructive plaque noted in the proximal and mid vessel. Left circumflex (LCx): patent. The proximal circumflex was stented into the first obtuse marginal branch with very mild in-stent restenosis of no greater than 20%. Right coronary artery (RCA): The RCA is dominant. The stented segment in the mid vessel is widely patent with no significant in-stent restenosis. There is mild 50% stenosis involving the posterior AV segment. Left ventriculography: Left ventricular systolic function is normal, LVEF is estimated at 55-65%, there is no significant mitral regurgitation. There is heavy mitral annular calcification  present.  She was last seen in the office on 08/25/14 by Dr. Dorris Carnes at which time she was doing well.  Yesterday she felt unusually tired and weak.  She was concerned that she might have a urinary tract infection because urinary tract infections often present in her in that manner.  She went to [facility] where her urinalysis was unremarkable.  She was found to be mildly hypokalemic.  While she was at [facility] she began experiencing chest pressure.  EKG showed no acute changes but there were some nonspecific ST-T wave changes and she was referred to [facility] for further evaluation.  Since admission her troponins are negative 3.  Her hemoglobin is 11.9.  Her potassium was 3.2 at [facility] and she has been given potassium supplementation.  This morning she is pain-free and has no specific complaint. Her family history is strongly positive for heart disease.  Her father had congestive heart failure in her mother had coronary artery disease with heart attacks.  She has a younger brother who has had several heart attacks.  Inpatient Medications  . aspirin EC  81 mg Oral Daily  . cholecalciferol  1,000 Units Oral Daily  . enoxaparin (LOVENOX) injection  40 mg Subcutaneous Q24H  . irbesartan  150 mg Oral Daily   And  . hydrochlorothiazide  12.5 mg Oral Daily  . levothyroxine  50 mcg Oral QAC breakfast  . metoprolol tartrate  12.5 mg Oral BID  . pantoprazole  40 mg Oral Daily  . rosuvastatin  20 mg Oral q1800    Family History Family History  Problem Relation Age of Onset  . Heart attack Brother 42     Social History History   Social History  . Marital Status: Married    Spouse Name: N/A  . Number of Children: N/A  . Years of Education: N/A   Occupational History  . Retired Oceanographer    Social History Main Topics  . Smoking status: Never Smoker   . Smokeless tobacco: Never Used  . Alcohol Use: No  . Drug Use: No  . Sexual  Activity: Not Currently   Other Topics Concern  . Not on file   Social History Narrative     Review of Systems  General:  No chills, fever, night sweats or weight changes.  Cardiovascular:  No chest pain, dyspnea on exertion, edema, orthopnea, palpitations, paroxysmal nocturnal dyspnea. Dermatological: No rash, lesions/masses Respiratory: No cough, dyspnea Urologic: No hematuria, dysuria Abdominal:   No nausea, vomiting, diarrhea, bright red blood per rectum, melena, or hematemesis Neurologic:  No visual changes, wkns, changes in mental status. All other systems reviewed and are otherwise negative except as noted above.  Physical Exam  Blood pressure 145/53, pulse 65, temperature 97.9 F (36.6 C), temperature source Oral, resp. rate 20, height 5\' 2"  (  1.575 m), weight 135 lb 14.4 oz (61.644 kg), SpO2 96 %.  General: Pleasant, NAD Psych: Normal affect. Neuro: Alert and oriented X 3. Moves all extremities spontaneously. HEENT: Normal  Neck: Supple without bruits or JVD. Lungs:  Resp regular and unlabored, CTA. Heart: RRR no s3, s4.  There is a grade 1/6 systolic ejection murmur at the base. Abdomen: Soft, non-tender, non-distended, BS + x 4.  Extremities: No clubbing, cyanosis or edema. DP/PT/Radials 2+ and equal bilaterally.  Labs   Recent Labs  01/27/15 1752 01/27/15 2104 01/28/15 0215  CKTOTAL  --   --  64  TROPONINI <0.03 <0.03 <0.03   Lab Results  Component Value Date   WBC 8.6 01/28/2015   HGB 11.9* 01/28/2015   HCT 36.2 01/28/2015   MCV 87.4 01/28/2015   PLT 203 01/28/2015     Recent Labs Lab 01/27/15 1734 01/28/15 0215  NA 137  --   K 3.2*  --   CL 97*  --   CO2 30  --   BUN 18  --   CREATININE 0.87 0.86  CALCIUM 9.1  --   PROT 7.1  --   BILITOT 1.1  --   ALKPHOS 82  --   ALT 24  --   AST 31  --   GLUCOSE 129*  --    Lab Results  Component Value Date   CHOL 145 01/28/2014   HDL 51 01/28/2014   LDLCALC 62 01/28/2014   TRIG 161*  01/28/2014   Lab Results  Component Value Date   DDIMER  08/22/2010    0.23        AT THE INHOUSE ESTABLISHED CUTOFF VALUE OF 0.48 ug/mL FEU, THIS ASSAY HAS BEEN DOCUMENTED IN THE LITERATURE TO HAVE A SENSITIVITY AND NEGATIVE PREDICTIVE VALUE OF AT LEAST 98 TO 99%.  THE TEST RESULT SHOULD BE CORRELATED WITH AN ASSESSMENT OF THE CLINICAL PROBABILITY OF DVT / VTE.    Radiology/Studies  Dg Chest 2 View  01/27/2015   CLINICAL DATA:  Acute onset of generalized weakness. Initial encounter.  EXAM: CHEST  2 VIEW  COMPARISON:  Chest radiograph performed 02/12/2014  FINDINGS: The lungs are well-aerated. Minimal scarring is noted at the right lung apex. There is no evidence of focal opacification, pleural effusion or pneumothorax.  The heart is normal in size; the mediastinal contour is within normal limits. No acute osseous abnormalities are seen.  IMPRESSION: No acute cardiopulmonary process seen.   Electronically Signed   By: Garald Balding M.D.   On: 01/27/2015 20:54    ECG  -JUL-2016 20:57:28 [facility] ROUTINE RECORD Normal sinus rhythm with sinus arrhythmia Nonspecific ST abnormality Abnormal ECG subtle inferior lateral ST depression Confirmed by Wyvonnia Dusky MD, STEPHEN [phone]) on 01/27/2015 9:01:25 PM Personally reviewed. ASSESSMENT AND PLAN  1.  Chest pain of uncertain etiology 2.  Coronary artery disease with prior history of stents to circumflex and right coronary artery.  Most recent cardiac catheterization one year ago showed patent stents. 3.  Hypokalemia, treatment per primary team 4.  Hyperlipidemia 5.  Hypothyroidism  Plan: The patient will get an echocardiogram today.  We will also obtain a Lexiscan Myoview stress test.  Possible discharge later today if above studies are negative.  Berna Spare MD  01/28/2015, 8:10 AM

## 2015-01-28 NOTE — Progress Notes (Signed)
Pt returned to unit from stress test MD notified ,diet ordered and kitchen notified.  Pt c/o headache  Rated 5/10 on pain scale. Denied chest pain or SOB.Rn will continue to monitor and notify MD if headache persist . Pt was given tylenol 650 mg at nuclear medicine about an hr ago.

## 2015-01-28 NOTE — Progress Notes (Addendum)
Lexi MV performed. 1 day study, [place] to read. ECG w/ inf ST depression, MD to review. SBP elevated, will give rx (held for test).   Rosaria Ferries, Hershal Coria 01/28/2015 11:51 AM Beeper [phone]

## 2015-02-04 DIAGNOSIS — D649 Anemia, unspecified: Secondary | ICD-10-CM | POA: Diagnosis not present

## 2015-02-04 DIAGNOSIS — I10 Essential (primary) hypertension: Secondary | ICD-10-CM | POA: Diagnosis not present

## 2015-02-04 DIAGNOSIS — E780 Pure hypercholesterolemia: Secondary | ICD-10-CM | POA: Diagnosis not present

## 2015-02-04 DIAGNOSIS — E039 Hypothyroidism, unspecified: Secondary | ICD-10-CM | POA: Diagnosis not present

## 2015-02-05 DIAGNOSIS — E876 Hypokalemia: Secondary | ICD-10-CM | POA: Diagnosis not present

## 2015-02-05 DIAGNOSIS — I10 Essential (primary) hypertension: Secondary | ICD-10-CM | POA: Diagnosis not present

## 2015-02-13 DIAGNOSIS — E559 Vitamin D deficiency, unspecified: Secondary | ICD-10-CM | POA: Diagnosis not present

## 2015-02-13 DIAGNOSIS — F419 Anxiety disorder, unspecified: Secondary | ICD-10-CM | POA: Diagnosis not present

## 2015-02-13 DIAGNOSIS — I10 Essential (primary) hypertension: Secondary | ICD-10-CM | POA: Diagnosis not present

## 2015-02-13 DIAGNOSIS — K219 Gastro-esophageal reflux disease without esophagitis: Secondary | ICD-10-CM | POA: Diagnosis not present

## 2015-02-13 DIAGNOSIS — J329 Chronic sinusitis, unspecified: Secondary | ICD-10-CM | POA: Diagnosis not present

## 2015-02-13 DIAGNOSIS — I25110 Atherosclerotic heart disease of native coronary artery with unstable angina pectoris: Secondary | ICD-10-CM | POA: Diagnosis not present

## 2015-02-13 DIAGNOSIS — Z0000 Encounter for general adult medical examination without abnormal findings: Secondary | ICD-10-CM | POA: Diagnosis not present

## 2015-02-13 DIAGNOSIS — E039 Hypothyroidism, unspecified: Secondary | ICD-10-CM | POA: Diagnosis not present

## 2015-02-17 ENCOUNTER — Ambulatory Visit (INDEPENDENT_AMBULATORY_CARE_PROVIDER_SITE_OTHER): Payer: Medicare Other | Admitting: Internal Medicine

## 2015-02-17 ENCOUNTER — Encounter: Payer: Self-pay | Admitting: Internal Medicine

## 2015-02-17 VITALS — BP 132/84 | HR 75 | Ht 62.0 in | Wt 139.4 lb

## 2015-02-17 DIAGNOSIS — R079 Chest pain, unspecified: Secondary | ICD-10-CM

## 2015-02-17 DIAGNOSIS — I2510 Atherosclerotic heart disease of native coronary artery without angina pectoris: Secondary | ICD-10-CM

## 2015-02-17 LAB — BASIC METABOLIC PANEL
BUN: 14 mg/dL (ref 6–23)
CO2: 31 meq/L (ref 19–32)
Calcium: 9.5 mg/dL (ref 8.4–10.5)
Chloride: 102 mEq/L (ref 96–112)
Creatinine, Ser: 0.79 mg/dL (ref 0.40–1.20)
GFR: 76.33 mL/min (ref >60.00)
Glucose, Bld: 121 mg/dL — ABNORMAL HIGH (ref 70–99)
POTASSIUM: 3.6 meq/L (ref 3.5–5.1)
Sodium: 140 mEq/L (ref 135–145)

## 2015-02-17 MED ORDER — NITROGLYCERIN 0.4 MG SL SUBL: 0.4000 mg | SUBLINGUAL_TABLET | SUBLINGUAL | Status: DC | PRN

## 2015-02-17 MED ORDER — METOPROLOL TARTRATE 25 MG PO TABS: 12.5000 mg | ORAL_TABLET | Freq: Two times a day (BID) | ORAL | Status: DC

## 2015-02-17 MED ORDER — PRASUGREL HCL 10 MG PO TABS
10.0000 mg | ORAL_TABLET | Freq: Every day | ORAL | Status: DC
Start: 2015-02-17 — End: 2015-07-11

## 2015-02-17 MED ORDER — ROSUVASTATIN CALCIUM 20 MG PO TABS: 20.0000 mg | ORAL_TABLET | Freq: Every day | ORAL | Status: DC

## 2015-02-17 NOTE — Patient Instructions (Addendum)
Medication Instructions:   Your physician recommends that you continue on your current medications as directed. Please refer to the Current Medication list given to you today.    Labwork:  BMET   Testing/Procedures:   Follow-Up:  Your physician wants you to follow-up in: [place] will receive a reminder letter in the mail two months in advance. If you don't receive a letter, please call our office to schedule the follow-up appointment.   Any Other Special Instructions Will Be Listed Below (If Applicable).

## 2015-02-17 NOTE — Progress Notes (Signed)
Cardiology Office Note   Date:  02/17/2015   ID:  [name], DOB 1945-12-21, MRN [id number]  PCP:   Melinda Crutch, MD  Cardiologist:   Dorris Carnes, MD   No chief complaint on file. Pt presents for f.u of CAD   History of Present Illness: [name] is a 69 y.o. female with a history of CAD and palpitations. She was previously followed by [facility] was admitted in July 2015 with CP Underewent cardiac cath This showed Left anterior descending (LAD): The LAD is patent to the distal anterior wall. There is mild calcification with mild nonobstructive plaque noted in the proximal and mid vessel. Left circumflex (LCx): patent. The proximal circumflex was stented into the first obtuse marginal branch with very mild in-stent restenosis of no greater than 20%. Right coronary artery (RCA): The RCA is dominant. The stented segment in the mid vessel is widely patent with no significant in-stent restenosis. There is mild 50% stenosis involving the posterior AV segment. Left ventriculography: Left ventricular systolic function is normal, LVEF is estimated at 55-65%, there is no significant mitral regurgitation. There is heavy mitral annular calcification present.  SInce I saw her she denies CP  Breathing is OK      Current Outpatient Prescriptions  Medication Sig Dispense Refill  . aspirin (ASPIR-81) 81 MG EC tablet Take 81 mg by mouth daily.      . Calcium Carbonate (CALTRATE 600) 1500 MG TABS Take by mouth 2 (two) times daily.     . cephALEXin (KEFLEX) 250 MG capsule TAKE ONE CAPSULE BY MOUTH 4 TIMES A DAY  0  . cholecalciferol (VITAMIN D) 1000 UNITS tablet Take 1,000 Units by mouth daily.    Marland Kitchen esomeprazole (NEXIUM) 40 MG capsule Take 40 mg by mouth daily.      Marland Kitchen levothyroxine (SYNTHROID, LEVOTHROID) 50 MCG tablet Take 50 mcg by mouth daily before breakfast.    . metoprolol tartrate (LOPRESSOR) 25 MG tablet Take 0.5 tablets (12.5 mg total) by mouth 2 (two) times daily. 60 tablet 0   . Multiple Vitamins-Minerals (MULTIVITAL) tablet Take 1 tablet by mouth daily.      . nitroGLYCERIN (NITROSTAT) 0.4 MG SL tablet Place 0.4 mg under the tongue every 5 (five) minutes as needed for chest pain.    . potassium chloride SA (K-DUR,KLOR-CON) 20 MEQ tablet Take 1 tablet (20 mEq total) by mouth daily. 30 tablet 0  . prasugrel (EFFIENT) 10 MG TABS tablet Take 1 tablet (10 mg total) by mouth daily. 30 tablet 5  . rosuvastatin (CRESTOR) 20 MG tablet TAKE 1 TABLET (20 MG TOTAL) BY MOUTH DAILY. (Patient taking differently: Take 20 mg by mouth at bedtime. ) 90 tablet 1  . valsartan-hydrochlorothiazide (DIOVAN HCT) 160-12.5 MG per tablet Take 1 tablet by mouth daily.       No current facility-administered medications for this visit.    Allergies:   Flagyl; Atorvastatin; Codeine; Prednisone; Simvastatin; Sulfa antibiotics; Metronidazole; and Penicillins   Past Medical History  Diagnosis Date  . Premature ventricular contractions   . Coronary atherosclerosis of native coronary artery     prior DES to the marginal of the LCX in January of 2012  . HLD (hyperlipidemia)   . HTN (hypertension)   . Hiatal hernia   . Hypothyroidism   . Osteoporosis   . Palpitations   . Heart murmur   . Anginal pain   . Exertional shortness of breath   . History of blood transfusion     "  after daughter was born" (05/22/2013)  . Sinus headache   . Recurrent UTI     "q 4 months for the last couple years; last one was 12/2012" (05/22/2013)  . Tremors of nervous system     by dr. love    Past Surgical History  Procedure Laterality Date  . Tonsillectomy    . Appendectomy    . Total abdominal hysterectomy    . Ectopic pregnancy surgery    . Breast cyst excision Right 1965  . Breast lumpectomy Right 1980's    "benign" (05/22/2013)  . Breast cyst aspiration Bilateral     "numerous in the dr's office" (05/22/2013)  . Cardiac catheterization  ~ 1968; 08/2010  . Coronary angioplasty with stent placement   07/2010  . Laparotomy  1965    "ruptured blood vessel in my side during childbirth; didn't find it til 2 days later; had to open me up to repair it" (05/22/2013)  . Coronary angioplasty with stent placement  05/23/2013    Patent LCx stent (10-20% ISR), 70% mid RCA (FFR 0.73) s/p DES, 20% prox RCA, 40% ostial D1, 60% distal posterior AV branch; EF 65-70%  . Left heart catheterization with coronary angiogram N/A 05/23/2013    Procedure: LEFT HEART CATHETERIZATION WITH CORONARY ANGIOGRAM;  Surgeon: Wellington Hampshire, MD;  Location: [place] CATH LAB;  Service: Cardiovascular;  Laterality: N/A;  . Left heart catheterization with coronary angiogram N/A 01/28/2014    Procedure: LEFT HEART CATHETERIZATION WITH CORONARY ANGIOGRAM;  Surgeon: Blane Ohara, MD;  Location: [facility] CATH LAB;  Service: Cardiovascular;  Laterality: N/A;     Social History:  The patient  reports that she has never smoked. She has never used smokeless tobacco. She reports that she does not drink alcohol or use illicit drugs.   Family History:  The patient's family history includes Heart attack (age of onset: 25) in her brother.    ROS:  Please see the history of present illness. All other systems are reviewed and  Negative to the above problem except as noted.    PHYSICAL EXAM: VS:  BP 132/84 mmHg  Pulse 75  Ht 5\' 2"  (1.575 m)  Wt 139 lb 6.4 oz (63.231 kg)  BMI 25.49 kg/m2  GEN: Well nourished, well developed, in no acute distress HEENT: normal Neck: no JVD, carotid bruits, or masses Cardiac: RRR; no murmurs, rubs, or gallops,no edema  Respiratory:  clear to auscultation bilaterally, normal work of breathing GI: soft, nontender, nondistended, + BS  No hepatomegaly  MS: no deformity Moving all extremities   Skin: warm and dry, no rash Neuro:  Strength and sensation are intact Psych: euthymic mood, full affect   EKG:  EKG is ordered today.  SR 75 bpm     Lipid Panel    Component Value Date/Time   CHOL 145 01/28/2014  0323   TRIG 161* 01/28/2014 0323   HDL 51 01/28/2014 0323   CHOLHDL 2.8 01/28/2014 0323   VLDL 32 01/28/2014 0323   LDLCALC 62 01/28/2014 0323   LDLDIRECT 121.5 09/28/2010 0914      Wt Readings from Last 3 Encounters:  02/17/15 139 lb 6.4 oz (63.231 kg)  01/28/15 135 lb 14.4 oz (61.644 kg)  08/26/14 137 lb 12.8 oz (62.506 kg)      ASSESSMENT AND PLAN:  1.  CAD  I am not convinced on any active sympotms  I would continue to follow on current medical regimen    2  HL  Contiue crestor  3  HTN  Adequate control  Keep on current regimen   Disposition:   FU with  Me in 8 months    Signed, Dorris Carnes, MD  02/17/2015 1:49 PM    [facility] [address], [place], [place]  [address] Phone: [phone]; Fax: [phone]

## 2015-02-18 ENCOUNTER — Encounter: Payer: Self-pay | Admitting: Internal Medicine

## 2015-02-20 ENCOUNTER — Other Ambulatory Visit: Payer: Self-pay

## 2015-02-20 DIAGNOSIS — E876 Hypokalemia: Secondary | ICD-10-CM

## 2015-03-03 ENCOUNTER — Other Ambulatory Visit (INDEPENDENT_AMBULATORY_CARE_PROVIDER_SITE_OTHER): Payer: Medicare Other | Admitting: *Deleted

## 2015-03-03 ENCOUNTER — Telehealth: Payer: Self-pay | Admitting: *Deleted

## 2015-03-03 DIAGNOSIS — E876 Hypokalemia: Secondary | ICD-10-CM | POA: Diagnosis not present

## 2015-03-03 LAB — BASIC METABOLIC PANEL
BUN: 15 mg/dL (ref 6–23)
CALCIUM: 9.4 mg/dL (ref 8.4–10.5)
CO2: 31 mEq/L (ref 19–32)
CREATININE: 0.86 mg/dL (ref 0.40–1.20)
Chloride: 101 mEq/L (ref 96–112)
GFR: 69.2 mL/min (ref >60.00)
Glucose, Bld: 101 mg/dL — ABNORMAL HIGH (ref 70–99)
Potassium: 3.5 mEq/L (ref 3.5–5.1)
Sodium: 140 mEq/L (ref 135–145)

## 2015-03-03 NOTE — Telephone Encounter (Signed)
Patient came into the office and requested effient samples. Samples provided to patient.

## 2015-03-03 NOTE — Addendum Note (Signed)
Addended by: Eulis Foster on: 03/03/2015 10:43 AM   Modules accepted: Orders

## 2015-03-17 ENCOUNTER — Telehealth: Payer: Self-pay | Admitting: Internal Medicine

## 2015-03-17 NOTE — Telephone Encounter (Signed)
I spoke with the pt and made her aware of 03/03/15 BMP results.

## 2015-03-17 NOTE — Telephone Encounter (Signed)
New message ° ° ° ° ° °Returning a nurses call °

## 2015-05-05 DIAGNOSIS — Z23 Encounter for immunization: Secondary | ICD-10-CM | POA: Diagnosis not present

## 2015-06-04 ENCOUNTER — Telehealth: Payer: Self-pay | Admitting: Internal Medicine

## 2015-06-04 NOTE — Telephone Encounter (Signed)
New message     Patient calling the office for samples of medication:    1.  What medication and dosage are you requesting samples for? effient 10 mg   2.  Are you currently out of this medication?  Yes    Patient verbalize she in the doughnut hole.

## 2015-06-04 NOTE — Telephone Encounter (Signed)
Patient called requesting samples of Effient 10 mg. I returned her call stating that we have none to give her.

## 2015-07-08 ENCOUNTER — Telehealth: Payer: Self-pay | Admitting: Internal Medicine

## 2015-07-08 NOTE — Telephone Encounter (Signed)
New Message  Patient calling the office for samples of medication:   1.  What medication and dosage are you requesting samples for? Effient   2.  Are you currently out of this medication? Yes  Comments: Pt states that she is in the donut hole. Please assist

## 2015-07-11 ENCOUNTER — Ambulatory Visit: Payer: Medicare Other | Admitting: Internal Medicine

## 2015-07-11 ENCOUNTER — Encounter: Payer: Self-pay | Admitting: Internal Medicine

## 2015-07-11 ENCOUNTER — Ambulatory Visit (INDEPENDENT_AMBULATORY_CARE_PROVIDER_SITE_OTHER): Payer: Medicare Other | Admitting: Internal Medicine

## 2015-07-11 VITALS — BP 128/76 | HR 68 | Ht 62.0 in | Wt 137.8 lb

## 2015-07-11 DIAGNOSIS — Z955 Presence of coronary angioplasty implant and graft: Secondary | ICD-10-CM

## 2015-07-11 DIAGNOSIS — I2510 Atherosclerotic heart disease of native coronary artery without angina pectoris: Secondary | ICD-10-CM | POA: Diagnosis not present

## 2015-07-11 DIAGNOSIS — I10 Essential (primary) hypertension: Secondary | ICD-10-CM

## 2015-07-11 DIAGNOSIS — Z1231 Encounter for screening mammogram for malignant neoplasm of breast: Secondary | ICD-10-CM | POA: Diagnosis not present

## 2015-07-11 DIAGNOSIS — E785 Hyperlipidemia, unspecified: Secondary | ICD-10-CM

## 2015-07-11 LAB — BASIC METABOLIC PANEL
BUN: 13 mg/dL (ref 7–25)
CO2: 28 mmol/L (ref 20–31)
Calcium: 9.2 mg/dL (ref 8.6–10.4)
Chloride: 102 mmol/L (ref 98–110)
Creat: 0.84 mg/dL (ref 0.60–0.93)
GLUCOSE: 88 mg/dL (ref 65–99)
Potassium: 3.5 mmol/L (ref 3.5–5.3)
SODIUM: 139 mmol/L (ref 135–146)

## 2015-07-11 LAB — CBC
HEMATOCRIT: 38.2 % (ref 36.0–46.0)
HEMOGLOBIN: 12.5 g/dL (ref 12.0–15.0)
MCH: 28.1 pg (ref 26.0–34.0)
MCHC: 32.7 g/dL (ref 30.0–36.0)
MCV: 85.8 fL (ref 78.0–100.0)
MPV: 9.5 fL (ref 8.6–12.4)
Platelets: 254 10*3/uL (ref 150–400)
RBC: 4.45 MIL/uL (ref 3.87–5.11)
RDW: 12.9 % (ref 11.5–15.5)
WBC: 8.6 10*3/uL (ref 4.0–10.5)

## 2015-07-11 LAB — LIPID PANEL
CHOL/HDL RATIO: 3.2 ratio (ref <=5.0)
CHOLESTEROL: 160 mg/dL (ref 125–200)
HDL: 50 mg/dL (ref >=46)
LDL Cholesterol: 69 mg/dL (ref <130)
Triglycerides: 205 mg/dL — ABNORMAL HIGH (ref <150)
VLDL: 41 mg/dL — ABNORMAL HIGH (ref <30)

## 2015-07-11 MED ORDER — CLOPIDOGREL BISULFATE 75 MG PO TABS: 75.0000 mg | ORAL_TABLET | Freq: Every day | ORAL | Status: DC

## 2015-07-11 MED ORDER — METOPROLOL TARTRATE 25 MG PO TABS: 12.5000 mg | ORAL_TABLET | Freq: Two times a day (BID) | ORAL | Status: DC

## 2015-07-11 MED ORDER — ROSUVASTATIN CALCIUM 20 MG PO TABS: 20.0000 mg | ORAL_TABLET | Freq: Every day | ORAL | Status: DC

## 2015-07-11 MED ORDER — VALSARTAN-HYDROCHLOROTHIAZIDE 160-12.5 MG PO TABS: 1.0000 | ORAL_TABLET | Freq: Every day | ORAL | Status: DC

## 2015-07-11 NOTE — Patient Instructions (Signed)
Your physician has recommended you make the following change in your medication:  1.) STOP EFFIENT 2.) START PLAVIX 75 MG ONCE DAILY  Your physician recommends that you return for lab work in: [place] (BMET, CBC, LIPIDS)  Your physician wants you to follow-up in: July, 2017 [place].  You will receive a reminder letter in the mail two months in advance. If you don't receive a letter, please call our office to schedule the follow-up appointment.

## 2015-07-11 NOTE — Progress Notes (Signed)
Cardiology Office Note   Date:  07/11/2015   ID:  [name], DOB [birth year], MRN [id number]  PCP:   Melinda Crutch, MD  Cardiologist:   Dorris Carnes, MD   F/U of CAD     History of Present Illness: [name] is a 69 y.o. female with a history of HTN and CAD  I saw her in July  She was previously followed by T Wall  Last cath done in 2015 for CP  It showd patent stent to RCA  50% posterior AVsegment  Minimal dz in LAD LCx (nondominant) with long patent stent.  10 to 20% instent restenosis.   LVEF normal   Since seen she denies CP  Breathing is OK        Current Outpatient Prescriptions  Medication Sig Dispense Refill  . aspirin (ASPIR-81) 81 MG EC tablet Take 81 mg by mouth daily.      . Calcium Carbonate (CALTRATE 600) 1500 MG TABS Take by mouth 2 (two) times daily.     . cephALEXin (KEFLEX) 250 MG capsule TAKE ONE CAPSULE BY MOUTH 4 TIMES A DAY  0  . cholecalciferol (VITAMIN D) 1000 UNITS tablet Take 1,000 Units by mouth daily.    Marland Kitchen esomeprazole (NEXIUM) 40 MG capsule Take 40 mg by mouth daily.      Marland Kitchen levothyroxine (SYNTHROID, LEVOTHROID) 50 MCG tablet Take 50 mcg by mouth daily before breakfast.    . metoprolol tartrate (LOPRESSOR) 25 MG tablet Take 0.5 tablets (12.5 mg total) by mouth 2 (two) times daily. 90 tablet 3  . Multiple Vitamins-Minerals (MULTIVITAL) tablet Take 1 tablet by mouth daily.      . nitroGLYCERIN (NITROSTAT) 0.4 MG SL tablet Place 1 tablet (0.4 mg total) under the tongue every 5 (five) minutes as needed for chest pain. 25 tablet 3  . prasugrel (EFFIENT) 10 MG TABS tablet Take 1 tablet (10 mg total) by mouth daily. 30 tablet 11  . rosuvastatin (CRESTOR) 20 MG tablet Take 1 tablet (20 mg total) by mouth at bedtime. 90 tablet 1  . valsartan-hydrochlorothiazide (DIOVAN HCT) 160-12.5 MG per tablet Take 1 tablet by mouth daily.       No current facility-administered medications for this visit.    Allergies:   Flagyl; Atorvastatin; Codeine; Prednisone;  Simvastatin; Sulfa antibiotics; Metronidazole; and Penicillins   Past Medical History  Diagnosis Date  . Premature ventricular contractions   . Coronary atherosclerosis of native coronary artery     prior DES to the marginal of the LCX in January of 2012  . HLD (hyperlipidemia)   . HTN (hypertension)   . Hiatal hernia   . Hypothyroidism   . Osteoporosis   . Palpitations   . Heart murmur   . Anginal pain ([place])   . Exertional shortness of breath   . History of blood transfusion     "after daughter was born" (05/22/2013)  . Sinus headache   . Recurrent UTI     "q 4 months for the last couple years; last one was 12/2012" (05/22/2013)  . Tremors of nervous system     by dr. love    Past Surgical History  Procedure Laterality Date  . Tonsillectomy    . Appendectomy    . Total abdominal hysterectomy    . Ectopic pregnancy surgery    . Breast cyst excision Right 1965  . Breast lumpectomy Right 1980's    "benign" (05/22/2013)  . Breast cyst aspiration Bilateral     "  numerous in the dr's office" (05/22/2013)  . Cardiac catheterization  ~ 1968; 08/2010  . Coronary angioplasty with stent placement  07/2010  . Laparotomy  1965    "ruptured blood vessel in my side during childbirth; didn't find it til 2 days later; had to open me up to repair it" (05/22/2013)  . Coronary angioplasty with stent placement  05/23/2013    Patent LCx stent (10-20% ISR), 70% mid RCA (FFR 0.73) s/p DES, 20% prox RCA, 40% ostial D1, 60% distal posterior AV branch; EF 65-70%  . Left heart catheterization with coronary angiogram N/A 05/23/2013    Procedure: LEFT HEART CATHETERIZATION WITH CORONARY ANGIOGRAM;  Surgeon: Wellington Hampshire, MD;  Location: [facility] CATH [facility];  Service: Cardiovascular;  Laterality: N/A;  . Left heart catheterization with coronary angiogram N/A 01/28/2014    Procedure: LEFT HEART CATHETERIZATION WITH CORONARY ANGIOGRAM;  Surgeon: Blane Ohara, MD;  Location: [facility] C[facility];  Service:  Cardiovascular;  Laterality: N/A;     Social History:  The patient  reports that she has never smoked. She has never used smokeless tobacco. She reports that she does not drink alcohol or use illicit drugs.   Family History:  The patient's family history includes Heart attack (age of onset: 48) in her brother.    ROS:  Please see the history of present illness. All other systems are reviewed and  Negative to the above problem except as noted.    PHYSICAL EXAM: VS:  BP 128/76 mmHg  Pulse 68  Ht 5\' 2"  (1.575 m)  Wt 62.506 kg (137 lb 12.8 oz)  BMI 25.20 kg/m2  SpO2 99%  GEN: Well nourished, well developed, in no acute distress HEENT: normal Neck: no JVD, carotid bruits, or masses Cardiac: RRR; no murmurs, rubs, or gallops,no edema  Respiratory:  clear to auscultation bilaterally, normal work of breathing GI: soft, nontender, nondistended, + BS  No hepatomegaly  MS: no deformity Moving all extremities   Skin: warm and dry, no rash Neuro:  Strength and sensation are intact Psych: euthymic mood, full affect   EKG:  EKG is not ordered today.   Lipid Panel    Component Value Date/Time   CHOL 145 01/28/2014 0323   TRIG 161* 01/28/2014 0323   HDL 51 01/28/2014 0323   CHOLHDL 2.8 01/28/2014 0323   VLDL 32 01/28/2014 0323   LDLCALC 62 01/28/2014 0323   LDLDIRECT 121.5 09/28/2010 0914      Wt Readings from Last 3 Encounters:  07/11/15 62.506 kg (137 lb 12.8 oz)  02/17/15 63.231 kg (139 lb 6.4 oz)  01/28/15 61.644 kg (135 lb 14.4 oz)      ASSESSMENT AND PLAN:  1  CAD  Denies angina. WOuld switch to plavix  Plan to d/c at next visit. Check CBC and BMET   2.  HL  Check lipids    F/U in July       Signed, Cinque Begley, MD  07/11/2015 8:46 AM    [address], [place], [place]  [address] Phone: [phone]; Fax: [phone]

## 2015-08-05 ENCOUNTER — Encounter: Payer: Self-pay | Admitting: Cardiology

## 2015-08-21 ENCOUNTER — Other Ambulatory Visit: Payer: Self-pay | Admitting: Internal Medicine

## 2015-09-11 ENCOUNTER — Encounter (HOSPITAL_BASED_OUTPATIENT_CLINIC_OR_DEPARTMENT_OTHER): Payer: Self-pay

## 2015-09-11 ENCOUNTER — Emergency Department (HOSPITAL_BASED_OUTPATIENT_CLINIC_OR_DEPARTMENT_OTHER)
Admission: EM | Admit: 2015-09-11 | Discharge: 2015-09-11 | Disposition: A | Discharge status: Home / Self Care | Payer: Medicare Other | Attending: Emergency Medicine | Admitting: Emergency Medicine

## 2015-09-11 ENCOUNTER — Emergency Department (HOSPITAL_BASED_OUTPATIENT_CLINIC_OR_DEPARTMENT_OTHER): Payer: Medicare Other

## 2015-09-11 DIAGNOSIS — Z79899 Other long term (current) drug therapy: Secondary | ICD-10-CM | POA: Diagnosis not present

## 2015-09-11 DIAGNOSIS — R1011 Right upper quadrant pain: Secondary | ICD-10-CM

## 2015-09-11 DIAGNOSIS — Z9889 Other specified postprocedural states: Secondary | ICD-10-CM | POA: Insufficient documentation

## 2015-09-11 DIAGNOSIS — Z7982 Long term (current) use of aspirin: Secondary | ICD-10-CM | POA: Insufficient documentation

## 2015-09-11 DIAGNOSIS — Z880 Allergy status to penicillin: Secondary | ICD-10-CM | POA: Diagnosis not present

## 2015-09-11 DIAGNOSIS — I10 Essential (primary) hypertension: Secondary | ICD-10-CM | POA: Insufficient documentation

## 2015-09-11 DIAGNOSIS — Z9861 Coronary angioplasty status: Secondary | ICD-10-CM | POA: Diagnosis not present

## 2015-09-11 DIAGNOSIS — Z87440 Personal history of urinary (tract) infections: Secondary | ICD-10-CM | POA: Insufficient documentation

## 2015-09-11 DIAGNOSIS — Z792 Long term (current) use of antibiotics: Secondary | ICD-10-CM | POA: Diagnosis not present

## 2015-09-11 DIAGNOSIS — I25119 Atherosclerotic heart disease of native coronary artery with unspecified angina pectoris: Secondary | ICD-10-CM | POA: Diagnosis not present

## 2015-09-11 DIAGNOSIS — R011 Cardiac murmur, unspecified: Secondary | ICD-10-CM | POA: Diagnosis not present

## 2015-09-11 DIAGNOSIS — K760 Fatty (change of) liver, not elsewhere classified: Secondary | ICD-10-CM | POA: Diagnosis not present

## 2015-09-11 LAB — URINE MICROSCOPIC-ADD ON

## 2015-09-11 LAB — URINALYSIS, ROUTINE W REFLEX MICROSCOPIC
BILIRUBIN URINE: NEGATIVE
Glucose, UA: NEGATIVE mg/dL
KETONES UR: NEGATIVE mg/dL
Nitrite: NEGATIVE
PH: 5 (ref 5.0–8.0)
PROTEIN: NEGATIVE mg/dL
Specific Gravity, Urine: 1.009 (ref 1.005–1.030)

## 2015-09-11 LAB — COMPREHENSIVE METABOLIC PANEL
ALBUMIN: 4.3 g/dL (ref 3.5–5.0)
ALK PHOS: 81 U/L (ref 38–126)
ALT: 24 U/L (ref 14–54)
ANION GAP: 9 (ref 5–15)
AST: 32 U/L (ref 15–41)
BUN: 15 mg/dL (ref 6–20)
CALCIUM: 9.1 mg/dL (ref 8.9–10.3)
CO2: 29 mmol/L (ref 22–32)
Chloride: 104 mmol/L (ref 101–111)
Creatinine, Ser: 0.7 mg/dL (ref 0.44–1.00)
GFR calc Af Amer: >60 mL/min (ref >60)
GFR calc non Af Amer: >60 mL/min (ref >60)
GLUCOSE: 124 mg/dL — AB (ref 65–99)
Potassium: 3.2 mmol/L — ABNORMAL LOW (ref 3.5–5.1)
SODIUM: 142 mmol/L (ref 135–145)
Total Bilirubin: 0.7 mg/dL (ref 0.3–1.2)
Total Protein: 7.1 g/dL (ref 6.5–8.1)

## 2015-09-11 LAB — CBC WITH DIFFERENTIAL/PLATELET
Basophils Absolute: 0 10*3/uL (ref 0.0–0.1)
Basophils Relative: 0 %
EOS PCT: 2 %
Eosinophils Absolute: 0.2 10*3/uL (ref 0.0–0.7)
HEMATOCRIT: 40.3 % (ref 36.0–46.0)
Hemoglobin: 12.7 g/dL (ref 12.0–15.0)
LYMPHS PCT: 20 %
Lymphs Abs: 1.8 10*3/uL (ref 0.7–4.0)
MCH: 28 pg (ref 26.0–34.0)
MCHC: 31.5 g/dL (ref 30.0–36.0)
MCV: 88.8 fL (ref 78.0–100.0)
MONO ABS: 0.5 10*3/uL (ref 0.1–1.0)
MONOS PCT: 6 %
NEUTROS ABS: 6.7 10*3/uL (ref 1.7–7.7)
Neutrophils Relative %: 72 %
Platelets: 255 10*3/uL (ref 150–400)
RBC: 4.54 MIL/uL (ref 3.87–5.11)
RDW: 13.4 % (ref 11.5–15.5)
WBC: 9.2 10*3/uL (ref 4.0–10.5)

## 2015-09-11 LAB — LIPASE, BLOOD: Lipase: 201 U/L — ABNORMAL HIGH (ref 11–51)

## 2015-09-11 MED ORDER — IOHEXOL 300 MG/ML  SOLN
100.0000 mL | Freq: Once | INTRAMUSCULAR | Status: AC | PRN
  Administered 2015-09-11: 100 mL via INTRAVENOUS

## 2015-09-11 MED ORDER — IOHEXOL 300 MG/ML  SOLN
25.0000 mL | Freq: Once | INTRAMUSCULAR | Status: AC | PRN
  Administered 2015-09-11: 25 mL via ORAL

## 2015-09-11 MED ORDER — TRAMADOL HCL 50 MG PO TABS: 50.0000 mg | ORAL_TABLET | Freq: Four times a day (QID) | ORAL | Status: DC | PRN

## 2015-09-11 MED ORDER — MORPHINE SULFATE (PF) 2 MG/ML IV SOLN
2.0000 mg | Freq: Once | INTRAVENOUS | Status: AC
  Administered 2015-09-11: 2 mg via INTRAVENOUS
  Filled 2015-09-11: qty 1

## 2015-09-11 MED ORDER — ONDANSETRON HCL 4 MG/2ML IJ SOLN
4.0000 mg | Freq: Once | INTRAMUSCULAR | Status: AC
  Administered 2015-09-11: 4 mg via INTRAVENOUS
  Filled 2015-09-11: qty 2

## 2015-09-11 MED ORDER — SODIUM CHLORIDE 0.9 % IV BOLUS (SEPSIS)
500.0000 mL | Freq: Once | INTRAVENOUS | Status: AC
  Administered 2015-09-11: 500 mL via INTRAVENOUS

## 2015-09-11 MED FILL — traMADol HCL 50 MG TABS: 50 | 2 days supply | Qty: 8 | Fill #0

## 2015-09-11 NOTE — Discharge Instructions (Signed)
Abdominal Pain, Adult Many things can cause abdominal pain. Usually, abdominal pain is not caused by a disease and will improve without treatment. It can often be observed and treated at home. Your health care provider will do a physical exam and possibly order blood tests and X-rays to help determine the seriousness of your pain. However, in many cases, more time must pass before a clear cause of the pain can be found. Before that point, your health care provider may not know if you need more testing or further treatment. HOME CARE INSTRUCTIONS Monitor your abdominal pain for any changes. The following actions may help to alleviate any discomfort you are experiencing:  Only take over-the-counter or prescription medicines as directed by your health care provider.  Do not take laxatives unless directed to do so by your health care provider.  Try a clear liquid diet (broth, tea, or water) as directed by your health care provider. Slowly move to a bland diet as tolerated. SEEK MEDICAL CARE IF:  You have unexplained abdominal pain.  You have abdominal pain associated with nausea or diarrhea.  You have pain when you urinate or have a bowel movement.  You experience abdominal pain that wakes you in the night.  You have abdominal pain that is worsened or improved by eating food.  You have abdominal pain that is worsened with eating fatty foods.  You have a fever. SEEK IMMEDIATE MEDICAL CARE IF:  Your pain does not go away within 2 hours.  You keep throwing up (vomiting).  Your pain is felt only in portions of the abdomen, such as the right side or the left lower portion of the abdomen.  You pass bloody or black tarry stools. MAKE SURE YOU:  Understand these instructions.  Will watch your condition.  Will get help right away if you are not doing well or get worse.   This information is not intended to replace advice given to you by your health care provider. Make sure you discuss  any questions you have with your health care provider.   Document Released: 04/21/2005 Document Revised: 04/02/2015 Document Reviewed: 03/21/2013 Elsevier Interactive Patient Education 2016 Elsevier Inc[employer]  Full Liquid Diet A full liquid diet may be used:   To help you transition from a clear liquid diet to a soft diet.   When your body is healing and can only tolerate foods that are easy to digest.  Before or after certain a procedure, test, or surgery (such as stomach or intestinal surgeries).   If you have trouble swallowing or chewing.  A full liquid diet includes fluids and foods that are liquid or will become liquid at room temperature. The full liquid diet gives you the proteins, fluids, salts, and minerals that you need for energy. If you continue this diet for more than 72 hours, talk to your health care provider about how many calories you need to consume. If you continue the diet for more than 5 days, talk to your health care provider about taking a multivitamin or a nutritional supplement. WHAT DO I NEED TO KNOW ABOUT A FULL LIQUID DIET?  You may have any liquid.  You may have any food that becomes a liquid at room temperature. The food is considered a liquid if it can be poured off a spoon at room temperature.  Drink one serving of citrus or vitamin C-enriched fruit juice daily. WHAT FOODS CAN I EAT? Grains Any grain food that can be pureed in soup (such as crackers,  pasta, and rice). Hot cereal (such as farina or oatmeal) that has been blended. Talk to your health care provider or dietitian about these foods. Vegetables Pulp-free tomato or vegetable juice. Vegetables pureed in soup.  Fruits Fruit juice, including nectars and juices with pulp. Meats and Other Protein Sources Eggs in custard, eggnog mix, and eggs used in ice cream or pudding. Strained meats, like in baby food, may be allowed. Consult your health care provider.  Dairy Milk and milk-based beverages,  including milk shakes and instant breakfast mixes. Smooth yogurt. Pureed cottage cheese. Avoid these foods if they are not well tolerated. Beverages All beverages, including liquid nutritional supplements. Ask your health care provider if you can have carbonated beverages. They may not be well tolerated. Condiments Iodized salt, pepper, spices, and flavorings. Cocoa powder. Vinegar, ketchup, yellow mustard, smooth sauces (such as hollandaise, cheese sauce, or white sauce), and soy sauce. Sweets and Desserts Custard, smooth pudding. Flavored gelatin. Tapioca, junket. Plain ice cream, sherbet, fruit ices. Frozen ice pops, frozen fudge pops, pudding pops, and other frozen bars with cream. Syrups, including chocolate syrup. Sugar, honey, jelly.  Fats and Oils Margarine, butter, cream, sour cream, and oils. Other Broth and cream soups. Strained, broth-based soups. The items listed above may not be a complete list of recommended foods or beverages. Contact your dietitian for more options.  WHAT FOODS CAN I NOT EAT? Grains All breads. Grains are not allowed unless they are pureed into soup. Vegetables Vegetables are not allowed unless they are juiced, or cooked and pureed into soup. Fruits Fruits are not allowed unless they are juiced. Meats and Other Protein Sources Any meat or fish. Cooked or raw eggs. Nut butters.  Dairy Cheese.  Condiments Stone ground mustards. Fats and Oils Fats that are coarse or chunky. Sweets and Desserts Ice cream or other frozen desserts that have any solids in them or on top, such as nuts, chocolate chips, and pieces of cookies. Cakes. Cookies. Candy. Others Soups with chunks or pieces in them. The items listed above may not be a complete list of foods and beverages to avoid. Contact your dietitian for more information.   This information is not intended to replace advice given to you by your health care provider. Make sure you discuss any questions you have  with your health care provider.   Document Released: 07/12/2005 Document Revised: 07/17/2013 Document Reviewed: 05/17/2013 Elsevier Interactive Patient Education [employer].

## 2015-09-11 NOTE — ED Notes (Signed)
RUQ pain x 3 days-denies n/v/d, urinary s/s-last BM today-NAD-steady gait

## 2015-09-11 NOTE — ED Notes (Signed)
Pt c/o RUQ pain for past few days, pain with palpation to RUQ during assessment.  Pt denies any n/v/d or urinary symptoms.

## 2015-09-11 NOTE — ED Provider Notes (Signed)
Physical Exam  BP 182/79 mmHg  Pulse 82  Temp(Src) 98.4 F (36.9 C) (Oral)  Resp 16  Ht 5\' 2"  (1.575 m)  Wt 61.236 kg  BMI 24.69 kg/m2  SpO2 98%  Physical Exam  Constitutional: She appears well-developed and well-nourished. No distress.  Obese Caucasian female appears to be in no acute distress.  HENT:  Head: Atraumatic.  Eyes: Conjunctivae are normal.  Neck: Neck supple.  Cardiovascular: Normal rate and regular rhythm.   Pulmonary/Chest: Effort normal and breath sounds normal.  Abdominal: Soft. She exhibits no distension. There is tenderness (Right upper quadrant tenderness without guarding or rebound tenderness. No overlying skin changes, no rash.).  Neurological: She is alert.  Skin: No rash noted.  Psychiatric: She has a normal mood and affect.  Nursing note and vitals reviewed.   ED Course  Procedures   Results for orders placed or performed during the hospital encounter of 09/11/15  Urinalysis, Routine w reflex microscopic (not at [facility])  Result Value Ref Range   Color, Urine YELLOW YELLOW   APPearance CLEAR CLEAR   Specific Gravity, Urine 1.009 1.005 - 1.030   pH 5.0 5.0 - 8.0   Glucose, UA NEGATIVE NEGATIVE mg/dL   Hgb urine dipstick SMALL (A) NEGATIVE   Bilirubin Urine NEGATIVE NEGATIVE   Ketones, ur NEGATIVE NEGATIVE mg/dL   Protein, ur NEGATIVE NEGATIVE mg/dL   Nitrite NEGATIVE NEGATIVE   Leukocytes, UA SMALL (A) NEGATIVE  Urine microscopic-add on  Result Value Ref Range   Squamous Epithelial / LPF 0-5 (A) NONE SEEN   WBC, UA 0-5 0 - 5 WBC/hpf   RBC / HPF 0-5 0 - 5 RBC/hpf   Bacteria, UA RARE (A) NONE SEEN   Urine-Other MUCOUS PRESENT   Comprehensive metabolic panel  Result Value Ref Range   Sodium 142 135 - 145 mmol/L   Potassium 3.2 (L) 3.5 - 5.1 mmol/L   Chloride 104 101 - 111 mmol/L   CO2 29 22 - 32 mmol/L   Glucose, Bld 124 (H) 65 - 99 mg/dL   BUN 15 6 - 20 mg/dL   Creatinine, Ser 0.70 0.44 - 1.00 mg/dL   Calcium 9.1 8.9 - 10.3 mg/dL   Total Protein 7.1 6.5 - 8.1 g/dL   Albumin 4.3 3.5 - 5.0 g/dL   AST 32 15 - 41 U/L   ALT 24 14 - 54 U/L   Alkaline Phosphatase 81 38 - 126 U/L   Total Bilirubin 0.7 0.3 - 1.2 mg/dL   GFR calc non Af Amer >60 >60 mL/min   GFR calc Af Amer >60 >60 mL/min   Anion gap 9 5 - 15  CBC with Differential  Result Value Ref Range   WBC 9.2 4.0 - 10.5 K/uL   RBC 4.54 3.87 - 5.11 MIL/uL   Hemoglobin 12.7 12.0 - 15.0 g/dL   HCT 40.3 36.0 - 46.0 %   MCV 88.8 78.0 - 100.0 fL   MCH 28.0 26.0 - 34.0 pg   MCHC 31.5 30.0 - 36.0 g/dL   RDW 13.4 11.5 - 15.5 %   Platelets 255 150 - 400 K/uL   Neutrophils Relative % 72 %   Neutro Abs 6.7 1.7 - 7.7 K/uL   Lymphocytes Relative 20 %   Lymphs Abs 1.8 0.7 - 4.0 K/uL   Monocytes Relative 6 %   Monocytes Absolute 0.5 0.1 - 1.0 K/uL   Eosinophils Relative 2 %   Eosinophils Absolute 0.2 0.0 - 0.7 K/uL   Basophils Relative  0 %   Basophils Absolute 0.0 0.0 - 0.1 K/uL  Lipase, blood  Result Value Ref Range   Lipase 201 (H) 11 - 51 U/L   Ct Abdomen Pelvis W Contrast  09/11/2015  CLINICAL DATA:  Right upper quadrant pain for 3 days EXAM: CT ABDOMEN AND PELVIS WITH CONTRAST TECHNIQUE: Multidetector CT imaging of the abdomen and pelvis was performed using the standard protocol following bolus administration of intravenous contrast. CONTRAST:  128mL OMNIPAQUE IOHEXOL 300 MG/ML SOLN, 12mL OMNIPAQUE IOHEXOL 300 MG/ML SOLN COMPARISON:  06/30/2006 FINDINGS: Lung bases are free of acute infiltrate or sizable effusion. The liver is mildly decreased in attenuation consistent with fatty infiltration. Multiple scattered cysts are noted throughout the right and left lobes. The spleen, gallbladder, adrenal glands and pancreas are within normal limits. The kidneys are well visualized bilaterally and show evidence of a left renal cyst. No obstructive changes are seen. The appendix is not visualized consistent with a prior surgical history. The colon demonstrates diffuse diverticular  change without evidence diverticulitis. Aortoiliac calcifications are seen without aneurysmal dilatation. The bladder is partially distended. The bony structures show no acute abnormality. IMPRESSION: Fatty liver. Status post appendectomy. No acute abnormality noted. Electronically Signed   By: Inez Catalina M.D.   On: 09/11/2015 16:35       MDM BP 182/79 mmHg  Pulse 82  Temp(Src) 98.4 F (36.9 C) (Oral)  Resp 16  Ht 5\' 2"  (1.575 m)  Wt 61.236 kg  BMI 24.69 kg/m2  SpO2 98% The patient was noted to be hypertensive today in the emergency department. I have spoken with the patient regarding hypertension and the need for improved management. I instructed the patient to followup with the Primary care doctor within 4 days to improve the management of the patient's hypertension. I also counseled the patient regarding the signs and symptoms which would require an emergent visit to an emergency department for hypertensive urgency and/or hypertensive emergency. The patient understood the need for improved hypertensive management.    4:02 PM Received sign out from Dr. Stark Jock at beginning of shift.  Pt presents with RUQ abd pain.  Has elevated lipase of 201.  Currently waiting for abd/pelvis CT scan for further evaluation.  If negative and pt's pain is controlled then DC with clear liquid, pain meds and outpt f/u with PCP.  5:10 PM Abdominal pelvic CT scan demonstrated fatty liver, status post appendectomy, but no acute abnormalities. On reexamination, patient does have reproducible pain to the right upper quadrant but a non-peritoneal abdomen and no signs of shingles of skin infection causing her symptoms. She denies having any fever, chills, chest pain, shortness of breath, productive cough to suggest pneumonia. I encouraged patient to try liquid diet for the next several days due to mildly elevated lipase. She will need to follow-up with her primary care provider for further evaluation. Return precaution  discussed. Tramadol prescribed for pain as needed.  Domenic Moras, PA-C 09/11/15 1724  Veryl Speak, MD 09/14/15 [phone]

## 2015-09-11 NOTE — ED Provider Notes (Signed)
CSN: [id number]     Arrival date & time 09/11/15  1227 History   First MD Initiated Contact with Patient 09/11/15 1350     Chief Complaint  Patient presents with  . Abdominal Pain     (Consider location/radiation/quality/duration/timing/severity/associated sxs/prior Treatment) HPI Comments: Patient is a 70 year old female with past medical history of hypertension, high cholesterol, and CAD with stent. She presents for evaluation of right upper quadrant abdominal pain which has worsened over the past 3 days. She denies any fevers or chills. She denies any vomiting or diarrhea. She denies constipation. She had a similar episode over 1 year ago. Workup at that time revealed a normal ultrasound and no cause has been found. She's been doing well up until 3 days ago.  Patient is a 70 y.o. female presenting with abdominal pain. The history is provided by the patient.  Abdominal Pain Pain location:  RUQ Pain quality: cramping   Pain radiates to:  Does not radiate Pain severity:  Moderate Onset quality:  Gradual Duration:  3 days Timing:  Constant Progression:  Worsening Chronicity:  New Relieved by:  Nothing Worsened by:  Nothing tried Ineffective treatments:  None tried Associated symptoms: no chills, no fever and no shortness of breath     Past Medical History  Diagnosis Date  . Premature ventricular contractions   . Coronary atherosclerosis of native coronary artery     prior DES to the marginal of the LCX in January of 2012  . HLD (hyperlipidemia)   . HTN (hypertension)   . Hiatal hernia   . Hypothyroidism   . Osteoporosis   . Palpitations   . Heart murmur   . Anginal pain ([place])   . Exertional shortness of breath   . History of blood transfusion     "after daughter was born" (05/22/2013)  . Sinus headache   . Recurrent UTI     "q 4 months for the last couple years; last one was 12/2012" (05/22/2013)  . Tremors of nervous system     by dr. love   Past Surgical History   Procedure Laterality Date  . Tonsillectomy    . Appendectomy    . Total abdominal hysterectomy    . Ectopic pregnancy surgery    . Breast cyst excision Right 1965  . Breast lumpectomy Right 1980's    "benign" (05/22/2013)  . Breast cyst aspiration Bilateral     "numerous in the dr's office" (05/22/2013)  . Cardiac catheterization  ~ 1968; 08/2010  . Coronary angioplasty with stent placement  07/2010  . Laparotomy  1965    "ruptured blood vessel in my side during childbirth; didn't find it til 2 days later; had to open me up to repair it" (05/22/2013)  . Coronary angioplasty with stent placement  05/23/2013    Patent LCx stent (10-20% ISR), 70% mid RCA (FFR 0.73) s/p DES, 20% prox RCA, 40% ostial D1, 60% distal posterior AV branch; EF 65-70%  . Left heart catheterization with coronary angiogram N/A 05/23/2013    Procedure: LEFT HEART CATHETERIZATION WITH CORONARY ANGIOGRAM;  Surgeon: Wellington Hampshire, MD;  Location: [facility] CATH LAB;  Service: Cardiovascular;  Laterality: N/A;  . Left heart catheterization with coronary angiogram N/A 01/28/2014    Procedure: LEFT HEART CATHETERIZATION WITH CORONARY ANGIOGRAM;  Surgeon: Blane Ohara, MD;  Location: [facility];  Service: Cardiovascular;  Laterality: N/A;   Family History  Problem Relation Age of Onset  . Heart attack Brother 35   Social History  Substance Use Topics  . Smoking status: Never Smoker   . Smokeless tobacco: Never Used  . Alcohol Use: No   OB History    No data available     Review of Systems  Constitutional: Negative for fever and chills.  Respiratory: Negative for shortness of breath.   Gastrointestinal: Positive for abdominal pain.  All other systems reviewed and are negative.     Allergies  Flagyl; Atorvastatin; Codeine; Prednisone; Simvastatin; Sulfa antibiotics; Metronidazole; and Penicillins  Home Medications   Prior to Admission medications   Medication Sig Start Date End Date Taking? Authorizing  Provider  aspirin (ASPIR-81) 81 MG EC tablet Take 81 mg by mouth daily.      Historical Provider, MD  Calcium Carbonate (CALTRATE 600) 1500 MG TABS Take by mouth 2 (two) times daily.     Historical Provider, MD  cephALEXin (KEFLEX) 250 MG capsule TAKE ONE CAPSULE BY MOUTH 4 TIMES A DAY 02/13/15   Historical Provider, MD  cholecalciferol (VITAMIN D) 1000 UNITS tablet Take 1,000 Units by mouth daily.    Historical Provider, MD  clopidogrel (PLAVIX) 75 MG tablet Take 1 tablet (75 mg total) by mouth daily. 07/11/15   Fay Records, MD  esomeprazole (NEXIUM) 40 MG capsule Take 40 mg by mouth daily.      Historical Provider, MD  levothyroxine (SYNTHROID, LEVOTHROID) 50 MCG tablet Take 50 mcg by mouth daily before breakfast.    Historical Provider, MD  metoprolol tartrate (LOPRESSOR) 25 MG tablet Take 0.5 tablets (12.5 mg total) by mouth 2 (two) times daily. 07/11/15   Fay Records, MD  Multiple Vitamins-Minerals (MULTIVITAL) tablet Take 1 tablet by mouth daily.      Historical Provider, MD  nitroGLYCERIN (NITROSTAT) 0.4 MG SL tablet Place 1 tablet (0.4 mg total) under the tongue every 5 (five) minutes x 3 doses as needed for chest pain. 08/21/15   Fay Records, MD  rosuvastatin (CRESTOR) 20 MG tablet Take 1 tablet (20 mg total) by mouth at bedtime. 07/11/15   Fay Records, MD  valsartan-hydrochlorothiazide (DIOVAN HCT) 160-12.5 MG tablet Take 1 tablet by mouth daily. 07/11/15   Fay Records, MD   BP 182/79 mmHg  Pulse 82  Temp(Src) 98.4 F (36.9 C) (Oral)  Resp 16  Ht 5\' 2"  (1.575 m)  Wt 135 lb (61.236 kg)  BMI 24.69 kg/m2  SpO2 98% Physical Exam  Constitutional: She is oriented to person, place, and time. She appears well-developed and well-nourished. No distress.  HENT:  Head: Normocephalic and atraumatic.  Neck: Normal range of motion. Neck supple.  Cardiovascular: Normal rate and regular rhythm.  Exam reveals no gallop and no friction rub.   No murmur heard. Pulmonary/Chest: Effort normal  and breath sounds normal. No respiratory distress. She has no wheezes.  Abdominal: Soft. Bowel sounds are normal. She exhibits no distension. There is tenderness. There is no rebound and no guarding.  There is tenderness to palpation in the right upper quadrant. There is no rebound and no guarding.  Musculoskeletal: Normal range of motion.  Neurological: She is alert and oriented to person, place, and time.  Skin: Skin is warm and dry. She is not diaphoretic.  Nursing note and vitals reviewed.   ED Course  Procedures (including critical care time) Labs Review Labs Reviewed  URINALYSIS, ROUTINE W REFLEX MICROSCOPIC (NOT AT [facility]) - Abnormal; Notable for the following:    Hgb urine dipstick SMALL (*)    Leukocytes, UA SMALL (*)  All other components within normal limits  URINE MICROSCOPIC-ADD ON - Abnormal; Notable for the following:    Squamous Epithelial / LPF 0-5 (*)    Bacteria, UA RARE (*)    All other components within normal limits  CBC WITH DIFFERENTIAL/PLATELET  COMPREHENSIVE METABOLIC PANEL  LIPASE, BLOOD    Imaging Review No results found. I have personally reviewed and evaluated these images and lab results as part of my medical decision-making.   EKG Interpretation None      MDM   Final diagnoses:  None    Patient presents with right upper quadrant abdominal pain. Her laboratories reveal a mild elevation of her lipase of 201, but LFTs and other studies are unremarkable. She will undergo a CT scan of the abdomen and pelvis to further delineate the cause of her pain. This revealed no obvious abnormality in the patient will be discharged with pain medication, clear liquids, and when necessary return.    Veryl Speak, MD 09/14/15 [phone]

## 2015-10-02 DIAGNOSIS — H2513 Age-related nuclear cataract, bilateral: Secondary | ICD-10-CM | POA: Diagnosis not present

## 2015-10-14 DIAGNOSIS — D649 Anemia, unspecified: Secondary | ICD-10-CM | POA: Diagnosis not present

## 2015-10-14 DIAGNOSIS — R350 Frequency of micturition: Secondary | ICD-10-CM | POA: Diagnosis not present

## 2015-10-14 DIAGNOSIS — R42 Dizziness and giddiness: Secondary | ICD-10-CM | POA: Diagnosis not present

## 2015-10-14 DIAGNOSIS — E876 Hypokalemia: Secondary | ICD-10-CM | POA: Diagnosis not present

## 2016-02-01 DIAGNOSIS — N390 Urinary tract infection, site not specified: Secondary | ICD-10-CM | POA: Diagnosis not present

## 2016-02-23 ENCOUNTER — Telehealth: Payer: Self-pay | Admitting: Internal Medicine

## 2016-02-25 ENCOUNTER — Observation Stay (HOSPITAL_BASED_OUTPATIENT_CLINIC_OR_DEPARTMENT_OTHER)
Admission: EM | Admit: 2016-02-25 | Discharge: 2016-02-27 | Disposition: A | Discharge status: Home / Self Care | Payer: Medicare Other | Attending: Internal Medicine | Admitting: Internal Medicine

## 2016-02-25 ENCOUNTER — Emergency Department (HOSPITAL_BASED_OUTPATIENT_CLINIC_OR_DEPARTMENT_OTHER): Payer: Medicare Other

## 2016-02-25 ENCOUNTER — Encounter (HOSPITAL_BASED_OUTPATIENT_CLINIC_OR_DEPARTMENT_OTHER): Payer: Self-pay

## 2016-02-25 DIAGNOSIS — Z9889 Other specified postprocedural states: Secondary | ICD-10-CM | POA: Insufficient documentation

## 2016-02-25 DIAGNOSIS — M810 Age-related osteoporosis without current pathological fracture: Secondary | ICD-10-CM | POA: Insufficient documentation

## 2016-02-25 DIAGNOSIS — R079 Chest pain, unspecified: Secondary | ICD-10-CM | POA: Diagnosis not present

## 2016-02-25 DIAGNOSIS — I2510 Atherosclerotic heart disease of native coronary artery without angina pectoris: Secondary | ICD-10-CM | POA: Diagnosis present

## 2016-02-25 DIAGNOSIS — R0789 Other chest pain: Principal | ICD-10-CM

## 2016-02-25 DIAGNOSIS — E785 Hyperlipidemia, unspecified: Secondary | ICD-10-CM | POA: Insufficient documentation

## 2016-02-25 DIAGNOSIS — Z880 Allergy status to penicillin: Secondary | ICD-10-CM | POA: Insufficient documentation

## 2016-02-25 DIAGNOSIS — R531 Weakness: Secondary | ICD-10-CM | POA: Diagnosis not present

## 2016-02-25 DIAGNOSIS — E039 Hypothyroidism, unspecified: Secondary | ICD-10-CM | POA: Diagnosis not present

## 2016-02-25 DIAGNOSIS — Z951 Presence of aortocoronary bypass graft: Secondary | ICD-10-CM | POA: Insufficient documentation

## 2016-02-25 DIAGNOSIS — I493 Ventricular premature depolarization: Secondary | ICD-10-CM | POA: Diagnosis not present

## 2016-02-25 DIAGNOSIS — Z885 Allergy status to narcotic agent status: Secondary | ICD-10-CM | POA: Diagnosis not present

## 2016-02-25 DIAGNOSIS — D649 Anemia, unspecified: Secondary | ICD-10-CM | POA: Diagnosis not present

## 2016-02-25 DIAGNOSIS — Z881 Allergy status to other antibiotic agents status: Secondary | ICD-10-CM | POA: Insufficient documentation

## 2016-02-25 DIAGNOSIS — Z87440 Personal history of urinary (tract) infections: Secondary | ICD-10-CM | POA: Diagnosis not present

## 2016-02-25 DIAGNOSIS — E559 Vitamin D deficiency, unspecified: Secondary | ICD-10-CM | POA: Diagnosis not present

## 2016-02-25 DIAGNOSIS — E876 Hypokalemia: Secondary | ICD-10-CM | POA: Diagnosis present

## 2016-02-25 DIAGNOSIS — Z888 Allergy status to other drugs, medicaments and biological substances status: Secondary | ICD-10-CM | POA: Insufficient documentation

## 2016-02-25 DIAGNOSIS — Z7902 Long term (current) use of antithrombotics/antiplatelets: Secondary | ICD-10-CM | POA: Diagnosis not present

## 2016-02-25 DIAGNOSIS — Z882 Allergy status to sulfonamides status: Secondary | ICD-10-CM | POA: Insufficient documentation

## 2016-02-25 DIAGNOSIS — I10 Essential (primary) hypertension: Secondary | ICD-10-CM | POA: Diagnosis present

## 2016-02-25 DIAGNOSIS — Z8249 Family history of ischemic heart disease and other diseases of the circulatory system: Secondary | ICD-10-CM | POA: Diagnosis not present

## 2016-02-25 DIAGNOSIS — I25119 Atherosclerotic heart disease of native coronary artery with unspecified angina pectoris: Secondary | ICD-10-CM

## 2016-02-25 DIAGNOSIS — Z7982 Long term (current) use of aspirin: Secondary | ICD-10-CM | POA: Diagnosis not present

## 2016-02-25 DIAGNOSIS — E7800 Pure hypercholesterolemia, unspecified: Secondary | ICD-10-CM | POA: Diagnosis not present

## 2016-02-25 LAB — URINALYSIS, ROUTINE W REFLEX MICROSCOPIC
BILIRUBIN URINE: NEGATIVE
GLUCOSE, UA: NEGATIVE mg/dL
HGB URINE DIPSTICK: NEGATIVE
Ketones, ur: NEGATIVE mg/dL
Nitrite: NEGATIVE
Protein, ur: NEGATIVE mg/dL
SPECIFIC GRAVITY, URINE: 1.012 (ref 1.005–1.030)
pH: 7.5 (ref 5.0–8.0)

## 2016-02-25 LAB — BASIC METABOLIC PANEL
Anion gap: 10 (ref 5–15)
BUN: 16 mg/dL (ref 6–20)
CALCIUM: 9.3 mg/dL (ref 8.9–10.3)
CHLORIDE: 103 mmol/L (ref 101–111)
CO2: 28 mmol/L (ref 22–32)
Creatinine, Ser: 0.8 mg/dL (ref 0.44–1.00)
GFR calc non Af Amer: >60 mL/min (ref >60)
Glucose, Bld: 121 mg/dL — ABNORMAL HIGH (ref 65–99)
Potassium: 3.2 mmol/L — ABNORMAL LOW (ref 3.5–5.1)
SODIUM: 141 mmol/L (ref 135–145)

## 2016-02-25 LAB — CBC WITH DIFFERENTIAL/PLATELET
BASOS PCT: 0 %
Basophils Absolute: 0 10*3/uL (ref 0.0–0.1)
EOS ABS: 0.2 10*3/uL (ref 0.0–0.7)
Eosinophils Relative: 2 %
HCT: 40.2 % (ref 36.0–46.0)
HEMOGLOBIN: 12.9 g/dL (ref 12.0–15.0)
Lymphocytes Relative: 21 %
Lymphs Abs: 2.1 10*3/uL (ref 0.7–4.0)
MCH: 28 pg (ref 26.0–34.0)
MCHC: 32.1 g/dL (ref 30.0–36.0)
MCV: 87.4 fL (ref 78.0–100.0)
Monocytes Absolute: 0.5 10*3/uL (ref 0.1–1.0)
Monocytes Relative: 4 %
NEUTROS PCT: 73 %
Neutro Abs: 7.3 10*3/uL (ref 1.7–7.7)
Platelets: 262 10*3/uL (ref 150–400)
RBC: 4.6 MIL/uL (ref 3.87–5.11)
RDW: 13.4 % (ref 11.5–15.5)
WBC: 10.2 10*3/uL (ref 4.0–10.5)

## 2016-02-25 LAB — URINE MICROSCOPIC-ADD ON

## 2016-02-25 LAB — TROPONIN I

## 2016-02-25 MED ORDER — IRBESARTAN 150 MG PO TABS
150.0000 mg | ORAL_TABLET | Freq: Every day | ORAL | Status: DC
  Administered 2016-02-26: 150 mg via ORAL
  Filled 2016-02-25: qty 1

## 2016-02-25 MED ORDER — CLOPIDOGREL BISULFATE 75 MG PO TABS: 75.0000 mg | ORAL_TABLET | Freq: Every day | ORAL | Status: DC

## 2016-02-25 MED ORDER — LEVOTHYROXINE SODIUM 50 MCG PO TABS
50.0000 ug | ORAL_TABLET | Freq: Every day | ORAL | Status: DC
  Administered 2016-02-26 – 2016-02-27 (×2): 50 ug via ORAL
  Filled 2016-02-25 (×2): qty 1

## 2016-02-25 MED ORDER — POTASSIUM CHLORIDE 10 MEQ/100ML IV SOLN
10.0000 meq | Freq: Once | INTRAVENOUS | Status: AC
  Administered 2016-02-25: 10 meq via INTRAVENOUS
  Filled 2016-02-25: qty 100

## 2016-02-25 MED ORDER — METOPROLOL TARTRATE 12.5 MG HALF TABLET
12.5000 mg | ORAL_TABLET | Freq: Two times a day (BID) | ORAL | Status: DC
  Administered 2016-02-26: 12.5 mg via ORAL
  Filled 2016-02-25: qty 1

## 2016-02-25 MED ORDER — ASPIRIN 81 MG PO CHEW
324.0000 mg | CHEWABLE_TABLET | Freq: Once | ORAL | Status: AC
  Administered 2016-02-25: 324 mg via ORAL

## 2016-02-25 MED ORDER — HYDROCHLOROTHIAZIDE 12.5 MG PO CAPS
12.5000 mg | ORAL_CAPSULE | Freq: Every day | ORAL | Status: DC
Start: 2016-02-26 — End: 2016-02-27
  Administered 2016-02-26: 12.5 mg via ORAL
  Filled 2016-02-25: qty 1

## 2016-02-25 MED ORDER — ALPRAZOLAM 0.25 MG PO TABS: 0.2500 mg | ORAL_TABLET | Freq: Two times a day (BID) | ORAL | Status: DC | PRN

## 2016-02-25 MED ORDER — POTASSIUM CHLORIDE 10 MEQ/100ML IV SOLN
10.0000 meq | INTRAVENOUS | Status: AC
  Filled 2016-02-25: qty 100

## 2016-02-25 MED ORDER — ENOXAPARIN SODIUM 40 MG/0.4ML ~~LOC~~ SOLN: 40.0000 mg | SUBCUTANEOUS | Status: DC

## 2016-02-25 MED ORDER — ASPIRIN 81 MG PO CHEW
CHEWABLE_TABLET | ORAL | Status: AC
  Administered 2016-02-25: 324 mg via ORAL
  Filled 2016-02-25: qty 4

## 2016-02-25 MED ORDER — ONDANSETRON HCL 4 MG/2ML IJ SOLN: 4.0000 mg | Freq: Four times a day (QID) | INTRAMUSCULAR | Status: DC | PRN

## 2016-02-25 MED ORDER — ACETAMINOPHEN 325 MG PO TABS
650.0000 mg | ORAL_TABLET | ORAL | Status: DC | PRN
  Administered 2016-02-26: 650 mg via ORAL

## 2016-02-25 MED ORDER — VALSARTAN-HYDROCHLOROTHIAZIDE 160-12.5 MG PO TABS: 1.0000 | ORAL_TABLET | Freq: Every day | ORAL | Status: DC

## 2016-02-25 MED ORDER — TRAMADOL HCL 50 MG PO TABS: 50.0000 mg | ORAL_TABLET | Freq: Four times a day (QID) | ORAL | Status: DC | PRN

## 2016-02-25 MED ORDER — ROSUVASTATIN CALCIUM 10 MG PO TABS
20.0000 mg | ORAL_TABLET | Freq: Every day | ORAL | Status: DC
  Administered 2016-02-26: 20 mg via ORAL
  Filled 2016-02-25: qty 2

## 2016-02-25 MED ORDER — SODIUM CHLORIDE 0.9 % IV BOLUS (SEPSIS)
1000.0000 mL | Freq: Once | INTRAVENOUS | Status: AC
  Administered 2016-02-25: 1000 mL via INTRAVENOUS

## 2016-02-25 MED ORDER — MORPHINE SULFATE (PF) 2 MG/ML IV SOLN: 2.0000 mg | INTRAVENOUS | Status: DC | PRN

## 2016-02-25 MED ORDER — PANTOPRAZOLE SODIUM 40 MG PO TBEC
40.0000 mg | DELAYED_RELEASE_TABLET | Freq: Every day | ORAL | Status: DC
  Administered 2016-02-26: 40 mg via ORAL
  Filled 2016-02-25: qty 1

## 2016-02-25 MED ORDER — ASPIRIN EC 81 MG PO TBEC
81.0000 mg | DELAYED_RELEASE_TABLET | Freq: Every day | ORAL | Status: DC
  Administered 2016-02-26: 81 mg via ORAL
  Filled 2016-02-25: qty 1

## 2016-02-25 NOTE — ED Notes (Signed)
Pt placed on auto vitals Q30. Patient placed on cardiac monitor.  

## 2016-02-25 NOTE — ED Provider Notes (Addendum)
[facility] Provider Note   CSN: [id number] Arrival date & time: 02/25/16  1306  First Provider Contact:  First MD Initiated Contact with Patient 02/25/16 1324        History   Chief Complaint Chief Complaint  Patient presents with  . Chest Pain    HPI [name] is a 70 y.o. female.  HPI   Patient is a 70 year old female with history of 3 stents, hypertension hyperlipidemia palpitations recurrent UTIs presenting today with chest tightness and weakness. Patient said that on Saturday she developed some chest tightness that disappeared after about a minute. Since then she's been feeling very weak, dry, fatigue. No urinary symptoms. No nausea vomiting or diarrhea. Additionally this morning she had chest tightness that radiated across her chest. She has had this multiple times over the last several days. Did not move her jaw or her arms. She said it field similar to the tightness that she had prior to her second stent placement.   Past Medical History:  Diagnosis Date  . Anginal pain ([place])   . Coronary atherosclerosis of native coronary artery    prior DES to the marginal of the LCX in January of 2012  . Exertional shortness of breath   . Heart murmur   . Hiatal hernia   . History of blood transfusion    "after daughter was born" (05/22/2013)  . HLD (hyperlipidemia)   . HTN (hypertension)   . Hypothyroidism   . Osteoporosis   . Palpitations   . Premature ventricular contractions   . Recurrent UTI    "q 4 months for the last couple years; last one was 12/2012" (05/22/2013)  . Sinus headache   . Tremors of nervous system    by dr. love    Patient Active Problem List   Diagnosis Date Noted  . Chest pain 01/27/2015  . Intermediate coronary syndrome ([place]) 01/27/2014  . Unstable angina ([place]) 05/24/2013  . S/P primary angioplasty with coronary stent 05/24/2013  . Hypokalemia 05/24/2013  . Hypothyroidism 08/14/2010  . Hyperlipidemia 08/14/2010  . Essential  hypertension 08/14/2010  . CAD, NATIVE VESSEL 08/14/2010  . PREMATURE VENTRICULAR CONTRACTIONS 08/14/2010  . HIATAL HERNIA 08/14/2010    Past Surgical History:  Procedure Laterality Date  . APPENDECTOMY    . BREAST CYST ASPIRATION Bilateral    "numerous in the dr's office" (05/22/2013)  . BREAST CYST EXCISION Right 1965  . BREAST LUMPECTOMY Right 1980's   "benign" (05/22/2013)  . CARDIAC CATHETERIZATION  ~ 1968; 08/2010  . CORONARY ANGIOPLASTY WITH STENT PLACEMENT  07/2010  . CORONARY ANGIOPLASTY WITH STENT PLACEMENT  05/23/2013   Patent LCx stent (10-20% ISR), 70% mid RCA (FFR 0.73) s/p DES, 20% prox RCA, 40% ostial D1, 60% distal posterior AV branch; EF 65-70%  . ECTOPIC PREGNANCY SURGERY    . LAPAROTOMY  1965   "ruptured blood vessel in my side during childbirth; didn't find it til 2 days later; had to open me up to repair it" (05/22/2013)  . LEFT HEART CATHETERIZATION WITH CORONARY ANGIOGRAM N/A 05/23/2013   Procedure: LEFT HEART CATHETERIZATION WITH CORONARY ANGIOGRAM;  Surgeon: Wellington Hampshire, MD;  Location: [facility] CATH LAB;  Service: Cardiovascular;  Laterality: N/A;  . LEFT HEART CATHETERIZATION WITH CORONARY ANGIOGRAM N/A 01/28/2014   Procedure: LEFT HEART CATHETERIZATION WITH CORONARY ANGIOGRAM;  Surgeon: Blane Ohara, MD;  Location: [facility] CATH LAB;  Service: Cardiovascular;  Laterality: N/A;  . TONSILLECTOMY    . TOTAL ABDOMINAL HYSTERECTOMY  OB History    No data available       Home Medications    Prior to Admission medications   Medication Sig Start Date End Date Taking? Authorizing Provider  aspirin (ASPIR-81) 81 MG EC tablet Take 81 mg by mouth daily.      Historical Provider, MD  Calcium Carbonate (CALTRATE 600) 1500 MG TABS Take by mouth 2 (two) times daily.     Historical Provider, MD  cephALEXin (KEFLEX) 250 MG capsule TAKE ONE CAPSULE BY MOUTH 4 TIMES A DAY 02/13/15   Historical Provider, MD  cholecalciferol (VITAMIN D) 1000 UNITS tablet Take 1,000 Units  by mouth daily.    Historical Provider, MD  clopidogrel (PLAVIX) 75 MG tablet Take 1 tablet (75 mg total) by mouth daily. 07/11/15   Fay Records, MD  esomeprazole (NEXIUM) 40 MG capsule Take 40 mg by mouth daily.      Historical Provider, MD  levothyroxine (SYNTHROID, LEVOTHROID) 50 MCG tablet Take 50 mcg by mouth daily before breakfast.    Historical Provider, MD  metoprolol tartrate (LOPRESSOR) 25 MG tablet Take 0.5 tablets (12.5 mg total) by mouth 2 (two) times daily. 07/11/15   Fay Records, MD  Multiple Vitamins-Minerals (MULTIVITAL) tablet Take 1 tablet by mouth daily.      Historical Provider, MD  nitroGLYCERIN (NITROSTAT) 0.4 MG SL tablet Place 1 tablet (0.4 mg total) under the tongue every 5 (five) minutes x 3 doses as needed for chest pain. 08/21/15   Fay Records, MD  rosuvastatin (CRESTOR) 20 MG tablet Take 1 tablet (20 mg total) by mouth at bedtime. 07/11/15   Fay Records, MD  traMADol (ULTRAM) 50 MG tablet Take 1 tablet (50 mg total) by mouth every 6 (six) hours as needed for severe pain. 09/11/15   Domenic Moras, PA-C  valsartan-hydrochlorothiazide (DIOVAN HCT) 160-12.5 MG tablet Take 1 tablet by mouth daily. 07/11/15   Fay Records, MD    Family History Family History  Problem Relation Age of Onset  . Heart attack Brother 63    Social History Social History  Substance Use Topics  . Smoking status: Never Smoker  . Smokeless tobacco: Never Used  . Alcohol use No     Allergies   Flagyl [metronidazole hcl]; Atorvastatin; Codeine; Prednisone; Simvastatin; Sulfa antibiotics; Metronidazole; and Penicillins   Review of Systems Review of Systems  Constitutional: Negative for activity change and fatigue.  HENT: Negative for congestion.   Eyes: Negative for discharge.  Respiratory: Negative for cough and chest tightness.   Cardiovascular: Positive for chest pain. Negative for leg swelling.  Gastrointestinal: Negative for abdominal distention.  Genitourinary: Negative for  difficulty urinating and dysuria.  Musculoskeletal: Negative for joint swelling.  Skin: Negative for rash.  Allergic/Immunologic: Negative for immunocompromised state.  Psychiatric/Behavioral: Negative for agitation and behavioral problems.  All other systems reviewed and are negative.    Physical Exam Updated Vital Signs BP 129/65   Pulse 81   Temp 99.3 F (37.4 C) (Oral)   Resp 14   Ht 5' 2" (1.575 m)   Wt 138 lb (62.6 kg)   SpO2 96%   BMI 25.24 kg/m   Physical Exam  Constitutional: She appears well-developed and well-nourished. No distress.  HENT:  Head: Normocephalic and atraumatic.  Eyes: Conjunctivae are normal.  Neck: Neck supple.  Cardiovascular: Normal rate and regular rhythm.   Murmur heard. Pulmonary/Chest: Effort normal and breath sounds normal. No respiratory distress.  Abdominal: Soft. There is no tenderness.  Musculoskeletal: Normal range of motion. She exhibits no edema.  Neurological: She is alert.  Skin: Skin is warm and dry.  Psychiatric: She has a normal mood and affect.  Nursing note and vitals reviewed.    ED Treatments / Results  Labs (all labs ordered are listed, but only abnormal results are displayed) Labs Reviewed  BASIC METABOLIC PANEL - Abnormal; Notable for the following:       Result Value   Potassium 3.2 (*)    Glucose, Bld 121 (*)    All other components within normal limits  URINALYSIS, ROUTINE W REFLEX MICROSCOPIC (NOT AT [facility]) - Abnormal; Notable for the following:    Leukocytes, UA SMALL (*)    All other components within normal limits  URINE MICROSCOPIC-ADD ON - Abnormal; Notable for the following:    Squamous Epithelial / LPF 0-5 (*)    Bacteria, UA RARE (*)    All other components within normal limits  CBC WITH DIFFERENTIAL/PLATELET  TROPONIN I    EKG  EKG Interpretation  Date/Time:  Wednesday February 25 2016 13:16:14 EDT Ventricular Rate:  89 PR Interval:    QRS Duration: 86 QT Interval:  382 QTC  Calculation: 465 R Axis:   37 Text Interpretation:  Sinus rhythm Multiple ventricular premature complexes Probable left atrial enlargement Borderline T abnormalities, anterior leads No significant change since last tracing Confirmed by Gerald Leitz ([id number]) on 02/25/2016 1:44:40 PM       Radiology Dg Chest 2 View  Result Date: 02/25/2016 CLINICAL DATA:  Chest pain. EXAM: CHEST  2 VIEW COMPARISON:  Radiographs of January 27, 2015. FINDINGS: The heart size and mediastinal contours are within normal limits. Both lungs are clear. No pneumothorax or pleural effusion is noted. The visualized skeletal structures are unremarkable. IMPRESSION: No active cardiopulmonary disease. Electronically Signed   By: Marijo Conception, M.D.   On: 02/25/2016 14:06    Procedures Procedures (including critical care time)  Medications Ordered in ED Medications  potassium chloride 10 mEq in 100 mL IVPB (not administered)  sodium chloride 0.9 % bolus 1,000 mL (not administered)  aspirin chewable tablet 324 mg (324 mg Oral Given 02/25/16 1344)     Initial Impression / Assessment and Plan / ED Course  I have reviewed the triage vital signs and the nursing notes.  Pertinent labs & imaging results that were available during my care of the patient were reviewed by me and considered in my medical decision making (see chart for details).  Clinical Course    Patient is a 70 year old female presenting with chest tightness multiple times in the last several days as well as weakness. Patient feels fatigued. Patient's had multiple UTIs in the past. We'll work patient up for fatigue including urinalysis, chest x-ray, be met. In addition we'll do a troponin, EKG.   Given patient's high heart score will likely have to admit for serial troponins.   3:31 PM Workup negative. High heart score will admit for serial troponins. Giving fluid and potassium.  Final Clinical Impressions(s) / ED Diagnoses   Final diagnoses:  None     New Prescriptions New Prescriptions   No medications on file     Shamonica Schadt Julio Alm, MD 02/25/16 [place], MD 02/25/16 1605

## 2016-02-25 NOTE — Progress Notes (Signed)
Pt coming from [facility] for CP r/o. HEART score 6. Accepted to [facility] telemetry bed for observation admission.  Linna Darner, MD [facility] 02/25/2016, 4:27 PM

## 2016-02-25 NOTE — H&P (Signed)
[name] [id number] DOB: 1945-12-20 DOA: 02/25/2016     PCP:  Melinda Crutch, MD   Outpatient Specialists: Cardiology Patient coming from:     home Lives  With family    Chief Complaint: Chest pain  HPI: [name] is a 70 y.o. female with medical history significant of  CAD, HTN, HLD    Presented with intermittent chest pain for the past 4 days she called her cardiologist but was told she hasn't been able to be seen until August. She continued to have some chest tightness and generalized fatigue. She feels tired all over. Any exertion was very tiring.  When chest pain initially started only lasted for a few minutes but now has been more frequent. She hadn't had any nausea vomiting or diarrhea this morning she tried to go to the store and her chest pain symptoms radiate across her chest did not go up to her jaw or arms. She took a nap but did not get any better.  She reports that this was similar to her prior chest pain when she required stenting. Patient is on Plavix and has been compliant     Regarding pertinent Chronic problems: History of coronary disease in 2014 cardiac catheterization showed mild disease of LAD, instent stenosis to LCx undergone stent to RCA Last echogram was 2016 last 60-65 percent grade 1 diastolic dysfunction.    IN ER: Afebrile heart rate 88 BP 166/92 WBC 10.2 Hg 12.9 K 3.2  Trop <0.03 CXR - unremarkable  Hospitalist was called for admission for chest pain evaluation  Review of Systems:    Pertinent positives include: chest pain, fatigue,  Constitutional:  No weight loss, night sweats, Fevers, chills,  weight loss  HEENT:  No headaches, Difficulty swallowing,Tooth/dental problems,Sore throat,  No sneezing, itching, ear ache, nasal congestion, post nasal drip,  Cardio-vascular:  No  Orthopnea, PND, anasarca, dizziness, palpitations.no Bilateral lower extremity swelling  GI:  No heartburn, indigestion, abdominal pain, nausea, vomiting,  diarrhea, change in bowel habits, loss of appetite, melena, blood in stool, hematemesis Resp:  no shortness of breath at rest. No dyspnea on exertion, No excess mucus, no productive cough, No non-productive cough, No coughing up of blood.No change in color of mucus.No wheezing. Skin:  no rash or lesions. No jaundice GU:  no dysuria, change in color of urine, no urgency or frequency. No straining to urinate.  No flank pain.  Musculoskeletal:  No joint pain or no joint swelling. No decreased range of motion. No back pain.  Psych:  No change in mood or affect. No depression or anxiety. No memory loss.  Neuro: no localizing neurological complaints, no tingling, no weakness, no double vision, no gait abnormality, no slurred speech, no confusion  As per HPI otherwise 10 point review of systems negative.   Past Medical History: Past Medical History:  Diagnosis Date  . Anginal pain ([place])   . Coronary atherosclerosis of native coronary artery    prior DES to the marginal of the LCX in January of 2012  . Exertional shortness of breath   . Heart murmur   . Hiatal hernia   . History of blood transfusion    "after daughter was born" (05/22/2013)  . HLD (hyperlipidemia)   . HTN (hypertension)   . Hypothyroidism   . Osteoporosis   . Palpitations   . Premature ventricular contractions   . Recurrent UTI    "q 4 months for the last couple years; last one was 12/2012" (  05/22/2013)  . Sinus headache   . Tremors of nervous system    by dr. love   Past Surgical History:  Procedure Laterality Date  . APPENDECTOMY    . BREAST CYST ASPIRATION Bilateral    "numerous in the dr's office" (05/22/2013)  . BREAST CYST EXCISION Right 1965  . BREAST LUMPECTOMY Right 1980's   "benign" (05/22/2013)  . CARDIAC CATHETERIZATION  ~ 1968; 08/2010  . CORONARY ANGIOPLASTY WITH STENT PLACEMENT  07/2010  . CORONARY ANGIOPLASTY WITH STENT PLACEMENT  05/23/2013   Patent LCx stent (10-20% ISR), 70% mid RCA (FFR  0.73) s/p DES, 20% prox RCA, 40% ostial D1, 60% distal posterior AV branch; EF 65-70%  . ECTOPIC PREGNANCY SURGERY    . LAPAROTOMY  1965   "ruptured blood vessel in my side during childbirth; didn't find it til 2 days later; had to open me up to repair it" (05/22/2013)  . LEFT HEART CATHETERIZATION WITH CORONARY ANGIOGRAM N/A 05/23/2013   Procedure: LEFT HEART CATHETERIZATION WITH CORONARY ANGIOGRAM;  Surgeon: Wellington Hampshire, MD;  Location: [facility]ATH [facility];  Service: Cardiovascular;  Laterality: N/A;  . LEFT HEART CATHETERIZATION WITH CORONARY ANGIOGRAM N/A 01/28/2014   Procedure: LEFT HEART CATHETERIZATION WITH CORONARY ANGIOGRAM;  Surgeon: Blane Ohara, MD;  Location: [facility] CATH LAB;  Service: Cardiovascular;  Laterality: N/A;  . TONSILLECTOMY    . TOTAL ABDOMINAL HYSTERECTOMY       Social History:  Ambulatory   independently      reports that she has never smoked. She has never used smokeless tobacco. She reports that she does not drink alcohol or use drugs.  Allergies:   Allergies  Allergen Reactions  . Flagyl [Metronidazole Hcl] Anaphylaxis  . Atorvastatin     REACTION: myaligia  . Codeine Hives  . Prednisone     Makes my heart race  . Simvastatin     REACTION: myalgia  . Sulfa Antibiotics     Jittery  . Metronidazole Nausea And Vomiting and Rash  . Penicillins Nausea And Vomiting and Rash       Family History:   Family History  Problem Relation Age of Onset  . Heart attack Brother 40    Medications: Prior to Admission medications   Medication Sig Start Date End Date Taking? Authorizing Provider  aspirin (ASPIR-81) 81 MG EC tablet Take 81 mg by mouth daily.      Historical Provider, MD  Calcium Carbonate (CALTRATE 600) 1500 MG TABS Take by mouth 2 (two) times daily.     Historical Provider, MD  cephALEXin (KEFLEX) 250 MG capsule TAKE ONE CAPSULE BY MOUTH 4 TIMES A DAY 02/13/15   Historical Provider, MD  cholecalciferol (VITAMIN D) 1000 UNITS tablet Take 1,000  Units by mouth daily.    Historical Provider, MD  clopidogrel (PLAVIX) 75 MG tablet Take 1 tablet (75 mg total) by mouth daily. 07/11/15   Fay Records, MD  esomeprazole (NEXIUM) 40 MG capsule Take 40 mg by mouth daily.      Historical Provider, MD  levothyroxine (SYNTHROID, LEVOTHROID) 50 MCG tablet Take 50 mcg by mouth daily before breakfast.    Historical Provider, MD  metoprolol tartrate (LOPRESSOR) 25 MG tablet Take 0.5 tablets (12.5 mg total) by mouth 2 (two) times daily. 07/11/15   Fay Records, MD  Multiple Vitamins-Minerals (MULTIVITAL) tablet Take 1 tablet by mouth daily.      Historical Provider, MD  nitroGLYCERIN (NITROSTAT) 0.4 MG SL tablet Place 1 tablet (0.4 mg total) under  the tongue every 5 (five) minutes x 3 doses as needed for chest pain. 08/21/15   Fay Records, MD  rosuvastatin (CRESTOR) 20 MG tablet Take 1 tablet (20 mg total) by mouth at bedtime. 07/11/15   Fay Records, MD  traMADol (ULTRAM) 50 MG tablet Take 1 tablet (50 mg total) by mouth every 6 (six) hours as needed for severe pain. 09/11/15   Domenic Moras, PA-C  valsartan-hydrochlorothiazide (DIOVAN HCT) 160-12.5 MG tablet Take 1 tablet by mouth daily. 07/11/15   Fay Records, MD    Physical Exam: Patient Vitals for the past 24 hrs:  BP Temp Temp src Pulse Resp SpO2 Height Weight  02/25/16 2010 (!) 120/57 98.2 F (36.8 C) Oral 73 16 97 % - -  02/25/16 1821 (!) 184/65 99.1 F (37.3 C) Oral 76 20 97 % - -  02/25/16 1700 130/65 - - 74 22 94 % - -  02/25/16 1630 143/68 - - - - - - -  02/25/16 1600 132/68 - - 70 15 98 % - -  02/25/16 1530 141/70 - - 81 17 98 % - -  02/25/16 1500 129/65 - - 81 14 96 % - -  02/25/16 1451 130/78 - - 81 - - - -  02/25/16 1330 164/94 - - 84 13 98 % - -  02/25/16 1316 166/92 99.3 F (37.4 C) Oral 88 20 97 % 5\' 2"  (1.575 m) 62.6 kg (138 lb)    1. General:  in No Acute distress 2. Psychological: Alert and  Oriented 3. Head/ENT:   Moist  Mucous Membranes                          Head Non  traumatic, neck supple                          Normal  Dentition 4. SKIN:   decreased Skin turgor,  Skin clean Dry and intact no rash 5. Heart: Regular rate and rhythm no  Murmur, Rub or gallop 6. Lungs:  Clear to auscultation bilaterally, no wheezes or crackles   7. Abdomen: Soft, non-tender, Non distended 8. Lower extremities: no clubbing, cyanosis, or edema 9. Neurologically Grossly intact, moving all 4 extremities equally 10. MSK: Normal range of motion   body mass index is 25.24 kg/m.  Labs on Admission:   Labs on Admission: I have personally reviewed following labs and imaging studies  CBC:  Recent Labs Lab 02/25/16 1330  WBC 10.2  NEUTROABS 7.3  HGB 12.9  HCT 40.2  MCV 87.4  PLT 99991111   Basic Metabolic Panel:  Recent Labs Lab 02/25/16 1330  NA 141  K 3.2*  CL 103  CO2 28  GLUCOSE 121*  BUN 16  CREATININE 0.80  CALCIUM 9.3   GFR: Estimated Creatinine Clearance: 56.1 mL/min (by C-G formula based on SCr of 0.8 mg/dL). Liver Function Tests: No results for input(s): AST, ALT, ALKPHOS, BILITOT, PROT, ALBUMIN in the last 168 hours. No results for input(s): LIPASE, AMYLASE in the last 168 hours. No results for input(s): AMMONIA in the last 168 hours. Coagulation Profile: No results for input(s): INR, PROTIME in the last 168 hours. Cardiac Enzymes:  Recent Labs Lab 02/25/16 1330 02/25/16 1859  TROPONINI <0.03 <0.03   BNP (last 3 results) No results for input(s): PROBNP in the last 8760 hours. HbA1C: No results for input(s): HGBA1C in the last 72 hours. CBG:  No results for input(s): GLUCAP in the last 168 hours. Lipid Profile: No results for input(s): CHOL, HDL, LDLCALC, TRIG, CHOLHDL, LDLDIRECT in the last 72 hours. Thyroid Function Tests: No results for input(s): TSH, T4TOTAL, FREET4, T3FREE, THYROIDAB in the last 72 hours. Anemia Panel: No results for input(s): VITAMINB12, FOLATE, FERRITIN, TIBC, IRON, RETICCTPCT in the last 72 hours. Urine  analysis:    Component Value Date/Time   COLORURINE YELLOW 02/25/2016 1400   APPEARANCEUR CLEAR 02/25/2016 1400   LABSPEC 1.012 02/25/2016 1400   PHURINE 7.5 02/25/2016 1400   GLUCOSEU NEGATIVE 02/25/2016 1400   HGBUR NEGATIVE 02/25/2016 1400   BILIRUBINUR NEGATIVE 02/25/2016 1400   KETONESUR NEGATIVE 02/25/2016 1400   PROTEINUR NEGATIVE 02/25/2016 1400   UROBILINOGEN 0.2 01/27/2015 1740   NITRITE NEGATIVE 02/25/2016 1400   LEUKOCYTESUR SMALL (A) 02/25/2016 1400   Sepsis Labs: @LABRCNTIP (procalcitonin:4,lacticidven:4) )No results found for this or any previous visit (from the past 240 hour(s)).     UA  no evidence of UTI  Lab Results  Component Value Date   HGBA1C 5.7 (H) 01/27/2014    Estimated Creatinine Clearance: 56.1 mL/min (by C-G formula based on SCr of 0.8 mg/dL).  BNP (last 3 results) No results for input(s): PROBNP in the last 8760 hours.   ECG REPORT  Independently reviewed Rate:89   Rhythm: Sinus rhythm with multiple PVCs ST&T Change: No acute ischemic changes  QTC 465  Filed Weights   02/25/16 1316  Weight: 62.6 kg (138 lb)     Cultures:    Component Value Date/Time   SDES URINE, CLEAN CATCH 02/12/2014 1352   SPECREQUEST NONE 02/12/2014 1352   CULT  02/12/2014 1352    KLEBSIELLA PNEUMONIAE ESCHERICHIA COLI Performed at [place] 02/16/2014 FINAL 02/12/2014 1352     Radiological Exams on Admission: Dg Chest 2 View  Result Date: 02/25/2016 CLINICAL DATA:  Chest pain. EXAM: CHEST  2 VIEW COMPARISON:  Radiographs of January 27, 2015. FINDINGS: The heart size and mediastinal contours are within normal limits. Both lungs are clear. No pneumothorax or pleural effusion is noted. The visualized skeletal structures are unremarkable. IMPRESSION: No active cardiopulmonary disease. Electronically Signed   By: Marijo Conception, M.D.   On: 02/25/2016 14:06    Chart has been reviewed    Assessment/Plan  70 y.o. female with medical  history significant of  CAD, HTN, HLD here for evaluation of chest pain currently chest pain-free  Present on Admission: . Chest pain, rule out acute myocardial infarction - - given risk factors will admit, monitor on telemetry, cycle cardiac enzymes, obtain serial ECG. Further risk stratify with lipid panel, hgA1C, obtain TSH. Make sure patient is on Aspirin And Plavix. Further treatment based on the currently pending results. Cardiology consult in AM, NPO for possible testing . CAD, NATIVE VESSEL - continue statin aspirin Plavix. Cardiology consult given chest pain worrisome for possible anginal pain . Essential hypertension continue home medications . Hypokalemia replace and check magnesium and phosphate level . Generalized fatigue - evaluate for cardiac etiology check for orthostasis no evidence of infection noted   Other plan as per orders.  DVT prophylaxis:    Lovenox     Code Status:  FULL CODE   as per patient   Family Communication:   Family   at  Bedside  plan of care was discussed with   Husband  Disposition Plan:   To home once workup is complete and patient is stable   Consults called:  emailed cardiology  Admission status:  obs    Level of care     tele           I have spent a total of 57 min on this admission   Emmanuelle Coxe 02/25/2016, 10:54 PM    [facility]  Pager [phone]   after 2 AM please page floor coverage PA If 7AM-7PM, please contact the day team taking care of the patient  [email or web address]  Password [email or web address]

## 2016-02-25 NOTE — ED Notes (Signed)
Patient transported to X-ray 

## 2016-02-25 NOTE — ED Triage Notes (Signed)
Intermittent CP since Saturday-states she called cardio and was advised unable to be seen until August-taken to tx area via w/c

## 2016-02-26 ENCOUNTER — Observation Stay (HOSPITAL_COMMUNITY): Payer: Medicare Other

## 2016-02-26 ENCOUNTER — Encounter (HOSPITAL_COMMUNITY): Payer: Self-pay | Admitting: Student

## 2016-02-26 ENCOUNTER — Observation Stay (HOSPITAL_BASED_OUTPATIENT_CLINIC_OR_DEPARTMENT_OTHER): Payer: Medicare Other

## 2016-02-26 ENCOUNTER — Other Ambulatory Visit (HOSPITAL_COMMUNITY): Payer: Medicare Other

## 2016-02-26 DIAGNOSIS — R0602 Shortness of breath: Secondary | ICD-10-CM | POA: Diagnosis not present

## 2016-02-26 DIAGNOSIS — R079 Chest pain, unspecified: Secondary | ICD-10-CM

## 2016-02-26 DIAGNOSIS — Z881 Allergy status to other antibiotic agents status: Secondary | ICD-10-CM | POA: Diagnosis not present

## 2016-02-26 DIAGNOSIS — E876 Hypokalemia: Secondary | ICD-10-CM

## 2016-02-26 DIAGNOSIS — Z880 Allergy status to penicillin: Secondary | ICD-10-CM | POA: Diagnosis not present

## 2016-02-26 DIAGNOSIS — Z885 Allergy status to narcotic agent status: Secondary | ICD-10-CM | POA: Diagnosis not present

## 2016-02-26 DIAGNOSIS — E785 Hyperlipidemia, unspecified: Secondary | ICD-10-CM | POA: Diagnosis not present

## 2016-02-26 DIAGNOSIS — I10 Essential (primary) hypertension: Secondary | ICD-10-CM

## 2016-02-26 DIAGNOSIS — Z87440 Personal history of urinary (tract) infections: Secondary | ICD-10-CM | POA: Diagnosis not present

## 2016-02-26 DIAGNOSIS — I2510 Atherosclerotic heart disease of native coronary artery without angina pectoris: Secondary | ICD-10-CM | POA: Diagnosis not present

## 2016-02-26 DIAGNOSIS — M810 Age-related osteoporosis without current pathological fracture: Secondary | ICD-10-CM | POA: Diagnosis not present

## 2016-02-26 DIAGNOSIS — I493 Ventricular premature depolarization: Secondary | ICD-10-CM | POA: Diagnosis not present

## 2016-02-26 DIAGNOSIS — R0789 Other chest pain: Secondary | ICD-10-CM | POA: Diagnosis not present

## 2016-02-26 DIAGNOSIS — Z8249 Family history of ischemic heart disease and other diseases of the circulatory system: Secondary | ICD-10-CM | POA: Diagnosis not present

## 2016-02-26 DIAGNOSIS — E039 Hypothyroidism, unspecified: Secondary | ICD-10-CM | POA: Diagnosis not present

## 2016-02-26 LAB — NM MYOCAR MULTI W/SPECT W/WALL MOTION / EF
CHL CUP MPHR: 149 {beats}/min
CHL CUP RESTING HR STRESS: 76 {beats}/min
CSEPED: 5 min
CSEPHR: 79 %
Estimated workload: 1 METS
Peak HR: 118 {beats}/min

## 2016-02-26 LAB — TROPONIN I: Troponin I: <0.03 ng/mL (ref <0.03)

## 2016-02-26 LAB — MAGNESIUM: Magnesium: 1.9 mg/dL (ref 1.7–2.4)

## 2016-02-26 LAB — POTASSIUM: POTASSIUM: 3.4 mmol/L — AB (ref 3.5–5.1)

## 2016-02-26 LAB — PHOSPHORUS: PHOSPHORUS: 4 mg/dL (ref 2.5–4.6)

## 2016-02-26 MED ORDER — SODIUM CHLORIDE 0.9 % IV SOLN
INTRAVENOUS | Status: AC
  Administered 2016-02-26: 21:00:00 via INTRAVENOUS

## 2016-02-26 MED ORDER — POTASSIUM CHLORIDE 10 MEQ/100ML IV SOLN
10.0000 meq | INTRAVENOUS | Status: AC
  Administered 2016-02-26 (×2): 10 meq via INTRAVENOUS
  Filled 2016-02-26: qty 100

## 2016-02-26 MED ORDER — TECHNETIUM TC 99M TETROFOSMIN IV KIT
10.0000 | PACK | Freq: Once | INTRAVENOUS | Status: AC | PRN
  Administered 2016-02-26: 10 via INTRAVENOUS

## 2016-02-26 MED ORDER — REGADENOSON 0.4 MG/5ML IV SOLN
0.4000 mg | Freq: Once | INTRAVENOUS | Status: AC
  Administered 2016-02-26: 0.4 mg via INTRAVENOUS

## 2016-02-26 MED ORDER — TECHNETIUM TC 99M TETROFOSMIN IV KIT
30.0000 | PACK | Freq: Once | INTRAVENOUS | Status: AC | PRN
  Administered 2016-02-26: 30 via INTRAVENOUS

## 2016-02-26 MED ORDER — POTASSIUM CHLORIDE CRYS ER 20 MEQ PO TBCR
40.0000 meq | EXTENDED_RELEASE_TABLET | Freq: Two times a day (BID) | ORAL | Status: DC
  Administered 2016-02-26: 40 meq via ORAL
  Filled 2016-02-26: qty 2

## 2016-02-26 MED ORDER — REGADENOSON 0.4 MG/5ML IV SOLN
INTRAVENOUS | Status: AC
  Filled 2016-02-26: qty 5

## 2016-02-26 MED ORDER — CLOPIDOGREL BISULFATE 75 MG PO TABS
75.0000 mg | ORAL_TABLET | Freq: Every day | ORAL | Status: DC
  Administered 2016-02-26: 75 mg via ORAL
  Filled 2016-02-26: qty 1

## 2016-02-26 MED ORDER — ACETAMINOPHEN 325 MG PO TABS
ORAL_TABLET | ORAL | Status: AC
  Filled 2016-02-26: qty 2

## 2016-02-26 NOTE — Progress Notes (Signed)
  Patient's Nuclear Stress Test results as below:  IMPRESSION: 1. No reversible ischemia or infarction.  2. Normal left ventricular wall motion.  3. Left ventricular ejection fraction 80%  4. Non invasive risk stratification*: Low  *2012 Appropriate Use Criteria for Coronary Revascularization Focused Update: J Am Coll Cardiol. 2012;59(9):857-881. Http://content.[email or web address].com.aspx?articleid=[email or web address]  Reviewed with the patient. Will make admitting team aware. Will arrange for outpatient Cardiology follow-up with Dr. Harrington Challenger. Stable for discharge from a Cardiology perspective.   Signed, Erma Heritage, PA-C 02/26/2016, 4:18 PM Pager: [phone]

## 2016-02-26 NOTE — Consult Note (Signed)
Cardiology Consult    Patient ID: [name] MRN: [id number], DOB/AGE: [birth year]   Admit date: 02/25/2016 Date of Consult: 02/26/2016  Primary Physician:  Melinda Crutch, MD Reason for Consult: Chest Pain Primary Cardiologist: Dr. Harrington Challenger Requesting Provider: Dr. Olevia Bowens   History of Present Illness    [name] is a 70 y.o. female with past medical history of CAD (s/p DES to LCx in 07/2010, DES to RCA in 04/2013), HTN, HLD, and hypothyroidism who presented to [facility] ED on 02/25/2016 for evaluation of chest pain.  Reports having two episodes of chest pain over the past 5 days, the first occurring on Saturday when she was outside with her granddaughter. She developed a slight pressure, lasting less than 1 minute which resolved spontaneously. No associated dyspnea, nausea, vomiting, or diaphoresis. She reports feeling weak over the past few days and thought she might be dehydrated due to her throat being dry. Her weakness persisted yesterday and she developed an episode of chest discomfort, again only lasting less than 1 minute. Says this pain feels very different from her previous angina at times she required a stent, for she had shooting pain across her chest and radiating into her neck. Denies any recurrence of this pain. She is not overly active at baseline but denies any anginal symptoms over the past few weeks when   While admitted, cyclic troponin values have been negative. CBC with WBC of 10.2, Hgb 12.9, platelets 262.BMET with K+ 3.2 (has been replaced) and creatinine 0.80. EKG shows NSR, HR 69, and with mild TWI in V1 and V2 (similar to previous tracings). CXR without any acute cardiopulmonary abnormalities.  She denies any current chest pain. Says since receiving IVF and K+, her weakness seems to have improved. Feels at baseline currently.  Last cardiac catheterization was in 01/2014 and showed 2 vessel CAD with continued patency of the stented segments in the left circumflex  and right coronary arteries. LCx was with less than 20% of in-stent restenosis and RCA without significant in-stent restenosis with mild 50% stenosis involving the posterior AV segment of the RCA. Normal LV function at that time.  She was seen again for CP in 01/2015 and has a NST performed at that time. Horizontal ST segment depression ST segment depression was noted during stress in the inferior leads (II and aVF), beginning at 1 minutes of stress, ending at 16 minutes of stress. Study was interpreted as normal and overall low-risk.    Past Medical History   Past Medical History:  Diagnosis Date  . Anginal pain ([place])   . Coronary atherosclerosis of native coronary artery    prior DES to the marginal of the LCX in January of 2012  . Exertional shortness of breath   . Heart murmur   . Hiatal hernia   . History of blood transfusion    "after daughter was born" (05/22/2013)  . HLD (hyperlipidemia)   . HTN (hypertension)   . Hypothyroidism   . Osteoporosis   . Palpitations   . Premature ventricular contractions   . Recurrent UTI    "q 4 months for the last couple years; last one was 12/2012" (05/22/2013)  . Sinus headache   . Tremors of nervous system    by dr. love    Past Surgical History:  Procedure Laterality Date  . APPENDECTOMY    . BREAST CYST ASPIRATION Bilateral    "numerous in the dr's office" (05/22/2013)  . BREAST CYST EXCISION Right 1965  .  BREAST LUMPECTOMY Right 1980's   "benign" (05/22/2013)  . CARDIAC CATHETERIZATION  ~ 1968; 08/2010  . CORONARY ANGIOPLASTY WITH STENT PLACEMENT  07/2010  . CORONARY ANGIOPLASTY WITH STENT PLACEMENT  05/23/2013   Patent LCx stent (10-20% ISR), 70% mid RCA (FFR 0.73) s/p DES, 20% prox RCA, 40% ostial D1, 60% distal posterior AV branch; EF 65-70%  . ECTOPIC PREGNANCY SURGERY    . LAPAROTOMY  1965   "ruptured blood vessel in my side during childbirth; didn't find it til 2 days later; had to open me up to repair it" (05/22/2013)  .  LEFT HEART CATHETERIZATION WITH CORONARY ANGIOGRAM N/A 05/23/2013   Procedure: LEFT HEART CATHETERIZATION WITH CORONARY ANGIOGRAM;  Surgeon: Wellington Hampshire, MD;  Location: [facility] CATH LAB;  Service: Cardiovascular;  Laterality: N/A;  . LEFT HEART CATHETERIZATION WITH CORONARY ANGIOGRAM N/A 01/28/2014   Procedure: LEFT HEART CATHETERIZATION WITH CORONARY ANGIOGRAM;  Surgeon: Blane Ohara, MD;  Location[facility] CATH LAB;  Service: Cardiovascular;  Laterality: N/A;  . TONSILLECTOMY    . TOTAL ABDOMINAL HYSTERECTOMY       Allergies  Allergies  Allergen Reactions  . Flagyl [Metronidazole Hcl] Anaphylaxis  . Atorvastatin     REACTION: myaligia  . Codeine Hives  . Prednisone     Makes my heart race  . Simvastatin     REACTION: myalgia  . Sulfa Antibiotics     Jittery  . Metronidazole Nausea And Vomiting and Rash  . Penicillins Nausea And Vomiting and Rash    Inpatient Medications    . aspirin EC  81 mg Oral Daily  . clopidogrel  75 mg Oral Daily  . enoxaparin (LOVENOX) injection  40 mg Subcutaneous Q24H  . hydrochlorothiazide  12.5 mg Oral Daily  . irbesartan  150 mg Oral Daily  . levothyroxine  50 mcg Oral QAC breakfast  . metoprolol tartrate  12.5 mg Oral BID  . pantoprazole  40 mg Oral Daily  . potassium chloride  40 mEq Oral BID  . rosuvastatin  20 mg Oral QHS    Family History    Family History  Problem Relation Age of Onset  . CAD Mother   . Lymphoma Mother   . Heart disease Father   . Heart attack Brother 30  . Stroke Other     Social History    Social History   Social History  . Marital status: Married    Spouse name: N/A  . Number of children: N/A  . Years of education: N/A   Occupational History  . Retired Oceanographer    Social History Main Topics  . Smoking status: Never Smoker  . Smokeless tobacco: Never Used  . Alcohol use No  . Drug use: No  . Sexual activity: Not on file   Other Topics Concern  . Not on file   Social History  Narrative  . No narrative on file     Review of Systems    General:  No chills, fever, night sweats or weight changes. Positive for generalized weakness. Cardiovascular:  No dyspnea on exertion, edema, orthopnea, palpitations, paroxysmal nocturnal dyspnea. Positive for chest pain.  Dermatological: No rash, lesions/masses Respiratory: No cough, dyspnea Urologic: No hematuria, dysuria Abdominal:   No nausea, vomiting, diarrhea, bright red blood per rectum, melena, or hematemesis Neurologic:  No visual changes, changes in mental status. All other systems reviewed and are otherwise negative except as noted above.  Physical Exam    Blood pressure (!) 148/63, pulse 65, temperature  97.5 F (36.4 C), temperature source Oral, resp. rate 18, height 5\' 2"  (1.575 m), weight 138 lb (62.6 kg), SpO2 97 %.  General: Pleasant, Caucasian female appearing in NAD Psych: Normal affect. Neuro: Alert and oriented X 3. Moves all extremities spontaneously. HEENT: Normal  Neck: Supple without bruits or JVD. Lungs:  Resp regular and unlabored, CTA without wheezing or rales. Heart: RRR no s3, s4, or murmurs. Abdomen: Soft, non-tender, non-distended, BS + x 4.  Extremities: No clubbing, cyanosis or edema. DP/PT/Radials 2+ and equal bilaterally.  Labs    Troponin (Point of Care Test) No results for input(s): TROPIPOC in the last 72 hours.  Recent Labs  02/25/16 1330 02/25/16 1859 02/26/16 0006 02/26/16 0607  TROPONINI <0.03 <0.03 <0.03 <0.03   Lab Results  Component Value Date   WBC 10.2 02/25/2016   HGB 12.9 02/25/2016   HCT 40.2 02/25/2016   MCV 87.4 02/25/2016   PLT 262 02/25/2016    Recent Labs Lab 02/25/16 1330  NA 141  K 3.2*  CL 103  CO2 28  BUN 16  CREATININE 0.80  CALCIUM 9.3  GLUCOSE 121*   Lab Results  Component Value Date   CHOL 160 07/11/2015   HDL 50 07/11/2015   LDLCALC 69 07/11/2015   TRIG 205 (H) 07/11/2015   Lab Results  Component Value Date   DDIMER   08/22/2010    0.23        AT THE INHOUSE ESTABLISHED CUTOFF VALUE OF 0.48 ug/mL FEU, THIS ASSAY HAS BEEN DOCUMENTED IN THE LITERATURE TO HAVE A SENSITIVITY AND NEGATIVE PREDICTIVE VALUE OF AT LEAST 98 TO 99%.  THE TEST RESULT SHOULD BE CORRELATED WITH AN ASSESSMENT OF THE CLINICAL PROBABILITY OF DVT / VTE.     Radiology Studies    Dg Chest 2 View: Result Date: 02/25/2016 CLINICAL DATA:  Chest pain. EXAM: CHEST  2 VIEW COMPARISON:  Radiographs of January 27, 2015. FINDINGS: The heart size and mediastinal contours are within normal limits. Both lungs are clear. No pneumothorax or pleural effusion is noted. The visualized skeletal structures are unremarkable. IMPRESSION: No active cardiopulmonary disease. Electronically Signed   By: Marijo Conception, M.D.   On: 02/25/2016 14:06    EKG & Cardiac Imaging    EKG:  NSR, HR 69, and with mild TWI in V1 and V2 (similar to previous tracings).   Echocardiogram: 01/28/2015 Study Conclusions  - Left ventricle: The cavity size was normal. Wall thickness was   increased in a pattern of mild LVH. Systolic function was normal.   The estimated ejection fraction was in the range of 60% to 65%.   Wall motion was normal; there were no regional wall motion   abnormalities. Doppler parameters are consistent with abnormal   left ventricular relaxation (grade 1 diastolic dysfunction). - Mitral valve: Calcified annulus. There was mild regurgitation. - Pulmonary arteries: PA peak pressure: 32 mm Hg (S).   Cardiac Catheterization: 01/2014 Procedural Findings: Hemodynamics: AO 119/55 LV 118/9  Coronary angiography: Coronary dominance: right  Left mainstem: The left mainstem is patent with no significant obstruction.  Left anterior descending (LAD): The LAD is patent to the distal anterior wall. There is mild calcification with mild nonobstructive plaque noted in the proximal and mid vessel. The diagonal branches are patent.  Left circumflex (LCx):  The left circumflex is patent. The proximal circumflex was stented into the first obtuse marginal branch with very mild in-stent restenosis of no greater than 20%. There is no high-grade stenosis  throughout the left circumflex distribution.  Right coronary artery (RCA): The RCA is dominant. The stented segment in the mid vessel is widely patent with no significant in-stent restenosis. The acute marginal branch has no obstructive disease. The distal RCA has no significant disease. There is mild 50% stenosis involving the posterior AV segment. The PLA branch is patent.  Left ventriculography: Left ventricular systolic function is normal, LVEF is estimated at 55-65%, there is no significant mitral regurgitation.  There is heavy mitral annular calcification present.  Final Conclusions:   1. 2 vessel coronary artery disease with continued patency of the stented segments in the left circumflex and right coronary arteries 2. Minor nonobstructive LAD stenosis 3. Normal LV systolic function  Recommendations: Suspect noncardiac chest pain. Will discharge home later this evening.   Assessment & Plan    1. Atypical Chest Pain - reports having 2 episodes of CP over the past 5 days, lasting less than 1 minute each. No associated symptoms. Does not resemble her previous angina, with which she experienced shooting pains across her chest, radiating into her neck along with dyspnea. Also reported generalized weakness, which has resolved following administration of IVF and K+ replacement. - cyclic troponin values have been negative and EKG without acute ischemic changes. - cardiac catheterization in 01/2014 showed 2 vessel CAD with continued patency of the stented segments in the left circumflex and right coronary arteries. LCx was with less than 20% of in-stent restenosis and RCA without significant in-stent restenosis with mild 50% stenosis involving the posterior AV segment of the RCA. Normal LV function at  that time. NST in 01/2015 was low-risk.  - She initially refused a NST due to how bad the Lexiscan medication made her feel in the past. She is now in agreement to have this performed as an inpatient. Will be done today (contacted Nuclear Medicine to confirm).  If no significant abnormalities, she will likely be stable for discharge from a Cardiology perspective.   2. CAD - history of CAD with DES to LCx in 07/2010 and DES to RCA in 04/2013. - continue ASA, Plavix, statin, and BB.  3. Hypokalemia - K+ 3.2 yesterday. Received 3 runs of IV K+. Will recheck K+ now. - she is on HCTZ (only 12.5mg  daily) at home. Would benefit from a high-potassium diet.  4. HLD - LDL 69 in 06/2015. - continue statin therapy.  Signed, Erma Heritage, PA-C 02/26/2016, 9:16 AM Pager: [phone]

## 2016-02-26 NOTE — Progress Notes (Signed)
[facility] PROGRESS NOTE    Progress Note  [name]  [id number] DOB: [birth year] DOA: 02/25/2016 PCP:  Melinda Crutch, MD     Brief Narrative:   [name] is an 70 y.o. female   Assessment/Plan:   Atypical Chest pain: Cardiac biomarkers are negative 3. EKG shows a normal sinus rhythm no ST segment abnormalities. Continue aspirin or Plavix, beta blocker and statins. She relates atypical chest pain. Awaiting cardiology consult.  Essential hypertension: Blood pressure seems to be will control continue current regimen.  CAD, NATIVE VESSEL Continue aspirin and Plavix.  Hypokalemia: Replete accordingly. Magnesium was 1.9.      DVT prophylaxis: lovenox Family Communication:none Disposition Plan/Barrier to D/C: home in am Code Status:     Code Status Orders        Start     Ordered   02/25/16 2231  Full code  Continuous     02/25/16 2232    Code Status History    Date Active Date Inactive Code Status Order ID Comments User Context   01/28/2015  1:46 AM 01/28/2015  8:29 PM Full Code [id number]  Rise Patience, MD Inpatient   01/28/2014  6:27 PM 01/28/2014 11:49 PM Full Code [id number]  Sherren Mocha, MD Inpatient   01/27/2014 12:09 PM 01/28/2014  6:27 PM Full Code [id number]  Jerline Pain, MD ED   05/22/2013  6:09 PM 05/24/2013  1:30 PM Full Code [id number]  Lonn Georgia, PA-C Inpatient    Advance Directive Documentation   Flowsheet Row Most Recent Value  Type of Advance Directive  Healthcare Power of Attorney, Living will  Pre-existing out of facility DNR order (yellow form or pink MOST form)  No data  "MOST" Form in Place?  No data        IV Access:    Peripheral IV   Procedures and diagnostic studies:   Dg Chest 2 View  Result Date: 02/25/2016 CLINICAL DATA:  Chest pain. EXAM: CHEST  2 VIEW COMPARISON:  Radiographs of January 27, 2015. FINDINGS: The heart size and mediastinal contours are within normal limits. Both lungs are clear. No  pneumothorax or pleural effusion is noted. The visualized skeletal structures are unremarkable. IMPRESSION: No active cardiopulmonary disease. Electronically Signed   By: Marijo Conception, M.D.   On: 02/25/2016 14:06     Medical Consultants:    None.  Anti-Infectives:   none  Subjective:    [name] no further chest pain shortness of breath palpitations or swelling.  Objective:    Vitals:   02/25/16 1700 02/25/16 1821 02/25/16 2010 02/26/16 0500  BP: 130/65 (!) 184/65 (!) 120/57 (!) 148/63  Pulse: 74 76 73 65  Resp: 22 20 16 18   Temp:  99.1 F (37.3 C) 98.2 F (36.8 C) 97.5 F (36.4 C)  TempSrc:  Oral Oral Oral  SpO2: 94% 97% 97% 97%  Weight:      Height:       No intake or output data in the 24 hours ending 02/26/16 0734 Filed Weights   02/25/16 1316  Weight: 62.6 kg (138 lb)    Exam: General exam: In no acute distress. Respiratory system: Good air movement and clear to auscultation. Cardiovascular system: S1 & S2 heard, RRR.  Gastrointestinal system: Abdomen is nondistended, soft and nontender.  Central nervous system: Alert and oriented. No focal neurological deficits. Extremities: No pedal edema. Skin: No rashes, lesions or ulcers Psychiatry: Judgement and insight appear normal. Mood & affect appropriate.  Data Reviewed:    Labs: Basic Metabolic Panel:  Recent Labs Lab 02/25/16 1330 02/26/16 0006  NA 141  --   K 3.2*  --   CL 103  --   CO2 28  --   GLUCOSE 121*  --   BUN 16  --   CREATININE 0.80  --   CALCIUM 9.3  --   MG  --  1.9  PHOS  --  4.0   GFR Estimated Creatinine Clearance: 56.1 mL/min (by C-G formula based on SCr of 0.8 mg/dL). Liver Function Tests: No results for input(s): AST, ALT, ALKPHOS, BILITOT, PROT, ALBUMIN in the last 168 hours. No results for input(s): LIPASE, AMYLASE in the last 168 hours. No results for input(s): AMMONIA in the last 168 hours. Coagulation profile No results for input(s): INR, PROTIME in  the last 168 hours.  CBC:  Recent Labs Lab 02/25/16 1330  WBC 10.2  NEUTROABS 7.3  HGB 12.9  HCT 40.2  MCV 87.4  PLT 262   Cardiac Enzymes:  Recent Labs Lab 02/25/16 1330 02/25/16 1859 02/26/16 0006 02/26/16 0607  TROPONINI <0.03 <0.03 <0.03 <0.03   BNP (last 3 results) No results for input(s): PROBNP in the last 8760 hours. CBG: No results for input(s): GLUCAP in the last 168 hours. D-Dimer: No results for input(s): DDIMER in the last 72 hours. Hgb A1c: No results for input(s): HGBA1C in the last 72 hours. Lipid Profile: No results for input(s): CHOL, HDL, LDLCALC, TRIG, CHOLHDL, LDLDIRECT in the last 72 hours. Thyroid function studies: No results for input(s): TSH, T4TOTAL, T3FREE, THYROIDAB in the last 72 hours.  Invalid input(s): FREET3 Anemia work up: No results for input(s): VITAMINB12, FOLATE, FERRITIN, TIBC, IRON, RETICCTPCT in the last 72 hours. Sepsis Labs:  Recent Labs Lab 02/25/16 1330  WBC 10.2   Microbiology No results found for this or any previous visit (from the past 240 hour(s)).   Medications:   . aspirin EC  81 mg Oral Daily  . clopidogrel  75 mg Oral Daily  . enoxaparin (LOVENOX) injection  40 mg Subcutaneous Q24H  . hydrochlorothiazide  12.5 mg Oral Daily  . irbesartan  150 mg Oral Daily  . levothyroxine  50 mcg Oral QAC breakfast  . metoprolol tartrate  12.5 mg Oral BID  . pantoprazole  40 mg Oral Daily  . rosuvastatin  20 mg Oral QHS   Continuous Infusions:   Time spent: 15 min   LOS: 0 days   Charlynne Cousins  [facility] Pager [phone]  *Please refer to [email or web address], password [email or web address] to get updated schedule on who will round on this patient, as hospitalists switch teams weekly. If 7PM-7AM, please contact night-coverage at [email or web address], password [email or web address] for any overnight needs.  02/26/2016, 7:34 AM

## 2016-02-26 NOTE — Progress Notes (Signed)
Patient questioned if she needed IV potassium due to receiving a bag at the [facility].  RN paged MD and RN still needs to administer the 2 doses.  RN is having to run them in at 30cc/hour due to arm burning even at 40 cc/hr. RN will continue to monitor. Alfredo Bach RN BSN 02/26/2016 2:51 AM

## 2016-02-26 NOTE — Progress Notes (Signed)
Family concerned about discharge.  Patient was told after stress test that she was cleared by cardiology for discharge.  Called Dr. Olevia Bowens and he was at [facility] and would not be able to get back to [facility] until after 20:00. Contacted PA Ellen Henri to see if she would be able to coordinate as discharge. Paged Dr. Olevia Bowens. Patient aware that she may have to stay overnight. Pt resting with call bell within reach.  Will continue to monitor. Payton Emerald, RN

## 2016-02-27 DIAGNOSIS — R0789 Other chest pain: Secondary | ICD-10-CM | POA: Diagnosis not present

## 2016-02-27 DIAGNOSIS — R079 Chest pain, unspecified: Secondary | ICD-10-CM | POA: Diagnosis not present

## 2016-02-27 DIAGNOSIS — E876 Hypokalemia: Secondary | ICD-10-CM | POA: Diagnosis not present

## 2016-02-27 DIAGNOSIS — I10 Essential (primary) hypertension: Secondary | ICD-10-CM | POA: Diagnosis not present

## 2016-02-27 LAB — BASIC METABOLIC PANEL
ANION GAP: 7 (ref 5–15)
BUN: 9 mg/dL (ref 6–20)
CALCIUM: 8.9 mg/dL (ref 8.9–10.3)
CO2: 27 mmol/L (ref 22–32)
CREATININE: 0.79 mg/dL (ref 0.44–1.00)
Chloride: 108 mmol/L (ref 101–111)
GFR calc Af Amer: >60 mL/min (ref >60)
GLUCOSE: 84 mg/dL (ref 65–99)
Potassium: 3.7 mmol/L (ref 3.5–5.1)
SODIUM: 142 mmol/L (ref 135–145)

## 2016-02-27 NOTE — Progress Notes (Signed)
Order received to discharge.  Telemetry removed and CCMD notified. IV removed with catheter intact.  Discharge education given to Pt with husband at bedside.  List of foods that are high in potassium given.  Pt denies further education needs.  Pt denies chest pain or sob.  Pt stable to discharge.

## 2016-02-27 NOTE — Discharge Summary (Signed)
Physician Discharge Summary  [name] [id number] DOB: 1946/04/16 DOA: 02/25/2016  PCP:  Melinda Crutch, MD  Admit date: 02/25/2016 Discharge date: 02/27/2016  Admitted From: HOME Disposition:  HOME  Recommendations for Outpatient Follow-up:  1. Follow up with PCP in 1-2 weeks, Follow-up on blood pressure and titrate antihypertensive medications as tolerated. 2. Please obtain BMP to monitor potassium.   Home Health:NO Equipment/Devices:NO  Discharge Condition:STABLE CODE STATUS:FULL Diet recommendation: Heart Healthy   Brief/Interim Summary: 70 year old with past medical history of hypertension coronary artery sees and hyperlipidemia comes in for chest pain that started 4 days prior to admission she relates is worse with extension and better with rest and feels fatigued. Discharge Diagnoses:  Atypical chest pain: Cardiac biomarkers were negative 3, there were no events on telemetry, EKG show normal sinus rhythm with no ST segment abnormalities, she was continued on aspirin and Plavix beta blockers and statins, cardiology was consulted recommended a stress test that showedsigns of reversible ischemia. She was discharged home on her regular treatment.  Essential hypertension: No changes were made.  Coronary artery disease Continue aspirin and Plavix. Next  Hypokalemia: Was repleted accordingly.   Discharge Instructions  Discharge Instructions    Diet - low sodium heart healthy    Complete by:  As directed   Increase activity slowly    Complete by:  As directed       Medication List    STOP taking these medications   cephALEXin 250 MG capsule Commonly known as:  KEFLEX     TAKE these medications   ASPIR-81 81 MG EC tablet Generic drug:  aspirin Take 81 mg by mouth daily.   CALTRATE 600 1500 (600 Ca) MG Tabs tablet Generic drug:  calcium carbonate Take by mouth 2 (two) times daily.   cholecalciferol 1000 units tablet Commonly known as:  VITAMIN D Take 1,000  Units by mouth daily.   clopidogrel 75 MG tablet Commonly known as:  PLAVIX Take 1 tablet (75 mg total) by mouth daily.   levothyroxine 50 MCG tablet Commonly known as:  SYNTHROID, LEVOTHROID Take 50 mcg by mouth daily before breakfast.   metoprolol tartrate 25 MG tablet Commonly known as:  LOPRESSOR Take 0.5 tablets (12.5 mg total) by mouth 2 (two) times daily.   MULTIVITAL tablet Take 1 tablet by mouth daily.   NEXIUM 40 MG capsule Generic drug:  esomeprazole Take 40 mg by mouth daily.   nitroGLYCERIN 0.4 MG SL tablet Commonly known as:  NITROSTAT Place 1 tablet (0.4 mg total) under the tongue every 5 (five) minutes x 3 doses as needed for chest pain.   rosuvastatin 20 MG tablet Commonly known as:  CRESTOR Take 1 tablet (20 mg total) by mouth at bedtime.   valsartan-hydrochlorothiazide 160-12.5 MG tablet Commonly known as:  DIOVAN HCT Take 1 tablet by mouth daily.      Follow-up Information    Dorris Carnes, MD Follow up on 04/09/2016.   Specialty:  Cardiology Why:  Cardiology Follow-Up on 04/09/2016 at 8:00AM.  Contact information: [address] [place] [address] [phone]          Allergies  Allergen Reactions  . Flagyl [Metronidazole Hcl] Anaphylaxis  . Atorvastatin     REACTION: myaligia  . Codeine Hives  . Prednisone     Makes my heart race  . Simvastatin     REACTION: myalgia  . Sulfa Antibiotics     Jittery  . Tamiflu [Oseltamivir] Itching  . Metronidazole Nausea  And Vomiting and Rash  . Penicillins Nausea And Vomiting and Rash    Consultations:  Cardiology   Procedures/Studies: Dg Chest 2 View  Result Date: 02/25/2016 CLINICAL DATA:  Chest pain. EXAM: CHEST  2 VIEW COMPARISON:  Radiographs of January 27, 2015. FINDINGS: The heart size and mediastinal contours are within normal limits. Both lungs are clear. No pneumothorax or pleural effusion is noted. The visualized skeletal structures are unremarkable. IMPRESSION: No  active cardiopulmonary disease. Electronically Signed   By: Marijo Conception, M.D.   On: 02/25/2016 14:06   Nm Myocar Multi W/spect W/wall Motion / Ef  Result Date: 02/26/2016 CLINICAL DATA:  Chest pain, shortness of breath, status post CABG EXAM: MYOCARDIAL IMAGING WITH SPECT (REST AND PHARMACOLOGIC-STRESS) GATED LEFT VENTRICULAR WALL MOTION STUDY LEFT VENTRICULAR EJECTION FRACTION TECHNIQUE: Standard myocardial SPECT imaging was performed after resting intravenous injection of 10 mCi Tc-41m tetrofosmin. Subsequently, intravenous infusion of Lexiscan was performed under the supervision of the Cardiology staff. At peak effect of the drug, 30 mCi Tc-2m tetrofosmin was injected intravenously and standard myocardial SPECT imaging was performed. Quantitative gated imaging was also performed to evaluate left ventricular wall motion, and estimate left ventricular ejection fraction. COMPARISON:  None. FINDINGS: Perfusion: No decreased activity in the left ventricle on stress imaging to suggest reversible ischemia or infarction. Suspected breast attenuation along the anterior left ventricle, more pronounced on the stress images. Wall Motion: Normal left ventricular wall motion. No left ventricular dilation. Left Ventricular Ejection Fraction: 80 % End diastolic volume 46 ml End systolic volume 9 ml IMPRESSION: 1. No reversible ischemia or infarction. 2. Normal left ventricular wall motion. 3. Left ventricular ejection fraction 80% 4. Non invasive risk stratification*: Low *2012 Appropriate Use Criteria for Coronary Revascularization Focused Update: J Am Coll Cardiol. [id number]. http://[email or web address].aspx?[email or web address] Electronically Signed   By: Julian Hy M.D.   On: 02/26/2016 15:48      Subjective: No new complains feels great.  Discharge Exam: Vitals:   02/26/16 2002 02/27/16 0645  BP: (!) 141/67 (!) 165/83  Pulse: 68 80  Resp: 18 16  Temp: 98.3 F (36.8 C) 97.6 F  (36.4 C)   Vitals:   02/26/16 1230 02/26/16 1456 02/26/16 2002 02/27/16 0645  BP:  137/67 (!) 141/67 (!) 165/83  Pulse: (!) 103  68 80  Resp:  17 18 16   Temp:  97.7 F (36.5 C) 98.3 F (36.8 C) 97.6 F (36.4 C)  TempSrc:  Oral Oral Oral  SpO2:  100% 97% 94%  Weight:      Height:        General: Pt is alert, awake, not in acute distress Cardiovascular: RRR, S1/S2 +, no rubs, no gallops Respiratory: CTA bilaterally, no wheezing, no rhonchi Abdominal: Soft, NT, ND, bowel sounds + Extremities: no edema, no cyanosis    The results of significant diagnostics from this hospitalization (including imaging, microbiology, ancillary and laboratory) are listed below for reference.     Microbiology: No results found for this or any previous visit (from the past 240 hour(s)).   Labs: BNP (last 3 results) No results for input(s): BNP in the last 8760 hours. Basic Metabolic Panel:  Recent Labs Lab 02/25/16 1330 02/26/16 0006 02/26/16 1001 02/27/16 0215  NA 141  --   --  142  K 3.2*  --  3.4* 3.7  CL 103  --   --  108  CO2 28  --   --  27  GLUCOSE 121*  --   --  84  BUN 16  --   --  9  CREATININE 0.80  --   --  0.79  CALCIUM 9.3  --   --  8.9  MG  --  1.9  --   --   PHOS  --  4.0  --   --    Liver Function Tests: No results for input(s): AST, ALT, ALKPHOS, BILITOT, PROT, ALBUMIN in the last 168 hours. No results for input(s): LIPASE, AMYLASE in the last 168 hours. No results for input(s): AMMONIA in the last 168 hours. CBC:  Recent Labs Lab 02/25/16 1330  WBC 10.2  NEUTROABS 7.3  HGB 12.9  HCT 40.2  MCV 87.4  PLT 262   Cardiac Enzymes:  Recent Labs Lab 02/25/16 1330 02/25/16 1859 02/26/16 0006 02/26/16 0607  TROPONINI <0.03 <0.03 <0.03 <0.03   BNP: Invalid input(s): POCBNP CBG: No results for input(s): GLUCAP in the last 168 hours. D-Dimer No results for input(s): DDIMER in the last 72 hours. Hgb A1c No results for input(s): HGBA1C in the last 72  hours. Lipid Profile No results for input(s): CHOL, HDL, LDLCALC, TRIG, CHOLHDL, LDLDIRECT in the last 72 hours. Thyroid function studies No results for input(s): TSH, T4TOTAL, T3FREE, THYROIDAB in the last 72 hours.  Invalid input(s): FREET3 Anemia work up No results for input(s): VITAMINB12, FOLATE, FERRITIN, TIBC, IRON, RETICCTPCT in the last 72 hours. Urinalysis    Component Value Date/Time   COLORURINE YELLOW 02/25/2016 1400   APPEARANCEUR CLEAR 02/25/2016 1400   LABSPEC 1.012 02/25/2016 1400   PHURINE 7.5 02/25/2016 1400   GLUCOSEU NEGATIVE 02/25/2016 1400   HGBUR NEGATIVE 02/25/2016 1400   BILIRUBINUR NEGATIVE 02/25/2016 1400   KETONESUR NEGATIVE 02/25/2016 1400   PROTEINUR NEGATIVE 02/25/2016 1400   UROBILINOGEN 0.2 01/27/2015 1740   NITRITE NEGATIVE 02/25/2016 1400   LEUKOCYTESUR SMALL (A) 02/25/2016 1400   Sepsis Labs Invalid input(s): PROCALCITONIN,  WBC,  LACTICIDVEN Microbiology No results found for this or any previous visit (from the past 240 hour(s)).   Time coordinating discharge: Over 30 minutes  SIGNED:   Charlynne Cousins, MD  [facility] 02/27/2016, 7:46 AM Pager   If 7PM-7AM, please contact night-coverage [email or web address] Password [email or web address]

## 2016-03-02 DIAGNOSIS — K219 Gastro-esophageal reflux disease without esophagitis: Secondary | ICD-10-CM | POA: Diagnosis not present

## 2016-03-02 DIAGNOSIS — E559 Vitamin D deficiency, unspecified: Secondary | ICD-10-CM | POA: Diagnosis not present

## 2016-03-02 DIAGNOSIS — M791 Myalgia: Secondary | ICD-10-CM | POA: Diagnosis not present

## 2016-03-02 DIAGNOSIS — E039 Hypothyroidism, unspecified: Secondary | ICD-10-CM | POA: Diagnosis not present

## 2016-03-02 DIAGNOSIS — E876 Hypokalemia: Secondary | ICD-10-CM | POA: Diagnosis not present

## 2016-03-02 DIAGNOSIS — I10 Essential (primary) hypertension: Secondary | ICD-10-CM | POA: Diagnosis not present

## 2016-03-02 DIAGNOSIS — Z0000 Encounter for general adult medical examination without abnormal findings: Secondary | ICD-10-CM | POA: Diagnosis not present

## 2016-03-02 DIAGNOSIS — Z23 Encounter for immunization: Secondary | ICD-10-CM | POA: Diagnosis not present

## 2016-03-02 DIAGNOSIS — D649 Anemia, unspecified: Secondary | ICD-10-CM | POA: Diagnosis not present

## 2016-03-02 DIAGNOSIS — E7800 Pure hypercholesterolemia, unspecified: Secondary | ICD-10-CM | POA: Diagnosis not present

## 2016-03-09 ENCOUNTER — Encounter: Payer: Self-pay | Admitting: Physician Assistant

## 2016-03-22 NOTE — Progress Notes (Signed)
Cardiology Office Note:    Date:  03/23/2016   ID:  [name], DOB 12/16/1945, MRN [id number]  PCP:   Melinda Crutch, MD  Cardiologist:  Dr. Dorris Carnes   Electrophysiologist:  n/a  Referring MD: Lona Kettle, MD   Chief Complaint  Patient presents with  . Hospitalization Follow-up    admx with chest pain    History of Present Illness:    [name] is a 70 y.o. female with a hx of CAD with prior PCI to LCx, s/p DES to RCA in 2014, HTN, HL.  LHC in 7/15 demonstrated patent stents in LCx and RCA.  Last seen by Dr. Dorris Carnes in 12/16.    Admitted in 8/2-4/17 with chest pain. MI was ruled out.  Inpatient Myoview was low risk and neg for ischemia.  She returns for FU.  Overall, doing well. She mainly felt weak when she went to the ED.  Her K+ was low and this was replaced.  She notes that she is back to walking.  She denies recurrent chest pain.  She denies significant dyspnea.  She denies orthopnea, PND, edema.  She denies syncope.   Prior CV studies that were reviewed today include:    Myoview 02/26/16 IMPRESSION: 1. No reversible ischemia or infarction. 2. Normal left ventricular wall motion. 3. Left ventricular ejection fraction 80% 4. Non invasive risk stratification*: Low  Echo 7/16 Mild LVH, EF 60-65%, normal wall motion, grade 1 diastolic dysfunction, mild MR, MAC, PASP 32 mmHg  LHC 7/15 LM ok LAD okay LCx proximal stent patent with 20% ISR RCA mid stent patent, posterior AV segment 50% EF 55-65%  LHC 10/14                RCA mid 60-70% PCI: 3.0 x 15 mm  Xience drug-eluting stent to mid RCA   Past Medical History:  Diagnosis Date  . Anginal pain ([place])   . Coronary atherosclerosis of native coronary artery    a. DES to LCx in 07/2010 b. DES to RCA in 04/2013  . Exertional shortness of breath   . Heart murmur   . Hiatal hernia   . History of blood transfusion    "after daughter was born" (05/22/2013)  . HLD (hyperlipidemia)   . HTN (hypertension)     . Hypothyroidism   . Osteoporosis   . Palpitations   . Premature ventricular contractions   . Recurrent UTI    "q 4 months for the last couple years; last one was 12/2012" (05/22/2013)  . Sinus headache   . Tremors of nervous system    by dr. love    Past Surgical History:  Procedure Laterality Date  . APPENDECTOMY    . BREAST CYST ASPIRATION Bilateral    "numerous in the dr's office" (05/22/2013)  . BREAST CYST EXCISION Right 1965  . BREAST LUMPECTOMY Right 1980's   "benign" (05/22/2013)  . CARDIAC CATHETERIZATION  ~ 1968; 08/2010  . CORONARY ANGIOPLASTY WITH STENT PLACEMENT  07/2010  . CORONARY ANGIOPLASTY WITH STENT PLACEMENT  05/23/2013   Patent LCx stent (10-20% ISR), 70% mid RCA (FFR 0.73) s/p DES, 20% prox RCA, 40% ostial D1, 60% distal posterior AV branch; EF 65-70%  . ECTOPIC PREGNANCY SURGERY    . LAPAROTOMY  1965   "ruptured blood vessel in my side during childbirth; didn't find it til 2 days later; had to open me up to repair it" (05/22/2013)  . LEFT HEART CATHETERIZATION WITH CORONARY ANGIOGRAM N/A 05/23/2013  Procedure: LEFT HEART CATHETERIZATION WITH CORONARY ANGIOGRAM;  Surgeon: Wellington Hampshire, MD;  Location: [facility] CATH LAB;  Service: Cardiovascular;  Laterality: N/A;  . LEFT HEART CATHETERIZATION WITH CORONARY ANGIOGRAM N/A 01/28/2014   Procedure: LEFT HEART CATHETERIZATION WITH CORONARY ANGIOGRAM;  Surgeon: Blane Ohara, MD;  Location: [facility] CATH LAB;  Service: Cardiovascular;  Laterality: N/A;  . TONSILLECTOMY    . TOTAL ABDOMINAL HYSTERECTOMY      Current Medications: Outpatient Medications Prior to Visit  Medication Sig Dispense Refill  . aspirin (ASPIR-81) 81 MG EC tablet Take 81 mg by mouth daily.      . Calcium Carbonate (CALTRATE 600) 1500 MG TABS Take by mouth 2 (two) times daily.     . cholecalciferol (VITAMIN D) 1000 UNITS tablet Take 1,000 Units by mouth daily.    Marland Kitchen esomeprazole (NEXIUM) 40 MG capsule Take 40 mg by mouth daily.      Marland Kitchen levothyroxine  (SYNTHROID, LEVOTHROID) 50 MCG tablet Take 50 mcg by mouth daily before breakfast.    . Multiple Vitamins-Minerals (MULTIVITAL) tablet Take 1 tablet by mouth daily.      . rosuvastatin (CRESTOR) 20 MG tablet Take 1 tablet (20 mg total) by mouth at bedtime. 90 tablet 3  . valsartan-hydrochlorothiazide (DIOVAN HCT) 160-12.5 MG tablet Take 1 tablet by mouth daily. 90 tablet 3  . clopidogrel (PLAVIX) 75 MG tablet Take 1 tablet (75 mg total) by mouth daily. 90 tablet 3  . metoprolol tartrate (LOPRESSOR) 25 MG tablet Take 0.5 tablets (12.5 mg total) by mouth 2 (two) times daily. 90 tablet 3  . nitroGLYCERIN (NITROSTAT) 0.4 MG SL tablet Place 1 tablet (0.4 mg total) under the tongue every 5 (five) minutes x 3 doses as needed for chest pain. 25 tablet 3   No facility-administered medications prior to visit.       Allergies:   Flagyl [metronidazole hcl]; Atorvastatin; Codeine; Prednisone; Simvastatin; Sulfa antibiotics; Tamiflu [oseltamivir]; Metronidazole; and Penicillins   Social History   Social History  . Marital status: Married    Spouse name: N/A  . Number of children: N/A  . Years of education: N/A   Occupational History  . Retired Oceanographer    Social History Main Topics  . Smoking status: Never Smoker  . Smokeless tobacco: Never Used  . Alcohol use No  . Drug use: No  . Sexual activity: Not Asked   Other Topics Concern  . None   Social History Narrative  . None     Family History:  The patient's family history includes CAD in her mother; Heart attack (age of onset: 15) in her brother; Heart disease in her father; Lymphoma in her mother; Stroke in her other.   ROS:   Please see the history of present illness.    Review of Systems  Eyes: Positive for visual disturbance.  Respiratory: Positive for snoring.   Musculoskeletal: Positive for back pain.   All other systems reviewed and are negative.   EKGs/Labs/Other Test Reviewed:    EKG:  EKG is  ordered today.   The ekg ordered today demonstrated NSR, HR 74, normal axis, QTc 452 ms, PVC, similar to prior tracings  Recent Labs: 09/11/2015: ALT 24 02/25/2016: Hemoglobin 12.9; Platelets 262 02/26/2016: Magnesium 1.9 02/27/2016: BUN 9; Creatinine, Ser 0.79; Potassium 3.7; Sodium 142   Recent Lipid Panel    Component Value Date/Time   CHOL 160 07/11/2015 0927   TRIG 205 (H) 07/11/2015 0927   HDL 50 07/11/2015 0927   CHOLHDL  3.2 07/11/2015 0927   VLDL 41 (H) 07/11/2015 0927   LDLCALC 69 07/11/2015 0927   LDLDIRECT 121.5 09/28/2010 0914     Physical Exam:    VS:  BP (!) 178/80   Pulse 74   Ht 5' 2.5" (1.588 m)   Wt 141 lb 6.4 oz (64.1 kg)   BMI 25.45 kg/m     Wt Readings from Last 3 Encounters:  03/23/16 141 lb 6.4 oz (64.1 kg)  02/25/16 138 lb (62.6 kg)  09/11/15 135 lb (61.2 kg)     Physical Exam  Constitutional: She is oriented to person, place, and time. She appears well-developed and well-nourished. No distress.  HENT:  Head: Normocephalic and atraumatic.  Eyes: No scleral icterus.  Neck: No JVD present.  Cardiovascular: Normal rate and regular rhythm.   Murmur heard.  Low-pitched early systolic murmur is present with a grade of 2/6  at the upper right sternal border Pulses:      Carotid pulses are on the left side with bruit. Pulmonary/Chest: Effort normal and breath sounds normal. She has no wheezes. She has no rales.  Abdominal: Soft. There is no tenderness.  Musculoskeletal: She exhibits no edema.  Neurological: She is alert and oriented to person, place, and time.  Skin: Skin is warm and dry.  Psychiatric: She has a normal mood and affect.    ASSESSMENT:    1. Atherosclerosis of native coronary artery of native heart without angina pectoris   2. Left carotid bruit   3. Essential hypertension   4. Hyperlipidemia    PLAN:    In order of problems listed above:  1. CAD - She is status post prior PCI of the LCx and RCA. Cardiac catheterization in 2015 demonstrated  patent stents. She had a recent admission with chest pain.  MI was ruled out and her Myoview was low risk.  No further symptoms.  Continue ASA, Plavix, statin.    2. L carotid bruit - Arrange carotid ultrasound.  3. HTN - BP high today.  It is usually well controlled.  She is due for her Diovan/HCT now.  I have asked her to monitor her BP at home and to send recordings after 2 weeks.  4. HL - Recent labs from PCP 02/25/16: TC 181, Trigs 274, HDL 51, LDL 75, ALT 14.  Continue statin.    Medication Adjustments/Labs and Tests Ordered: Current medicines are reviewed at length with the patient today.  Concerns regarding medicines are outlined above.  Medication changes, Labs and Tests ordered today are outlined in the Patient Instructions noted below. Patient Instructions  Medication Instructions:  Your physician recommends that you continue on your current medications as directed. Please refer to the Current Medication list given to you today. Labwork: NONE Testing/Procedures: Your physician has requested that you have a carotid duplex. This test is an ultrasound of the carotid arteries in your neck. It looks at blood flow through these arteries that supply the brain with blood. Allow one hour for this exam. There are no restrictions or special instructions. Follow-Up: Your physician wants you to follow-up in: [place] DR. ROSS OR [facility] WEAVER, [place]  You will receive a reminder letter in the mail two months in advance. If you don't receive a letter, please call our office to schedule the follow-up appointment. Any Other Special Instructions Will Be Listed Below (If Applicable). If you need a refill on your cardiac medications before your next appointment, please call your pharmacy  Signed, Nicki Reaper  Jorene Minors  03/23/2016 11:46 AM    [facility] [address], [place], [place]  [address] Phone: [phone]; Fax: [phone]

## 2016-03-23 ENCOUNTER — Encounter: Payer: Self-pay | Admitting: Physician Assistant

## 2016-03-23 ENCOUNTER — Ambulatory Visit (INDEPENDENT_AMBULATORY_CARE_PROVIDER_SITE_OTHER): Payer: Medicare Other | Admitting: Physician Assistant

## 2016-03-23 VITALS — BP 178/80 | HR 74 | Ht 62.5 in | Wt 141.4 lb

## 2016-03-23 DIAGNOSIS — I10 Essential (primary) hypertension: Secondary | ICD-10-CM

## 2016-03-23 DIAGNOSIS — E785 Hyperlipidemia, unspecified: Secondary | ICD-10-CM | POA: Diagnosis not present

## 2016-03-23 DIAGNOSIS — R0989 Other specified symptoms and signs involving the circulatory and respiratory systems: Secondary | ICD-10-CM

## 2016-03-23 DIAGNOSIS — I2510 Atherosclerotic heart disease of native coronary artery without angina pectoris: Secondary | ICD-10-CM | POA: Diagnosis not present

## 2016-03-23 MED ORDER — CLOPIDOGREL BISULFATE 75 MG PO TABS: 75.0000 mg | ORAL_TABLET | Freq: Every day | ORAL | 3 refills | Status: DC

## 2016-03-23 MED ORDER — METOPROLOL TARTRATE 25 MG PO TABS: 12.5000 mg | ORAL_TABLET | Freq: Two times a day (BID) | ORAL | 3 refills | Status: DC

## 2016-03-23 MED ORDER — NITROGLYCERIN 0.4 MG SL SUBL: 0.4000 mg | SUBLINGUAL_TABLET | SUBLINGUAL | 3 refills | Status: DC | PRN

## 2016-03-23 NOTE — Patient Instructions (Addendum)
Medication Instructions:  Your physician recommends that you continue on your current medications as directed. Please refer to the Current Medication list given to you today. Labwork: NONE Testing/Procedures: Your physician has requested that you have a carotid duplex. This test is an ultrasound of the carotid arteries in your neck. It looks at blood flow through these arteries that supply the brain with blood. Allow one hour for this exam. There are no restrictions or special instructions. Follow-Up: Your physician wants you to follow-up in: [place] DR. ROSS OR [facility] WEAVER, PAC  You will receive a reminder letter in the mail two months in advance. If you don't receive a letter, please call our office to schedule the follow-up appointment. Any Other Special Instructions Will Be Listed Below (If Applicable). If you need a refill on your cardiac medications before your next appointment, please call your pharmacy

## 2016-03-30 ENCOUNTER — Ambulatory Visit (HOSPITAL_COMMUNITY)
Admission: RE | Admit: 2016-03-30 | Discharge: 2016-03-30 | Disposition: A | Discharge status: Home / Self Care | Payer: Medicare Other | Source: Ambulatory Visit | Attending: Physician Assistant | Admitting: Physician Assistant

## 2016-03-30 DIAGNOSIS — E785 Hyperlipidemia, unspecified: Secondary | ICD-10-CM | POA: Diagnosis not present

## 2016-03-30 DIAGNOSIS — R0989 Other specified symptoms and signs involving the circulatory and respiratory systems: Secondary | ICD-10-CM | POA: Diagnosis not present

## 2016-03-30 DIAGNOSIS — I2510 Atherosclerotic heart disease of native coronary artery without angina pectoris: Secondary | ICD-10-CM | POA: Insufficient documentation

## 2016-03-30 DIAGNOSIS — I10 Essential (primary) hypertension: Secondary | ICD-10-CM | POA: Diagnosis not present

## 2016-03-30 DIAGNOSIS — I6523 Occlusion and stenosis of bilateral carotid arteries: Secondary | ICD-10-CM | POA: Insufficient documentation

## 2016-04-01 ENCOUNTER — Encounter: Payer: Self-pay | Admitting: Physician Assistant

## 2016-04-01 DIAGNOSIS — I739 Peripheral vascular disease, unspecified: Secondary | ICD-10-CM

## 2016-04-01 DIAGNOSIS — I779 Disorder of arteries and arterioles, unspecified: Secondary | ICD-10-CM | POA: Insufficient documentation

## 2016-04-09 ENCOUNTER — Ambulatory Visit: Payer: Medicare Other | Admitting: Internal Medicine

## 2016-04-21 ENCOUNTER — Other Ambulatory Visit: Payer: Self-pay | Admitting: Internal Medicine

## 2016-04-26 DIAGNOSIS — Z23 Encounter for immunization: Secondary | ICD-10-CM | POA: Diagnosis not present

## 2016-06-06 IMAGING — CR DG CHEST 2V
2 series · 2 of 2 positions shown · non-contrast
Comparison: Chest radiograph performed 02/12/2014

CLINICAL DATA: Acute onset of generalized weakness. Initial
encounter.

EXAM:
CHEST  2 VIEW

[w chest pa]
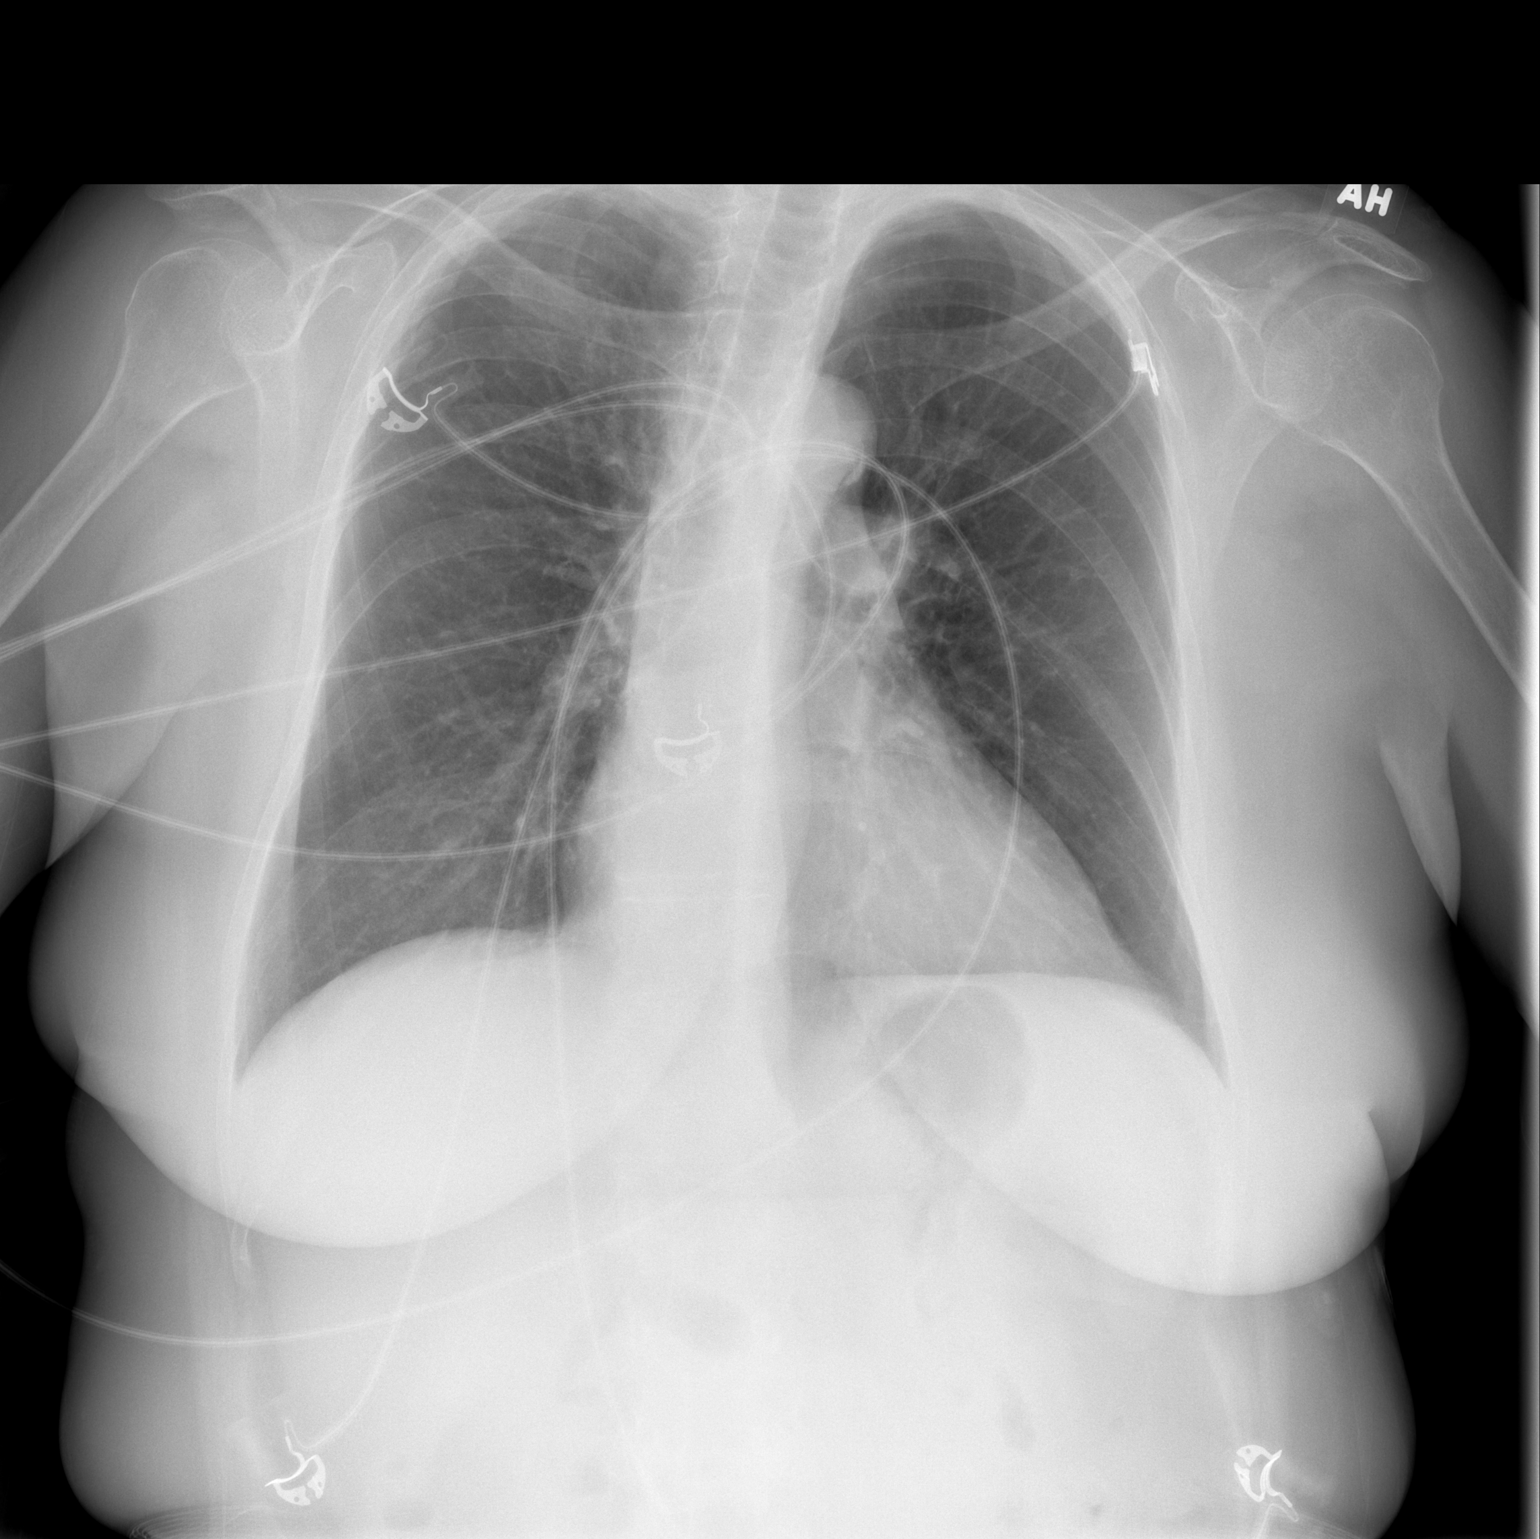

[w chest lat]
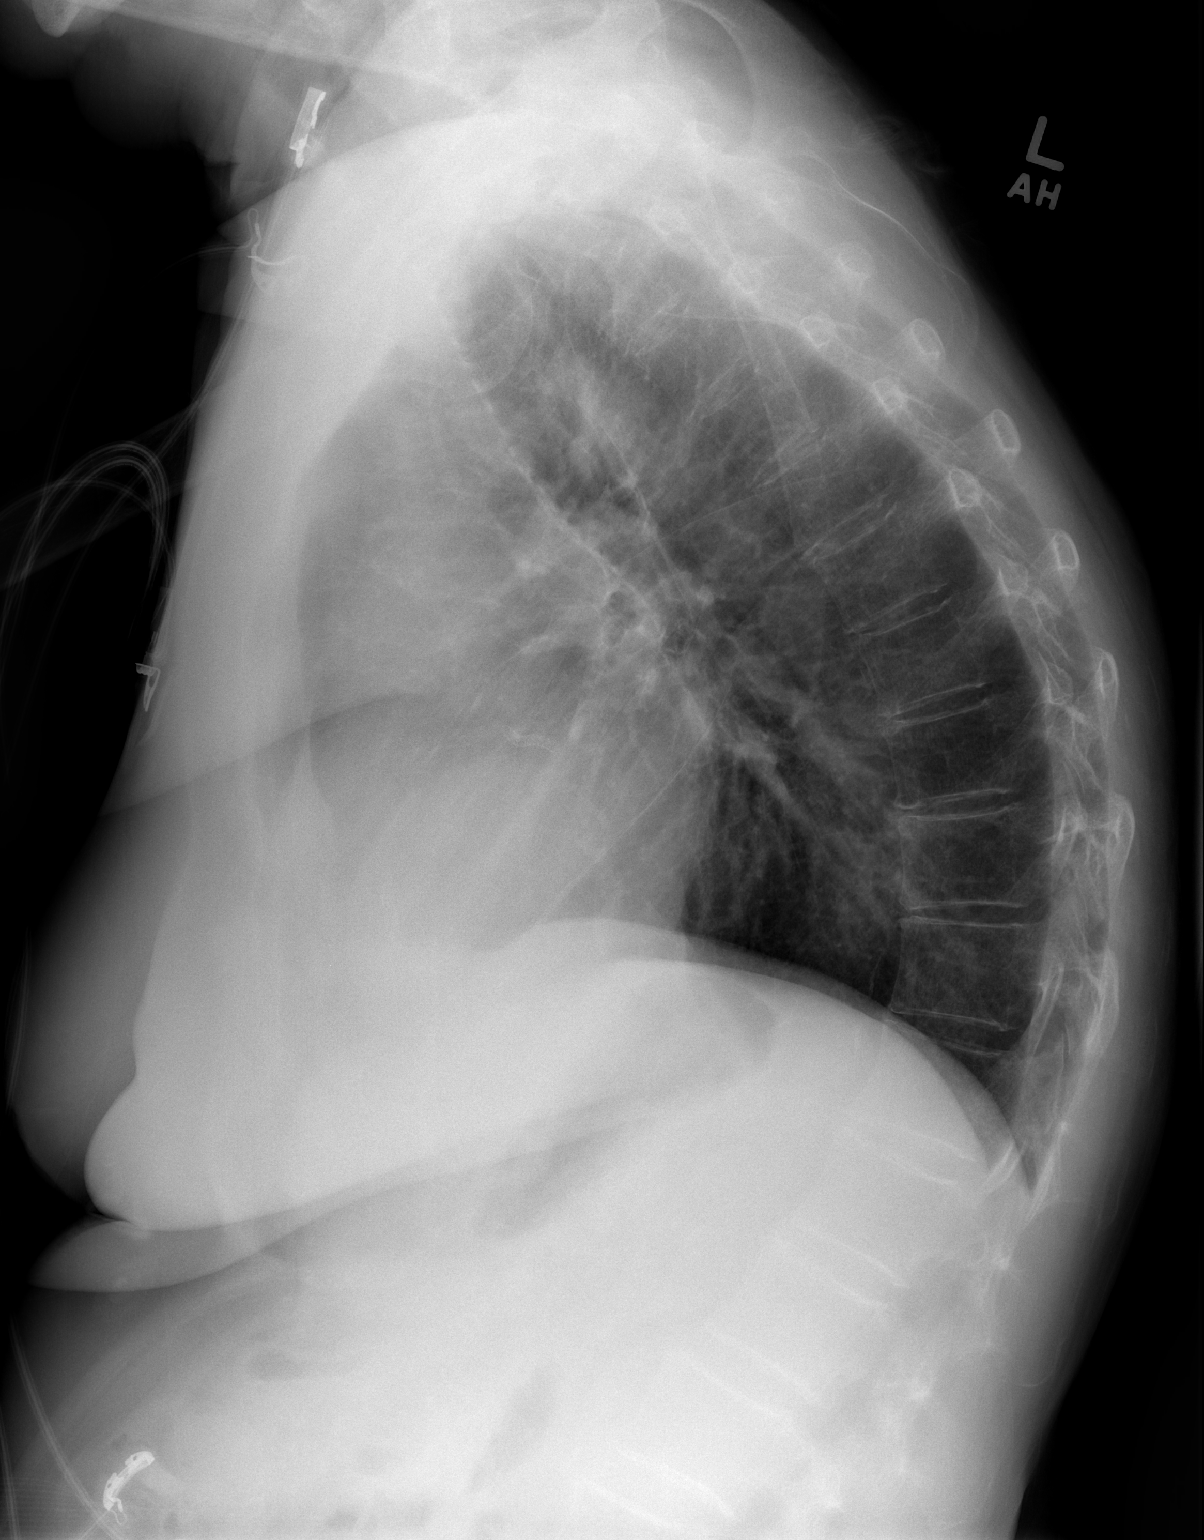

[2 of 2 positions shown; findings below may reference images not displayed]

FINDINGS: The lungs are well-aerated. Minimal scarring is noted at the right
lung apex. There is no evidence of focal opacification, pleural
effusion or pneumothorax.

The heart is normal in size; the mediastinal contour is within
normal limits. No acute osseous abnormalities are seen.
IMPRESSION: No acute cardiopulmonary process seen.

## 2016-07-20 DIAGNOSIS — J329 Chronic sinusitis, unspecified: Secondary | ICD-10-CM | POA: Diagnosis not present

## 2016-07-31 ENCOUNTER — Other Ambulatory Visit: Payer: Self-pay | Admitting: Internal Medicine

## 2016-08-02 NOTE — Telephone Encounter (Signed)
Supervision Information   Supervising Provider Type of Supervision  Belva Crome, MD Incident To  Medication Detail    Disp Refills Start End   metoprolol tartrate (LOPRESSOR) 25 MG tablet 90 tablet 3 03/23/2016    Sig - Route: Take 0.5 tablets (12.5 mg total) by mouth 2 (two) times daily. - Oral   E-Prescribing Status: Receipt confirmed by pharmacy (03/23/2016 11:39 AM EDT)   Pharmacy   [id number] - [place], [place] - [address]

## 2016-08-06 DIAGNOSIS — Z1231 Encounter for screening mammogram for malignant neoplasm of breast: Secondary | ICD-10-CM | POA: Diagnosis not present

## 2016-08-24 ENCOUNTER — Other Ambulatory Visit: Payer: Self-pay | Admitting: Internal Medicine

## 2016-08-25 NOTE — Telephone Encounter (Signed)
Medication Detail    Disp Refills Start End   clopidogrel (PLAVIX) 75 MG tablet 90 tablet 3 03/23/2016    Sig - Route: Take 1 tablet (75 mg total) by mouth daily. - Oral   E-Prescribing Status: Receipt confirmed by pharmacy (03/23/2016 11:39 AM EDT)   Pharmacy   [id number] - [place], [place] - [address]

## 2016-10-18 ENCOUNTER — Observation Stay (HOSPITAL_COMMUNITY)
Admission: EM | Admit: 2016-10-18 | Discharge: 2016-10-19 | Disposition: A | Discharge status: Home / Self Care | Payer: Medicare Other | Attending: Internal Medicine | Admitting: Internal Medicine

## 2016-10-18 ENCOUNTER — Emergency Department (HOSPITAL_COMMUNITY): Payer: Medicare Other

## 2016-10-18 DIAGNOSIS — I10 Essential (primary) hypertension: Secondary | ICD-10-CM | POA: Diagnosis present

## 2016-10-18 DIAGNOSIS — E785 Hyperlipidemia, unspecified: Secondary | ICD-10-CM | POA: Diagnosis not present

## 2016-10-18 DIAGNOSIS — Z7982 Long term (current) use of aspirin: Secondary | ICD-10-CM | POA: Insufficient documentation

## 2016-10-18 DIAGNOSIS — Z955 Presence of coronary angioplasty implant and graft: Secondary | ICD-10-CM | POA: Diagnosis not present

## 2016-10-18 DIAGNOSIS — I2510 Atherosclerotic heart disease of native coronary artery without angina pectoris: Secondary | ICD-10-CM | POA: Diagnosis present

## 2016-10-18 DIAGNOSIS — R0602 Shortness of breath: Secondary | ICD-10-CM | POA: Diagnosis not present

## 2016-10-18 DIAGNOSIS — Z79899 Other long term (current) drug therapy: Secondary | ICD-10-CM | POA: Diagnosis not present

## 2016-10-18 DIAGNOSIS — R9431 Abnormal electrocardiogram [ECG] [EKG]: Secondary | ICD-10-CM | POA: Diagnosis present

## 2016-10-18 DIAGNOSIS — Z7902 Long term (current) use of antithrombotics/antiplatelets: Secondary | ICD-10-CM | POA: Insufficient documentation

## 2016-10-18 DIAGNOSIS — R079 Chest pain, unspecified: Secondary | ICD-10-CM | POA: Diagnosis not present

## 2016-10-18 DIAGNOSIS — R072 Precordial pain: Secondary | ICD-10-CM | POA: Diagnosis not present

## 2016-10-18 DIAGNOSIS — K219 Gastro-esophageal reflux disease without esophagitis: Secondary | ICD-10-CM | POA: Diagnosis not present

## 2016-10-18 DIAGNOSIS — M810 Age-related osteoporosis without current pathological fracture: Secondary | ICD-10-CM | POA: Diagnosis not present

## 2016-10-18 DIAGNOSIS — R0789 Other chest pain: Secondary | ICD-10-CM | POA: Diagnosis not present

## 2016-10-18 DIAGNOSIS — E039 Hypothyroidism, unspecified: Secondary | ICD-10-CM | POA: Diagnosis not present

## 2016-10-18 LAB — I-STAT TROPONIN, ED: TROPONIN I, POC: 0 ng/mL (ref 0.00–0.08)

## 2016-10-18 LAB — CBC
HCT: 36.5 % (ref 36.0–46.0)
HEMOGLOBIN: 11.4 g/dL — AB (ref 12.0–15.0)
MCH: 27.1 pg (ref 26.0–34.0)
MCHC: 31.2 g/dL (ref 30.0–36.0)
MCV: 86.9 fL (ref 78.0–100.0)
PLATELETS: 257 10*3/uL (ref 150–400)
RBC: 4.2 MIL/uL (ref 3.87–5.11)
RDW: 13.7 % (ref 11.5–15.5)
WBC: 9.2 10*3/uL (ref 4.0–10.5)

## 2016-10-18 LAB — BASIC METABOLIC PANEL
Anion gap: 7 (ref 5–15)
BUN: 13 mg/dL (ref 6–20)
CALCIUM: 9.4 mg/dL (ref 8.9–10.3)
CHLORIDE: 103 mmol/L (ref 101–111)
CO2: 31 mmol/L (ref 22–32)
CREATININE: 0.84 mg/dL (ref 0.44–1.00)
GFR calc non Af Amer: >60 mL/min (ref >60)
Glucose, Bld: 90 mg/dL (ref 65–99)
Potassium: 3.5 mmol/L (ref 3.5–5.1)
SODIUM: 141 mmol/L (ref 135–145)

## 2016-10-18 LAB — TROPONIN I: Troponin I: <0.03 ng/mL (ref <0.03)

## 2016-10-18 LAB — MAGNESIUM: Magnesium: 2.2 mg/dL (ref 1.7–2.4)

## 2016-10-18 MED ORDER — MORPHINE SULFATE (PF) 2 MG/ML IV SOLN: 2.0000 mg | INTRAVENOUS | Status: DC | PRN

## 2016-10-18 MED ORDER — VALSARTAN-HYDROCHLOROTHIAZIDE 160-12.5 MG PO TABS: 1.0000 | ORAL_TABLET | Freq: Every day | ORAL | Status: DC

## 2016-10-18 MED ORDER — ADULT MULTIVITAMIN W/MINERALS CH
1.0000 | ORAL_TABLET | Freq: Every day | ORAL | Status: DC
  Administered 2016-10-19: 1 via ORAL
  Filled 2016-10-18: qty 1

## 2016-10-18 MED ORDER — ENOXAPARIN SODIUM 40 MG/0.4ML ~~LOC~~ SOLN
40.0000 mg | SUBCUTANEOUS | Status: DC
  Administered 2016-10-18: 40 mg via SUBCUTANEOUS
  Filled 2016-10-18: qty 0.4

## 2016-10-18 MED ORDER — PANTOPRAZOLE SODIUM 40 MG PO TBEC
40.0000 mg | DELAYED_RELEASE_TABLET | Freq: Every day | ORAL | Status: DC
  Administered 2016-10-19: 40 mg via ORAL
  Filled 2016-10-18: qty 1

## 2016-10-18 MED ORDER — METOPROLOL TARTRATE 12.5 MG HALF TABLET
12.5000 mg | ORAL_TABLET | Freq: Two times a day (BID) | ORAL | Status: DC
  Administered 2016-10-18 – 2016-10-19 (×2): 12.5 mg via ORAL
  Filled 2016-10-18 (×2): qty 1

## 2016-10-18 MED ORDER — ONDANSETRON HCL 4 MG/2ML IJ SOLN: 4.0000 mg | Freq: Four times a day (QID) | INTRAMUSCULAR | Status: DC | PRN

## 2016-10-18 MED ORDER — CALCIUM CARBONATE 1250 (500 CA) MG PO TABS
1250.0000 mg | ORAL_TABLET | Freq: Two times a day (BID) | ORAL | Status: DC
  Filled 2016-10-18: qty 1

## 2016-10-18 MED ORDER — LEVOTHYROXINE SODIUM 50 MCG PO TABS
50.0000 ug | ORAL_TABLET | Freq: Every day | ORAL | Status: DC
  Administered 2016-10-19: 50 ug via ORAL
  Filled 2016-10-18: qty 1

## 2016-10-18 MED ORDER — ACETAMINOPHEN 325 MG PO TABS
650.0000 mg | ORAL_TABLET | ORAL | Status: DC | PRN
  Filled 2016-10-18: qty 2

## 2016-10-18 MED ORDER — GI COCKTAIL ~~LOC~~: 30.0000 mL | Freq: Four times a day (QID) | ORAL | Status: DC | PRN

## 2016-10-18 MED ORDER — IRBESARTAN 150 MG PO TABS
150.0000 mg | ORAL_TABLET | Freq: Every day | ORAL | Status: DC
  Administered 2016-10-19: 150 mg via ORAL
  Filled 2016-10-18: qty 1

## 2016-10-18 MED ORDER — ASPIRIN EC 81 MG PO TBEC
81.0000 mg | DELAYED_RELEASE_TABLET | Freq: Every day | ORAL | Status: DC
  Administered 2016-10-19: 81 mg via ORAL
  Filled 2016-10-18: qty 1

## 2016-10-18 MED ORDER — CLOPIDOGREL BISULFATE 75 MG PO TABS
75.0000 mg | ORAL_TABLET | Freq: Every day | ORAL | Status: DC
  Administered 2016-10-19: 75 mg via ORAL
  Filled 2016-10-18: qty 1

## 2016-10-18 MED ORDER — ROSUVASTATIN CALCIUM 10 MG PO TABS
20.0000 mg | ORAL_TABLET | Freq: Every day | ORAL | Status: DC
  Administered 2016-10-18: 20 mg via ORAL
  Filled 2016-10-18: qty 2

## 2016-10-18 MED ORDER — HYDROCHLOROTHIAZIDE 12.5 MG PO CAPS
12.5000 mg | ORAL_CAPSULE | Freq: Every day | ORAL | Status: DC
  Administered 2016-10-19: 12.5 mg via ORAL
  Filled 2016-10-18: qty 1

## 2016-10-18 MED ORDER — NITROGLYCERIN 0.4 MG SL SUBL: 0.4000 mg | SUBLINGUAL_TABLET | SUBLINGUAL | Status: DC | PRN

## 2016-10-18 MED ORDER — SODIUM CHLORIDE 0.9 % IV SOLN
INTRAVENOUS | Status: DC
  Administered 2016-10-18: 22:00:00 via INTRAVENOUS

## 2016-10-18 NOTE — H&P (Signed)
History and Physical    TU SHIMMEL MGQ:[id number] DOB: 1945-09-27 DOA: 10/18/2016  Referring MD/NP/PA: Shary Decamp, PA-C PCP: Melinda Crutch, MD  Patient coming from: fire department   Chief Complaint: Chest pain  HPI: [name] is a 71 y.o. female with medical history significant of HTN, HLD, CAD s/p stents last in 2015, and hiatal hernia; who presents with acute onset of  chest pain around 12:30 PM. Patient reports that she had just finished eating lunch when she describes as a hard substernal pain started. She initially took one nitroglycerin pain was noted to be about a 7 out of 10 thereafter. Associated symptoms included upper left arm pain, palpitations, and lightheadedness. 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 She was given a second nitroglycerin in which she reports her chest pain symptoms went down to a 2 out of 7. Denies having any nausea, vomiting, diaphoresis, loss of consciousness, cough, lower extremity swelling, calf pain, or recent travel. Symptoms were similar to chest pain symptoms where she required placement of a stent. Patient is followed by Dr. Dorris Carnes of cardiology and has a follow-up appointment sometime next month. Her last echocardiogram showed EF of 60-65% with grade 1 diastolic dysfunction in 08/6710. Patient also reports intermittent "breath catching" where she reports having to catch her breath that has been occurring 2-3 times daily. En route patient was noted to having PVCs every fourth beat.  ED Course: Upon admission to the emergency department patient was  noted to have relatively normal vital signs. Lab work was unremarkable. EKG showing no significant ischemic changes.   Review of Systems: As per HPI otherwise 10 point review of systems negative.   Past Medical History:  Diagnosis Date  . Anginal pain ([employer])   . Carotid artery disease ([place])    a. Carotid US 9/17: R 1-39%; L 40-59% >> FU 1 year  . Coronary atherosclerosis of native  coronary artery    a. DES to LCx in 07/2010 b. DES to RCA in 04/2013  . Exertional shortness of breath   . Heart murmur   . Hiatal hernia   . History of blood transfusion    "after daughter was born" (05/22/2013)  . HLD (hyperlipidemia)   . HTN (hypertension)   . Hypothyroidism   . Osteoporosis   . Palpitations   . Premature ventricular contractions   . Recurrent UTI    "q 4 months for the last couple years; last one was 12/2012" (05/22/2013)  . Sinus headache   . Tremors of nervous system    by dr. love    Past Surgical History:  Procedure Laterality Date  . APPENDECTOMY    . BREAST CYST ASPIRATION Bilateral    "numerous in the dr's office" (05/22/2013)  . BREAST CYST EXCISION Right 1965  . BREAST LUMPECTOMY Right 1980's   "benign" (05/22/2013)  . CARDIAC CATHETERIZATION  ~ 1968; 08/2010  . CORONARY ANGIOPLASTY WITH STENT PLACEMENT  07/2010  . CORONARY ANGIOPLASTY WITH STENT PLACEMENT  05/23/2013   Patent LCx stent (10-20% ISR), 70% mid RCA (FFR 0.73) s/p DES, 20% prox RCA, 40% ostial D1, 60% distal posterior AV branch; EF 65-70%  . ECTOPIC PREGNANCY SURGERY    . LAPAROTOMY  1965   "ruptured blood vessel in my side during childbirth; didn't find it til 2 days later; had to open me up to repair it" (05/22/2013)  . LEFT HEART CATHETERIZATION WITH CORONARY ANGIOGRAM N/A 05/23/2013   Procedure: LEFT HEART CATHETERIZATION WITH CORONARY ANGIOGRAM;  Surgeon: Wellington Hampshire, MD;  Location: [facility] C[facility] LAB;  Service: Cardiovascular;  Laterality: N/A;  . LEFT HEART CATHETERIZATION WITH CORONARY ANGIOGRAM N/A 01/28/2014   Procedure: LEFT HEART CATHETERIZATION WITH CORONARY ANGIOGRAM;  Surgeon: Blane Ohara, MD;  Location: [facility] C[facility] LAB;  Service: Cardiovascular;  Laterality: N/A;  . TONSILLECTOMY    . TOTAL ABDOMINAL HYSTERECTOMY       reports that she has never smoked. She has never used smokeless tobacco. She reports that she does not drink alcohol or use drugs.  Allergies  Allergen  Reactions  . Atorvastatin     REACTION: myaligia  . Codeine Hives  . Flagyl [Metronidazole Hcl] Anaphylaxis  . Simvastatin Other (See Comments)    REACTION: myalgia  . Sulfa Antibiotics Other (See Comments)    Jittery  . Tamiflu [Oseltamivir] Itching  . Metronidazole Nausea And Vomiting and Rash  . Penicillins Nausea And Vomiting and Rash  . Prednisone Palpitations    Makes my heart race    Family History  Problem Relation Age of Onset  . CAD Mother   . Lymphoma Mother   . Heart disease Father   . Heart attack Brother 36  . Stroke Other     Prior to Admission medications   Medication Sig Start Date End Date Taking? Authorizing Provider  aspirin (ASPIR-81) 81 MG EC tablet Take 81 mg by mouth daily.     Yes Historical Provider, MD  Calcium Carbonate (CALTRATE 600) 1500 MG TABS Take by mouth 2 (two) times daily.    Yes Historical Provider, MD  cholecalciferol (VITAMIN D) 1000 UNITS tablet Take 1,000 Units by mouth daily.   Yes Historical Provider, MD  clopidogrel (PLAVIX) 75 MG tablet Take 1 tablet (75 mg total) by mouth daily. 03/23/16  Yes Scott Joylene Draft, PA-C  esomeprazole (NEXIUM) 40 MG capsule Take 40 mg by mouth daily.     Yes Historical Provider, MD  levothyroxine (SYNTHROID, LEVOTHROID) 50 MCG tablet Take 50 mcg by mouth daily before breakfast.   Yes Historical Provider, MD  metoprolol tartrate (LOPRESSOR) 25 MG tablet Take 0.5 tablets (12.5 mg total) by mouth 2 (two) times daily. 03/23/16  Yes Liliane Shi, PA-C  Multiple Vitamins-Minerals (MULTIVITAL) tablet Take 1 tablet by mouth daily.     Yes Historical Provider, MD  nitroGLYCERIN (NITROSTAT) 0.4 MG SL tablet Place 1 tablet (0.4 mg total) under the tongue every 5 (five) minutes x 3 doses as needed for chest pain. 03/23/16  Yes Liliane Shi, PA-C  rosuvastatin (CRESTOR) 20 MG tablet TAKE 1 TABLET BY MOUTH AT BEDTIME 04/21/16  Yes Fay Records, MD  valsartan-hydrochlorothiazide (DIOVAN HCT) 160-12.5 MG tablet Take 1  tablet by mouth daily. 07/11/15  Yes Fay Records, MD    Physical Exam:  Constitutional: NAD, calm, comfortable Vitals:   10/18/16 1713 10/18/16 1730 10/18/16 1800 10/18/16 1830  BP: (!) 151/66 127/66 (!) 179/82 (!) 152/64  Pulse: 71 75 91 79  Resp: 15 13 13 13   Temp:      TempSrc:      SpO2: 100% 96% 100% 96%   Eyes: PERRL, lids and conjunctivae normal ENMT: Mucous membranes are moist. Posterior pharynx clear of any exudate or lesions.Normal dentition.  Neck: normal, supple, no masses, no thyromegaly Respiratory: clear to auscultation bilaterally, no wheezing, no crackles. Normal respiratory effort. No accessory muscle use.  Cardiovascular: Regular rate and rhythm, no murmurs / rubs / gallops. No extremity edema. 2+ pedal pulses. No carotid bruits.  Abdomen: no tenderness, no masses palpated. No hepatosplenomegaly. Bowel sounds positive.  Musculoskeletal: no clubbing / cyanosis. No joint deformity upper and lower extremities. Good ROM, no contractures. Normal muscle tone.  Skin: no rashes, lesions, ulcers. No induration Neurologic: CN 2-12 grossly intact. Sensation intact, DTR normal. Strength 5/5 in all 4.  Psychiatric: Normal judgment and insight. Alert and oriented x 3. Normal mood.     Labs on Admission: I have personally reviewed following labs and imaging studies  CBC:  Recent Labs Lab 10/18/16 1710  WBC 9.2  HGB 11.4*  HCT 36.5  MCV 86.9  PLT 166   Basic Metabolic Panel:  Recent Labs Lab 10/18/16 1710  NA 141  K 3.5  CL 103  CO2 31  GLUCOSE 90  BUN 13  CREATININE 0.84  CALCIUM 9.4   GFR: CrCl cannot be calculated (Unknown ideal weight.). Liver Function Tests: No results for input(s): AST, ALT, ALKPHOS, BILITOT, PROT, ALBUMIN in the last 168 hours. No results for input(s): LIPASE, AMYLASE in the last 168 hours. No results for input(s): AMMONIA in the last 168 hours. Coagulation Profile: No results for input(s): INR, PROTIME in the last 168  hours. Cardiac Enzymes: No results for input(s): CKTOTAL, CKMB, CKMBINDEX, TROPONINI in the last 168 hours. BNP (last 3 results) No results for input(s): PROBNP in the last 8760 hours. HbA1C: No results for input(s): HGBA1C in the last 72 hours. CBG: No results for input(s): GLUCAP in the last 168 hours. Lipid Profile: No results for input(s): CHOL, HDL, LDLCALC, TRIG, CHOLHDL, LDLDIRECT in the last 72 hours. Thyroid Function Tests: No results for input(s): TSH, T4TOTAL, FREET4, T3FREE, THYROIDAB in the last 72 hours. Anemia Panel: No results for input(s): VITAMINB12, FOLATE, FERRITIN, TIBC, IRON, RETICCTPCT in the last 72 hours. Urine analysis:    Component Value Date/Time   COLORURINE YELLOW 02/25/2016 1400   APPEARANCEUR CLEAR 02/25/2016 1400   LABSPEC 1.012 02/25/2016 1400   PHURINE 7.5 02/25/2016 1400   GLUCOSEU NEGATIVE 02/25/2016 1400   HGBUR NEGATIVE 02/25/2016 1400   BILIRUBINUR NEGATIVE 02/25/2016 1400   KETONESUR NEGATIVE 02/25/2016 1400   PROTEINUR NEGATIVE 02/25/2016 1400   UROBILINOGEN 0.2 01/27/2015 1740   NITRITE NEGATIVE 02/25/2016 1400   LEUKOCYTESUR SMALL (A) 02/25/2016 1400   Sepsis Labs: No results found for this or any previous visit (from the past 240 hour(s)).   Radiological Exams on Admission: Dg Chest 2 View  Result Date: 10/18/2016 CLINICAL DATA:  Midline chest and left arm pain since noon today. Patient reports mild shortness of breath over the past 2 months. History of hypertension, hyperlipidemia, and coronary artery disease with stent placement. EXAM: CHEST  2 VIEW COMPARISON:  Chest x-ray of February 25, 2016 FINDINGS: The lungs are reasonably well inflated and clear. The heart is mildly enlarged but stable. The pulmonary vascularity is normal. The mediastinum is normal in width. There is calcification in the wall of the aortic arch. There is no pleural effusion. There is prominent thoracic kyphosis without compression fracture. IMPRESSION: Mild  stable cardiomegaly. No pulmonary edema, pneumonia, nor other acute cardiopulmonary abnormality. Thoracic aortic atherosclerosis. Electronically Signed   By: David  Martinique M.D.   On: 10/18/2016 15:55    EKG: Independently reviewed: Sinus rhythm with prolonged PR interval and prolonged QTC 492  Assessment/Plan Chest pain: Acute. Heart score = 4 - Admit to Telemetry bed - cycle CE q3 hr x3 and repeat her EKG in the am  - Nitroglycerin, Morphine, and aspirin   - Risk factor  stratification: will check FLP - 2d echo - please call Card in AM  Abnormal EKG: Patient presents with prolonged QTC of 492  - Check repeat EKG this a.m.    CAD s/p PCI - Continue aspirin and Plavix   HTN - Continue Diovan, metoprolol,  Hypothyroidism  - Continue levothyroxine    Hyperlipidemia - Crestor  Gerd with history of hiatal hernia - Continue Protonix  DVT prophylaxis: Lovenox  Code Status: Full Family Communication: Discussed plan of care with the patient and her son who is present at bedside Disposition Plan: Likely discharge home if work-up negative  Consults called: None  Admission status: Observation  Norval Morton MD [facility] Pager [phone]  If 7PM-7AM, please contact night-coverage [email or web address] Password [facility]  10/18/2016, 7:27 PM

## 2016-10-18 NOTE — ED Provider Notes (Signed)
[facility] DEPT Provider Note   CSN: [id number] Arrival date & time: 10/18/16  1415     History   Chief Complaint Chief Complaint  Patient presents with  . Chest Pain    HPI [name] is a 71 y.o. female.  HPI  71 y.o. female with a hx of HTN, HLD, CAD of native Coronary Artery, presents to the Emergency Department today complaining of chest pain with onset this afternoon. Pt states this occurred around 1230 while they were out at lunch. Notes that it feels like a "hard chest pain." with pressure to the central chest. Notes similar hx in past with stent placement. States that the pain lasted for around 1 hour and then dissipated after 2 NTG tablets given by [employer]. Notes no N/V. No diaphoresis. Did not left arm radiation after the 1st NTG that resolved after the 2nd NTG. Pt has hx 2 stens placed in 2012. No URI symptoms. No fevers. No cough. Denies pain currently. No other symptoms noted.   Cardiologist- Dr. Dorris Carnes  Past Medical History:  Diagnosis Date  . Anginal pain ([place])   . Carotid artery disease ([place])    a. Carotid US 9/17: R 1-39%; L 40-59% >> FU 1 year  . Coronary atherosclerosis of native coronary artery    a. DES to LCx in 07/2010 b. DES to RCA in 04/2013  . Exertional shortness of breath   . Heart murmur   . Hiatal hernia   . History of blood transfusion    "after daughter was born" (05/22/2013)  . HLD (hyperlipidemia)   . HTN (hypertension)   . Hypothyroidism   . Osteoporosis   . Palpitations   . Premature ventricular contractions   . Recurrent UTI    "q 4 months for the last couple years; last one was 12/2012" (05/22/2013)  . Sinus headache   . Tremors of nervous system    by dr. love    Patient Active Problem List   Diagnosis Date Noted  . Carotid artery disease ([place])   . Pain in the chest 02/25/2016  . Chest pain 01/27/2015  . S/P primary angioplasty with coronary stent 05/24/2013  . Hypokalemia 05/24/2013  . Hypothyroidism  08/14/2010  . Hyperlipidemia 08/14/2010  . Essential hypertension 08/14/2010  . CAD, NATIVE VESSEL 08/14/2010  . PREMATURE VENTRICULAR CONTRACTIONS 08/14/2010  . HIATAL HERNIA 08/14/2010    Past Surgical History:  Procedure Laterality Date  . APPENDECTOMY    . BREAST CYST ASPIRATION Bilateral    "numerous in the dr's office" (05/22/2013)  . BREAST CYST EXCISION Right 1965  . BREAST LUMPECTOMY Right 1980's   "benign" (05/22/2013)  . CARDIAC CATHETERIZATION  ~ 1968; 08/2010  . CORONARY ANGIOPLASTY WITH STENT PLACEMENT  07/2010  . CORONARY ANGIOPLASTY WITH STENT PLACEMENT  05/23/2013   Patent LCx stent (10-20% ISR), 70% mid RCA (FFR 0.73) s/p DES, 20% prox RCA, 40% ostial D1, 60% distal posterior AV branch; EF 65-70%  . ECTOPIC PREGNANCY SURGERY    . LAPAROTOMY  1965   "ruptured blood vessel in my side during childbirth; didn't find it til 2 days later; had to open me up to repair it" (05/22/2013)  . LEFT HEART CATHETERIZATION WITH CORONARY ANGIOGRAM N/A 05/23/2013   Procedure: LEFT HEART CATHETERIZATION WITH CORONARY ANGIOGRAM;  Surgeon: Wellington Hampshire, MD;  Location: [facility] CATH LAB;  Service: Cardiovascular;  Laterality: N/A;  . LEFT HEART CATHETERIZATION WITH CORONARY ANGIOGRAM N/A 01/28/2014   Procedure: LEFT HEART CATHETERIZATION WITH CORONARY  Cyril Loosen;  Surgeon: Blane Ohara, MD;  Location: [facility] CATH LAB;  Service: Cardiovascular;  Laterality: N/A;  . TONSILLECTOMY    . TOTAL ABDOMINAL HYSTERECTOMY      OB History    No data available       Home Medications    Prior to Admission medications   Medication Sig Start Date End Date Taking? Authorizing Provider  aspirin (ASPIR-81) 81 MG EC tablet Take 81 mg by mouth daily.      Historical Provider, MD  Calcium Carbonate (CALTRATE 600) 1500 MG TABS Take by mouth 2 (two) times daily.     Historical Provider, MD  cholecalciferol (VITAMIN D) 1000 UNITS tablet Take 1,000 Units by mouth daily.    Historical Provider, MD  clopidogrel  (PLAVIX) 75 MG tablet Take 1 tablet (75 mg total) by mouth daily. 03/23/16   Liliane Shi, PA-C  esomeprazole (NEXIUM) 40 MG capsule Take 40 mg by mouth daily.      Historical Provider, MD  levothyroxine (SYNTHROID, LEVOTHROID) 50 MCG tablet Take 50 mcg by mouth daily before breakfast.    Historical Provider, MD  metoprolol tartrate (LOPRESSOR) 25 MG tablet Take 0.5 tablets (12.5 mg total) by mouth 2 (two) times daily. 03/23/16   Liliane Shi, PA-C  Multiple Vitamins-Minerals (MULTIVITAL) tablet Take 1 tablet by mouth daily.      Historical Provider, MD  nitroGLYCERIN (NITROSTAT) 0.4 MG SL tablet Place 1 tablet (0.4 mg total) under the tongue every 5 (five) minutes x 3 doses as needed for chest pain. 03/23/16   Liliane Shi, PA-C  rosuvastatin (CRESTOR) 20 MG tablet TAKE 1 TABLET BY MOUTH AT BEDTIME 04/21/16   Fay Records, MD  valsartan-hydrochlorothiazide (DIOVAN HCT) 160-12.5 MG tablet Take 1 tablet by mouth daily. 07/11/15   Fay Records, MD    Family History Family History  Problem Relation Age of Onset  . CAD Mother   . Lymphoma Mother   . Heart disease Father   . Heart attack Brother 80  . Stroke Other     Social History Social History  Substance Use Topics  . Smoking status: Never Smoker  . Smokeless tobacco: Never Used  . Alcohol use No     Allergies   Flagyl [metronidazole hcl]; Atorvastatin; Codeine; Prednisone; Simvastatin; Sulfa antibiotics; Tamiflu [oseltamivir]; Metronidazole; and Penicillins   Review of Systems Review of Systems ROS reviewed and all are negative for acute change except as noted in the HPI.  Physical Exam Updated Vital Signs BP (!) 163/68   Pulse 85   Temp 98.6 F (37 C) (Oral)   Resp (!) 21   SpO2 99%   Physical Exam  Constitutional: She is oriented to person, place, and time. Vital signs are normal. She appears well-developed and well-nourished.  HENT:  Head: Normocephalic and atraumatic.  Right Ear: Hearing normal.  Left Ear:  Hearing normal.  Eyes: Conjunctivae and EOM are normal. Pupils are equal, round, and reactive to light.  Neck: Normal range of motion. Neck supple.  Cardiovascular: Normal rate, regular rhythm, normal heart sounds and intact distal pulses.   Pulmonary/Chest: Effort normal and breath sounds normal. No respiratory distress. She has no wheezes. She has no rales. She exhibits no tenderness.  Abdominal: Soft. There is no tenderness.  Musculoskeletal: Normal range of motion.  Neurological: She is alert and oriented to person, place, and time.  Skin: Skin is warm and dry.  Psychiatric: She has a normal mood and affect. Her speech is  normal and behavior is normal. Thought content normal.  Nursing note and vitals reviewed.  ED Treatments / Results  Labs (all labs ordered are listed, but only abnormal results are displayed) Labs Reviewed  CBC - Abnormal; Notable for the following:       Result Value   Hemoglobin 11.4 (*)    All other components within normal limits  BASIC METABOLIC PANEL  I-STAT TROPOININ, ED    EKG  EKG Interpretation  Date/Time:  Monday October 18 2016 14:28:32 EDT Ventricular Rate:  80 PR Interval:    QRS Duration: 90 QT Interval:  406 QTC Calculation: 469 R Axis:   44 Text Interpretation:  Sinus rhythm Abnormal R-wave progression, early transition Borderline repolarization abnormality since last tracing no significant change Confirmed by Eulis Foster  MD, ELLIOTT [phone]) on 10/18/2016 6:04:42 PM       EKG Interpretation  Date/Time:  Monday October 18 2016 17:58:59 EDT Ventricular Rate:  93 PR Interval:    QRS Duration: 90 QT Interval:  395 QTC Calculation: 492 R Axis:   36 Text Interpretation:  Sinus rhythm Prolonged PR interval Abnormal R-wave progression, early transition Minimal ST depression, anterolateral leads Borderline prolonged QT interval Since last tracing of earlier today No significant change was found Confirmed by Eulis Foster  MD, ELLIOTT [phone]) on 10/18/2016  6:06:12 PM        Radiology Dg Chest 2 View  Result Date: 10/18/2016 CLINICAL DATA:  Midline chest and left arm pain since noon today. Patient reports mild shortness of breath over the past 2 months. History of hypertension, hyperlipidemia, and coronary artery disease with stent placement. EXAM: CHEST  2 VIEW COMPARISON:  Chest x-ray of February 25, 2016 FINDINGS: The lungs are reasonably well inflated and clear. The heart is mildly enlarged but stable. The pulmonary vascularity is normal. The mediastinum is normal in width. There is calcification in the wall of the aortic arch. There is no pleural effusion. There is prominent thoracic kyphosis without compression fracture. IMPRESSION: Mild stable cardiomegaly. No pulmonary edema, pneumonia, nor other acute cardiopulmonary abnormality. Thoracic aortic atherosclerosis. Electronically Signed   By: David  Martinique M.D.   On: 10/18/2016 15:55    Procedures Procedures (including critical care time)  Medications Ordered in ED Medications - No data to display   Initial Impression / Assessment and Plan / ED Course  I have reviewed the triage vital signs and the nursing notes.  Pertinent labs & imaging results that were available during my care of the patient were reviewed by me and considered in my medical decision making (see chart for details).  Final Clinical Impressions(s) / ED Diagnoses  {I have reviewed and evaluated the relevant laboratory values. {I have reviewed and evaluated the relevant imaging studies. {I have interpreted the relevant EKG. {I have reviewed the relevant previous healthcare records.  {I obtained HPI from historian. {Patient discussed with supervising physician.  ED Course:  Assessment: Pt is a 71 y.o. female presents with CP with onset around 1230. Occurred at rest. Noted duration x 1 hour. Relief after x 2 NTG. ASA given en route. Risk Factors HTN, HLD, CAD of native Coronary Artery. x2 Stents placed in 2102. Concern for  cardiac etiology of Chest Pain. Medicine has been consulted and will see patient in the ED for likely admit. Pt does not meet criteria for CP protocol and a further evaluation is recommended. Pt has been re-evaluated prior to consult and VSS, NAD, heart RRR, pain 0/10, lungs CTAB. No acute abnormalities found  on EKG and first round of cardiac enzymes negative.   Disposition/Plan:  Admit Pt acknowledges and agrees with plan  Supervising Physician Daleen Bo, MD  Final diagnoses:  Chest pain, unspecified type    New Prescriptions New Prescriptions   No medications on file     Shary Decamp, PA-C 10/18/16 1856    Daleen Bo, MD 10/19/16 1022

## 2016-10-18 NOTE — ED Notes (Addendum)
Pt arrived via [facility] after experiencing chest pain with radiation to the left arm. 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 EMS was called and transported pt to ED. A second nitro was given en route along with 3x81mg  ASA. Per pt,  Pt pain was reduced after second nitro, given a score of 2/10 upon arrival to ED. Pt was alert and oriented x4, ambulated well and had no other complaints upon arrival.

## 2016-10-18 NOTE — ED Notes (Signed)
Admitting Provider at bedside. 

## 2016-10-18 NOTE — Progress Notes (Signed)
Pt arrived to 2w10 from ED. Ambulated to bed. Tele box placed, CCMD verified x2. Pt denies any SOB or chest pain. Pt updated on plan of care, oriented to room and call bell. Will continue to monitor.  Jaymes Graff, RN

## 2016-10-18 NOTE — ED Notes (Addendum)
Pt c/o feeling weak and "just not right". Denies SOB or nausea. EKG performed and EDP notified.

## 2016-10-19 ENCOUNTER — Encounter (HOSPITAL_COMMUNITY): Payer: Self-pay | Admitting: General Practice

## 2016-10-19 DIAGNOSIS — Z7902 Long term (current) use of antithrombotics/antiplatelets: Secondary | ICD-10-CM | POA: Diagnosis not present

## 2016-10-19 DIAGNOSIS — M810 Age-related osteoporosis without current pathological fracture: Secondary | ICD-10-CM | POA: Diagnosis not present

## 2016-10-19 DIAGNOSIS — K219 Gastro-esophageal reflux disease without esophagitis: Secondary | ICD-10-CM | POA: Diagnosis not present

## 2016-10-19 DIAGNOSIS — I2510 Atherosclerotic heart disease of native coronary artery without angina pectoris: Secondary | ICD-10-CM | POA: Diagnosis not present

## 2016-10-19 DIAGNOSIS — Z955 Presence of coronary angioplasty implant and graft: Secondary | ICD-10-CM | POA: Diagnosis not present

## 2016-10-19 DIAGNOSIS — R079 Chest pain, unspecified: Secondary | ICD-10-CM | POA: Diagnosis not present

## 2016-10-19 DIAGNOSIS — E039 Hypothyroidism, unspecified: Secondary | ICD-10-CM | POA: Diagnosis not present

## 2016-10-19 DIAGNOSIS — E785 Hyperlipidemia, unspecified: Secondary | ICD-10-CM | POA: Diagnosis not present

## 2016-10-19 DIAGNOSIS — Z79899 Other long term (current) drug therapy: Secondary | ICD-10-CM | POA: Diagnosis not present

## 2016-10-19 DIAGNOSIS — Z7982 Long term (current) use of aspirin: Secondary | ICD-10-CM | POA: Diagnosis not present

## 2016-10-19 DIAGNOSIS — R9431 Abnormal electrocardiogram [ECG] [EKG]: Secondary | ICD-10-CM | POA: Diagnosis present

## 2016-10-19 DIAGNOSIS — I10 Essential (primary) hypertension: Secondary | ICD-10-CM | POA: Diagnosis not present

## 2016-10-19 DIAGNOSIS — R072 Precordial pain: Secondary | ICD-10-CM | POA: Diagnosis not present

## 2016-10-19 LAB — LIPID PANEL
Cholesterol: 164 mg/dL (ref 0–200)
HDL: 46 mg/dL (ref >40)
LDL CALC: 66 mg/dL (ref 0–99)
Total CHOL/HDL Ratio: 3.6 RATIO
Triglycerides: 260 mg/dL — ABNORMAL HIGH (ref <150)
VLDL: 52 mg/dL — AB (ref 0–40)

## 2016-10-19 LAB — CBC WITH DIFFERENTIAL/PLATELET
BASOS ABS: 0 10*3/uL (ref 0.0–0.1)
BASOS PCT: 0 %
EOS ABS: 0.2 10*3/uL (ref 0.0–0.7)
EOS PCT: 2 %
HCT: 36.3 % (ref 36.0–46.0)
Hemoglobin: 11.5 g/dL — ABNORMAL LOW (ref 12.0–15.0)
LYMPHS PCT: 30 %
Lymphs Abs: 2.7 10*3/uL (ref 0.7–4.0)
MCH: 27.8 pg (ref 26.0–34.0)
MCHC: 31.7 g/dL (ref 30.0–36.0)
MCV: 87.7 fL (ref 78.0–100.0)
Monocytes Absolute: 0.5 10*3/uL (ref 0.1–1.0)
Monocytes Relative: 6 %
Neutro Abs: 5.5 10*3/uL (ref 1.7–7.7)
Neutrophils Relative %: 62 %
PLATELETS: 248 10*3/uL (ref 150–400)
RBC: 4.14 MIL/uL (ref 3.87–5.11)
RDW: 13.9 % (ref 11.5–15.5)
WBC: 9 10*3/uL (ref 4.0–10.5)

## 2016-10-19 LAB — BASIC METABOLIC PANEL
Anion gap: 10 (ref 5–15)
BUN: 9 mg/dL (ref 6–20)
CO2: 28 mmol/L (ref 22–32)
CREATININE: 0.71 mg/dL (ref 0.44–1.00)
Calcium: 8.8 mg/dL — ABNORMAL LOW (ref 8.9–10.3)
Chloride: 103 mmol/L (ref 101–111)
GFR calc Af Amer: >60 mL/min (ref >60)
Glucose, Bld: 94 mg/dL (ref 65–99)
POTASSIUM: 3.5 mmol/L (ref 3.5–5.1)
SODIUM: 141 mmol/L (ref 135–145)

## 2016-10-19 LAB — TROPONIN I: Troponin I: <0.03 ng/mL (ref <0.03)

## 2016-10-19 MED ORDER — METOPROLOL TARTRATE 25 MG PO TABS
25.0000 mg | ORAL_TABLET | Freq: Two times a day (BID) | ORAL | Status: DC
  Filled 2016-10-19: qty 1

## 2016-10-19 NOTE — Consult Note (Signed)
CARDIOLOGY CONSULT NOTE   Patient ID: [name] MRN: [id number] DOB/AGE: May 01, 1946 71 y.o.  Admit date: 10/18/2016  Requesting Physician: Dr. [place] Pink Primary Physician:   Melinda Crutch, MD Primary Cardiologist: Dr. Harrington Challenger Reason for Consultation:  Chest pain.   HPI: [name] is a 71 y.o. female with a history of CAD with prior DES to [place] (2012), s/p DES to RCA in (2014), carotid artery disease, HTN, and HLD who presented to [facility] on 10/18/16 with chest pain.   LHC in 7/15 demonstrated patent stents in LCx and RCA.She was admitted in 8/2- 02/27/16 with chest pain. MI was ruled out.  Inpatient Myoview was low risk and neg for ischemia.  She was in her usual state of health until yesterday when she had some chest pain with mild nausea. She took some tums and felt okay. She then went to lunch and had some more chest pain and generally unwell. They got in the car to come to the hospital. Then her left arm started to hurt so [place] where she was given some SL NTG with some relief. The paramedics were called from there and she was brought here. Apparently she had some ventricular ectopy while en route. She is pretty active and walks around [employer] and [employer] and does okay. About a month ago, she had some significant fatigue after walking around [employer]. She has been chest pain free since admission but had some diaphoresis and pulsating last night. Now she just has a HA. No SOB but sometimes she feels like she can't catch her breath. No LE edema, orthopnea or PND. No dizziness or syncope. She does take nexium everyday for her hiatal hernia.      Past Medical History:  Diagnosis Date  . Anginal pain ([place])   . Carotid artery disease ([place])    a. Carotid US 9/17: R 1-39%; L 40-59% >> FU 1 year  . Coronary atherosclerosis of native coronary artery    a. DES to LCx in 07/2010 b. DES to RCA in 04/2013  . Exertional shortness of breath   . Heart murmur   . Hiatal  hernia   . History of blood transfusion    "after daughter was born" (05/22/2013)  . HLD (hyperlipidemia)   . HTN (hypertension)   . Hypothyroidism   . Osteoporosis   . Palpitations   . Premature ventricular contractions   . Recurrent UTI    "q 4 months for the last couple years; last one was 12/2012" (05/22/2013)  . Sinus headache   . Tremors of nervous system    by dr. love     Past Surgical History:  Procedure Laterality Date  . APPENDECTOMY    . BREAST CYST ASPIRATION Bilateral    "numerous in the dr's office" (05/22/2013)  . BREAST CYST EXCISION Right 1965  . BREAST LUMPECTOMY Right 1980's   "benign" (05/22/2013)  . CARDIAC CATHETERIZATION  ~ 1968; 08/2010  . CORONARY ANGIOPLASTY WITH STENT PLACEMENT  07/2010  . CORONARY ANGIOPLASTY WITH STENT PLACEMENT  05/23/2013   Patent LCx stent (10-20% ISR), 70% mid RCA (FFR 0.73) s/p DES, 20% prox RCA, 40% ostial D1, 60% distal posterior AV branch; EF 65-70%  . ECTOPIC PREGNANCY SURGERY    . LAPAROTOMY  1965   "ruptured blood vessel in my side during childbirth; didn't find it til 2 days later; had to open me up to repair it" (05/22/2013)  . LEFT HEART CATHETERIZATION WITH  CORONARY ANGIOGRAM N/A 05/23/2013   Procedure: LEFT HEART CATHETERIZATION WITH CORONARY ANGIOGRAM;  Surgeon: Wellington Hampshire, MD;  Location: [facility] CATH LAB;  Service: Cardiovascular;  Laterality: N/A;  . LEFT HEART CATHETERIZATION WITH CORONARY ANGIOGRAM N/A 01/28/2014   Procedure: LEFT HEART CATHETERIZATION WITH CORONARY ANGIOGRAM;  Surgeon: Blane Ohara, MD;  Location: [facility] CATH LAB;  Service: Cardiovascular;  Laterality: N/A;  . TONSILLECTOMY    . TOTAL ABDOMINAL HYSTERECTOMY      Allergies  Allergen Reactions  . Atorvastatin     REACTION: myaligia  . Codeine Hives  . Flagyl [Metronidazole Hcl] Anaphylaxis  . Simvastatin Other (See Comments)    REACTION: myalgia  . Sulfa Antibiotics Other (See Comments)    Jittery  . Tamiflu [Oseltamivir] Itching  .  Metronidazole Nausea And Vomiting and Rash  . Penicillins Nausea And Vomiting and Rash  . Prednisone Palpitations    Makes my heart race    I have reviewed the patient's current medications . aspirin EC  81 mg Oral Daily  . calcium carbonate  1,250 mg Oral BID WC  . clopidogrel  75 mg Oral Daily  . enoxaparin (LOVENOX) injection  40 mg Subcutaneous Q24H  . irbesartan  150 mg Oral Daily   And  . hydrochlorothiazide  12.5 mg Oral Daily  . levothyroxine  50 mcg Oral QAC breakfast  . metoprolol tartrate  12.5 mg Oral BID  . multivitamin with minerals  1 tablet Oral Daily  . pantoprazole  40 mg Oral Daily  . rosuvastatin  20 mg Oral QHS   . sodium chloride 75 mL/hr at 10/18/16 2210   acetaminophen, gi cocktail, morphine injection, nitroGLYCERIN, ondansetron (ZOFRAN) IV  Prior to Admission medications   Medication Sig Start Date End Date Taking? Authorizing Provider  aspirin (ASPIR-81) 81 MG EC tablet Take 81 mg by mouth daily.     Yes Historical Provider, MD  Calcium Carbonate (CALTRATE 600) 1500 MG TABS Take by mouth 2 (two) times daily.    Yes Historical Provider, MD  cholecalciferol (VITAMIN D) 1000 UNITS tablet Take 1,000 Units by mouth daily.   Yes Historical Provider, MD  clopidogrel (PLAVIX) 75 MG tablet Take 1 tablet (75 mg total) by mouth daily. 03/23/16  Yes Scott Joylene Draft, PA-C  esomeprazole (NEXIUM) 40 MG capsule Take 40 mg by mouth daily.     Yes Historical Provider, MD  levothyroxine (SYNTHROID, LEVOTHROID) 50 MCG tablet Take 50 mcg by mouth daily before breakfast.   Yes Historical Provider, MD  metoprolol tartrate (LOPRESSOR) 25 MG tablet Take 0.5 tablets (12.5 mg total) by mouth 2 (two) times daily. 03/23/16  Yes Liliane Shi, PA-C  Multiple Vitamins-Minerals (MULTIVITAL) tablet Take 1 tablet by mouth daily.     Yes Historical Provider, MD  nitroGLYCERIN (NITROSTAT) 0.4 MG SL tablet Place 1 tablet (0.4 mg total) under the tongue every 5 (five) minutes x 3 doses as needed  for chest pain. 03/23/16  Yes Liliane Shi, PA-C  rosuvastatin (CRESTOR) 20 MG tablet TAKE 1 TABLET BY MOUTH AT BEDTIME 04/21/16  Yes Fay Records, MD  valsartan-hydrochlorothiazide (DIOVAN HCT) 160-12.5 MG tablet Take 1 tablet by mouth daily. 07/11/15  Yes Fay Records, MD     Social History   Social History  . Marital status: Married    Spouse name: N/A  . Number of children: N/A  . Years of education: N/A   Occupational History  . Retired Oceanographer    Social History Main Topics  .  Smoking status: Never Smoker  . Smokeless tobacco: Never Used  . Alcohol use No  . Drug use: No  . Sexual activity: Not on file   Other Topics Concern  . Not on file   Social History Narrative  . No narrative on file    Family Status  Relation Status  . Mother Deceased   died of lymphoma, hx MI  . Father Deceased   Hx CABG, PPM  . Brother Alive   Hx MI/CABG age 43  . Brother Alive  . Other    Family History  Problem Relation Age of Onset  . CAD Mother   . Lymphoma Mother   . Heart disease Father   . Heart attack Brother 11  . Stroke Other     ROS:  Full 14 point review of systems complete and found to be negative unless listed above.  Physical Exam: Blood pressure (!) 131/91, pulse 64, temperature 98.2 F (36.8 C), temperature source Oral, resp. rate 16, height _0  (1.575 m), weight 137 lb 14.4 oz (62.6 kg), SpO2 99 %.  General: Well developed, well nourished, female in no acute distress Head: Eyes PERRLA, No xanthomas.  Lungs: CTAB Heart: HRRR S1 S2, no rub/gallop, Heart regular rate and rhythm with S1, S2, no murmur. pulses are 2+ extrem.   Neck: No carotid bruits. No lymphadenopathy.  No JVD. Abdomen: Bowel sounds present, abdomen soft and non-tender without masses or hernias noted. Msk:  No spine or cva tenderness. No weakness, no joint deformities or effusions. Extremities: No clubbing or cyanosis. No LE edema.  Neuro: Alert and oriented X 3. No focal deficits  noted. Psych:  Good affect, responds appropriately Skin: No rashes or lesions noted.  Labs:   Lab Results  Component Value Date   WBC 9.0 10/19/2016   HGB 11.5 (L) 10/19/2016   HCT 36.3 10/19/2016   MCV 87.7 10/19/2016   PLT 248 10/19/2016   No results for input(s): INR in the last 72 hours.  Recent Labs Lab 10/19/16 0352  NA 141  K 3.5  CL 103  CO2 28  BUN 9  CREATININE 0.71  CALCIUM 8.8*  GLUCOSE 94   Magnesium  Date Value Ref Range Status  10/18/2016 2.2 1.7 - 2.4 mg/dL Final    Recent Labs  10/18/16 2043 10/18/16 2321 10/19/16 0352  TROPONINI <0.03 <0.03 <0.03    Recent Labs  10/18/16 1833  TROPIPOC 0.00   Pro B Natriuretic peptide (BNP)  Date/Time Value Ref Range Status  05/22/2013 01:25 PM 202.9 (H) 0 - 125 pg/mL Final  08/23/2010 02:03 AM 118.0 (H) 0.0 - 100.0 pg/mL Final   Lab Results  Component Value Date   CHOL 164 10/19/2016   HDL 46 10/19/2016   LDLCALC 66 10/19/2016   TRIG 260 (H) 10/19/2016   Lab Results  Component Value Date   DDIMER  08/22/2010    0.23        AT THE INHOUSE ESTABLISHED CUTOFF VALUE OF 0.48 ug/mL FEU, THIS ASSAY HAS BEEN DOCUMENTED IN THE LITERATURE TO HAVE A SENSITIVITY AND NEGATIVE PREDICTIVE VALUE OF AT LEAST 98 TO 99%.  THE TEST RESULT SHOULD BE CORRELATED WITH AN ASSESSMENT OF THE CLINICAL PROBABILITY OF DVT / VTE.   Lipase  Date/Time Value Ref Range Status  09/11/2015 02:55 PM 201 (H) 11 - 51 U/L Final   TSH  Date/Time Value Ref Range Status  01/27/2015 05:45 PM 4.519 (H) 0.350 - 4.500 uIU/mL Final    Comment:  Performed at [facility]  05/22/2013 07:30 PM 1.896 0.350 - 4.500 uIU/mL Final    Comment:    Performed at [facility]   No results found for: VITAMINB12, FOLATE, FERRITIN, TIBC, IRON, RETICCTPCT  Myoview 02/26/16 IMPRESSION: 1. No reversible ischemia or infarction. 2. Normal left ventricular wall motion. 3. Left ventricular ejection fraction 80% 4. Non invasive  risk stratification*: Low  Echo 7/16 Mild LVH, EF 60-65%, normal wall motion, grade 1 diastolic dysfunction, mild MR, MAC, PASP 32 mmHg  LHC 7/15 LM ok LAD okay LCx proximal stent patent with 20% ISR RCA mid stent patent, posterior AV segment 50% EF 55-65%  LHC 10/14 RCA mid 60-70% PCI: 3.0 x 15 mm Xience drug-eluting stent to mid RCA   ECG:  NSR with TWI in I and AVL   Radiology:  Dg Chest 2 View  Result Date: 10/18/2016 CLINICAL DATA:  Midline chest and left arm pain since noon today. Patient reports mild shortness of breath over the past 2 months. History of hypertension, hyperlipidemia, and coronary artery disease with stent placement. EXAM: CHEST  2 VIEW COMPARISON:  Chest x-ray of February 25, 2016 FINDINGS: The lungs are reasonably well inflated and clear. The heart is mildly enlarged but stable. The pulmonary vascularity is normal. The mediastinum is normal in width. There is calcification in the wall of the aortic arch. There is no pleural effusion. There is prominent thoracic kyphosis without compression fracture. IMPRESSION: Mild stable cardiomegaly. No pulmonary edema, pneumonia, nor other acute cardiopulmonary abnormality. Thoracic aortic atherosclerosis. Electronically Signed   By: David  Martinique M.D.   On: 10/18/2016 15:55    ASSESSMENT AND PLAN:    Principal Problem:   Chest pain Active Problems:   Hypothyroidism   Hyperlipidemia   Essential hypertension   CAD, NATIVE VESSEL   Abnormal EKG  [name] is a 71 y.o. female with a history of CAD with prior DES to [place] (2012), s/p DES to RCA in (2014), HTN, HLD who presented to [facility] on 10/18/16 with chest pain.   Chest pain: no objective evidence of ischemia. Troponin neg x3 and ECG with no acute ST/TW changes. She had a low risk nuclear stress test less than a year ago in 02/2016. She is currently chest pain free and would like to eat. She thinks pain could have been related to hiatal hernia. She has  previously arranged follow up with Dr. Harrington Challenger on 4/9. I have recommended that she keep that appointment.  CAD: continue ASA, plavix, BB and statin   HTN: BP has been elevated during admission. HRs in 70-90s with PVCs. Would increase Lopressor from 12.44m BID to 260mmg BID.   HLD: LDL at goal (66). Continue statin   Carotid artery disease: carotid [place] mild plaque on the R (1-39%) and mod plaque on the L (40-59%). Plan is for repeat studies in 03/2017  Signed: KaAngelena FormPA-C 10/19/2016 11:03 AM  Pager [phone]-Sign MD  Patient seen, examined. Available data reviewed. Agree with findings, assessment, and plan as outlined by KaAngelena FormPA-C. The patient is interviewed with her husband at the bedside. She is alert and oriented, no distress. JVP is normal. Lungs are clear to auscultation bilaterally. Heart is regular rate and rhythm without murmur or gallop. Abdomen is soft and nontender. Extremities are without edema. Telemetry shows normal sinus rhythm with occasional PVCs. Serial troponins are negative. EKG reveals normal sinus rhythm with rightward axis and no significant ST or T-wave  changes. The patient had an isolated episode of chest pain associated with eating. She has a known hiatal hernia. While she does have coronary disease and previous stenting, she has had normal Myoview scans, most recently within the last year. Her last heart catheterization in 2015 documented patency at her stent sites. Overall findings are reassuring. She has follow-up scheduled with Dr. Harrington Challenger on April 9 and is advised to keep this. If she develops exertional angina or recurring resting symptoms would consider repeat coronary angiography. She appears stable for discharge.  Sherren Mocha, M.D. 10/19/2016 11:50 AM

## 2016-10-19 NOTE — Discharge Summary (Signed)
[facility]  Physician Discharge Summary   Patient ID: [name] MRN: [id number] DOB/AGE: 02/26/1946 71 y.o.  Admit date: 10/18/2016 Discharge date: 10/19/2016  PCP: Melinda Crutch, MD  DISCHARGE DIAGNOSES:  Principal Problem:   Chest pain Active Problems:   Hypothyroidism   Hyperlipidemia   Essential hypertension   CAD, NATIVE VESSEL   Abnormal EKG   RECOMMENDATIONS FOR OUTPATIENT FOLLOW UP: 1. Outpatient follow-up with cardiology. Appointment on April 9.  DISCHARGE CONDITION: fair  Diet recommendation: As before  [facility] Weights   10/18/16 2042  Weight: 62.6 kg (137 lb 14.4 oz)    INITIAL HISTORY: 71 year old Caucasian female with a past medical history of hypertension, hyperlipidemia, coronary artery disease, status post stents in 2015 with low-risk stress test in 2017, presented with onset of chest pain around 12:30 PM on the day of admission. She reported left arm discomfort as well. Patient took nitroglycerin without much improvement. She was hospitalized for further management.  Consultations:  Cardiology  Procedures:  None  HOSPITAL COURSE:    Patient was hospitalized for further management of chest pain. Heart score was 4. Patient does have known history of CAD. However, she has had a low-risk stress test in 2017. Cardiology was consulted to assist with management. Patient was chest pain-free this morning. She ruled out for acute artery syndrome with serial cardiac enzymes. Cardiology determined that she can pursue outpatient follow-up with her primary cardiologist to determine further workup. Patient has an appointment with Dr. Harrington Challenger on April 9. Patient is agreeable with this approach. She is considered stable for discharge.  Her other medical issues including hypertension, hyperlipidemia, hypothyroidism, GERD and history of hiatal hernia are all stable.  Okay for discharge home.   PERTINENT LABS:  The results of significant diagnostics from  this hospitalization (including imaging, microbiology, ancillary and laboratory) are listed below for reference.     Labs: Basic Metabolic Panel:  Recent Labs Lab 10/18/16 1710 10/18/16 2043 10/19/16 0352  NA 141  --  141  K 3.5  --  3.5  CL 103  --  103  CO2 31  --  28  GLUCOSE 90  --  94  BUN 13  --  9  CREATININE 0.84  --  0.71  CALCIUM 9.4  --  8.8*  MG  --  2.2  --    CBC:  Recent Labs Lab 10/18/16 1710 10/19/16 0352  WBC 9.2 9.0  NEUTROABS  --  5.5  HGB 11.4* 11.5*  HCT 36.5 36.3  MCV 86.9 87.7  PLT 257 248   Cardiac Enzymes:  Recent Labs Lab 10/18/16 2043 10/18/16 2321 10/19/16 0352  TROPONINI <0.03 <0.03 <0.03    IMAGING STUDIES Dg Chest 2 View  Result Date: 10/18/2016 CLINICAL DATA:  Midline chest and left arm pain since noon today. Patient reports mild shortness of breath over the past 2 months. History of hypertension, hyperlipidemia, and coronary artery disease with stent placement. EXAM: CHEST  2 VIEW COMPARISON:  Chest x-ray of February 25, 2016 FINDINGS: The lungs are reasonably well inflated and clear. The heart is mildly enlarged but stable. The pulmonary vascularity is normal. The mediastinum is normal in width. There is calcification in the wall of the aortic arch. There is no pleural effusion. There is prominent thoracic kyphosis without compression fracture. IMPRESSION: Mild stable cardiomegaly. No pulmonary edema, pneumonia, nor other acute cardiopulmonary abnormality. Thoracic aortic atherosclerosis. Electronically Signed   By: David  Martinique M.D.   On: 10/18/2016 15:55  DISCHARGE EXAMINATION: Vitals:   10/18/16 2042 10/19/16 0430 10/19/16 1157 10/19/16 1317  BP: (!) 171/71 (!) 131/91 (!) 135/91 (!) 94/53  Pulse: 72 64 65 75  Resp:  16  20  Temp: 97.9 F (36.6 C) 98.2 F (36.8 C)  98.4 F (36.9 C)  TempSrc: Oral Oral  Oral  SpO2: 100% 99%  100%  Weight: 62.6 kg (137 lb 14.4 oz)     Height: 5\' 2"  (1.575 m)      General appearance:  alert, cooperative, appears stated age and no distress Resp: clear to auscultation bilaterally Cardio: regular rate and rhythm, S1, S2 normal, no murmur, click, rub or gallop. No edema GI: soft, non-tender; bowel sounds normal; no masses,  no organomegaly  DISPOSITION: Home  Discharge Instructions    Call MD for:  difficulty breathing, headache or visual disturbances    Complete by:  As directed    Call MD for:  extreme fatigue    Complete by:  As directed    Call MD for:  persistant dizziness or light-headedness    Complete by:  As directed    Call MD for:  persistant nausea and vomiting    Complete by:  As directed    Call MD for:  severe uncontrolled pain    Complete by:  As directed    Call MD for:  temperature >100.4    Complete by:  As directed    Discharge instructions    Complete by:  As directed    Please be sure to keep her appointment with your cardiologist. Please seek attention immediately if you again develop chest pain or shortness of breath or develop lightheadedness or dizziness.  You were cared for by a hospitalist during your hospital stay. If you have any questions about your discharge medications or the care you received while you were in the hospital after you are discharged, you can call the unit and asked to speak with the hospitalist on call if the hospitalist that took care of you is not available. Once you are discharged, your primary care physician will handle any further medical issues. Please note that NO REFILLS for any discharge medications will be authorized once you are discharged, as it is imperative that you return to your primary care physician (or establish a relationship with a primary care physician if you do not have one) for your aftercare needs so that they can reassess your need for medications and monitor your lab values. If you do not have a primary care physician, you can call [phone] for a physician referral.   Increase activity slowly     Complete by:  As directed       ALLERGIES:  Allergies  Allergen Reactions  . Atorvastatin     REACTION: myaligia  . Codeine Hives  . Flagyl [Metronidazole Hcl] Anaphylaxis  . Simvastatin Other (See Comments)    REACTION: myalgia  . Sulfa Antibiotics Other (See Comments)    Jittery  . Tamiflu [Oseltamivir] Itching  . Metronidazole Nausea And Vomiting and Rash  . Penicillins Nausea And Vomiting and Rash  . Prednisone Palpitations    Makes my heart race     Current Discharge Medication List    CONTINUE these medications which have NOT CHANGED   Details  aspirin (ASPIR-81) 81 MG EC tablet Take 81 mg by mouth daily.      Calcium Carbonate (CALTRATE 600) 1500 MG TABS Take by mouth 2 (two) times daily.     cholecalciferol (VITAMIN  D) 1000 UNITS tablet Take 1,000 Units by mouth daily.    clopidogrel (PLAVIX) 75 MG tablet Take 1 tablet (75 mg total) by mouth daily. Qty: 90 tablet, Refills: 3    esomeprazole (NEXIUM) 40 MG capsule Take 40 mg by mouth daily.      levothyroxine (SYNTHROID, LEVOTHROID) 50 MCG tablet Take 50 mcg by mouth daily before breakfast.    metoprolol tartrate (LOPRESSOR) 25 MG tablet Take 0.5 tablets (12.5 mg total) by mouth 2 (two) times daily. Qty: 90 tablet, Refills: 3    Multiple Vitamins-Minerals (MULTIVITAL) tablet Take 1 tablet by mouth daily.      nitroGLYCERIN (NITROSTAT) 0.4 MG SL tablet Place 1 tablet (0.4 mg total) under the tongue every 5 (five) minutes x 3 doses as needed for chest pain. Qty: 25 tablet, Refills: 3    rosuvastatin (CRESTOR) 20 MG tablet TAKE 1 TABLET BY MOUTH AT BEDTIME Qty: 90 tablet, Refills: 2    valsartan-hydrochlorothiazide (DIOVAN HCT) 160-12.5 MG tablet Take 1 tablet by mouth daily. Qty: 90 tablet, Refills: 3           TOTAL DISCHARGE TIME: 35 minutes  [facility] Pager [phone]  10/19/2016, 2:29 PM

## 2016-10-19 NOTE — Progress Notes (Signed)
Pt. Discharged to  Pt. D/C'd via  with Discharge information reviewed and given All personal belongings given to Pt.  Education discussed IV was d/c Tele d/c

## 2016-10-20 ENCOUNTER — Encounter: Payer: Self-pay | Admitting: Internal Medicine

## 2016-10-31 NOTE — Progress Notes (Signed)
Cardiology Office Note   Date:  11/01/2016   ID:  KATANA BERTHOLD, DOB [birth year], MRN [id number]  PCP:  Melinda Crutch, MD  Cardiologist:   Dorris Carnes, MD   F/U of CAD     History of Present Illness: [name] is a 71 y.o. female with a history of HTN and CAD   Last cath done in 2015 for CP  It showd patent stent to RCA  50% posterior AVsegment  Minimal dz in LAD LCx (nondominant) with long patent stent.  10 to 20% instent restenosis.   LVEF normal      I saw her in 2016  She was seen by Kathleen Argue in the interval Myovue in Aug 2017 showed normal perfusion  LVEF 80% Recently admitted with CP  Not felt to be cardiac     Current Outpatient Prescriptions  Medication Sig Dispense Refill  . aspirin (ASPIR-81) 81 MG EC tablet Take 81 mg by mouth daily.      . Calcium Carbonate (CALTRATE 600) 1500 MG TABS Take by mouth 2 (two) times daily.     . cholecalciferol (VITAMIN D) 1000 UNITS tablet Take 1,000 Units by mouth daily.    . clopidogrel (PLAVIX) 75 MG tablet Take 1 tablet (75 mg total) by mouth daily. 90 tablet 3  . esomeprazole (NEXIUM) 40 MG capsule Take 40 mg by mouth daily.      Marland Kitchen levothyroxine (SYNTHROID, LEVOTHROID) 50 MCG tablet Take 50 mcg by mouth daily before breakfast.    . metoprolol tartrate (LOPRESSOR) 25 MG tablet Take 0.5 tablets (12.5 mg total) by mouth 2 (two) times daily. 90 tablet 3  . Multiple Vitamins-Minerals (MULTIVITAL) tablet Take 1 tablet by mouth daily.      . nitroGLYCERIN (NITROSTAT) 0.4 MG SL tablet Place 1 tablet (0.4 mg total) under the tongue every 5 (five) minutes x 3 doses as needed for chest pain. 25 tablet 3  . rosuvastatin (CRESTOR) 20 MG tablet TAKE 1 TABLET BY MOUTH AT BEDTIME 90 tablet 2  . valsartan-hydrochlorothiazide (DIOVAN HCT) 160-12.5 MG tablet Take 1 tablet by mouth daily. 90 tablet 3   No current facility-administered medications for this visit.     Allergies:   Atorvastatin; Codeine; Flagyl [metronidazole hcl]; Simvastatin; Sulfa  antibiotics; Tamiflu [oseltamivir]; Metronidazole; Penicillins; and Prednisone   Past Medical History:  Diagnosis Date  . Anginal pain ([place])   . Carotid artery disease ([place])    a. Carotid US 9/17: R 1-39%; L 40-59% >> FU 1 year  . Coronary atherosclerosis of native coronary artery    a. DES to LCx in 07/2010 b. DES to RCA in 04/2013  . Exertional shortness of breath   . Heart murmur   . Hiatal hernia   . History of blood transfusion    "after daughter was born" (05/22/2013)  . HLD (hyperlipidemia)   . HTN (hypertension)   . Hypothyroidism   . Osteoporosis   . Palpitations   . Premature ventricular contractions   . Recurrent UTI    "q 4 months for the last couple years; last one was 12/2012" (05/22/2013)  . Sinus headache   . Tremors of nervous system    by dr. love    Past Surgical History:  Procedure Laterality Date  . APPENDECTOMY    . BREAST CYST ASPIRATION Bilateral    "numerous in the dr's office" (05/22/2013)  . BREAST CYST EXCISION Right 1965  . BREAST LUMPECTOMY Right 1980's   "benign" (05/22/2013)  .  CARDIAC CATHETERIZATION  ~ 1968; 08/2010  . CORONARY ANGIOPLASTY WITH STENT PLACEMENT  07/2010  . CORONARY ANGIOPLASTY WITH STENT PLACEMENT  05/23/2013   Patent LCx stent (10-20% ISR), 70% mid RCA (FFR 0.73) s/p DES, 20% prox RCA, 40% ostial D1, 60% distal posterior AV branch; EF 65-70%  . ECTOPIC PREGNANCY SURGERY    . LAPAROTOMY  1965   "ruptured blood vessel in my side during childbirth; didn't find it til 2 days later; had to open me up to repair it" (05/22/2013)  . LEFT HEART CATHETERIZATION WITH CORONARY ANGIOGRAM N/A 05/23/2013   Procedure: LEFT HEART CATHETERIZATION WITH CORONARY ANGIOGRAM;  Surgeon: Wellington Hampshire, MD;  Location: [facility] CATH LAB;  Service: Cardiovascular;  Laterality: N/A;  . LEFT HEART CATHETERIZATION WITH CORONARY ANGIOGRAM N/A 01/28/2014   Procedure: LEFT HEART CATHETERIZATION WITH CORONARY ANGIOGRAM;  Surgeon: Blane Ohara, MD;  Location:  [facility];  Service: Cardiovascular;  Laterality: N/A;  . TONSILLECTOMY    . TOTAL ABDOMINAL HYSTERECTOMY       Social History:  The patient  reports that she has never smoked. She has never used smokeless tobacco. She reports that she does not drink alcohol or use drugs.   Family History:  The patient's family history includes CAD in her mother; Heart attack (age of onset: 34) in her brother; Heart disease in her father; Lymphoma in her mother; Stroke in her other.    ROS:  Please see the history of present illness. All other systems are reviewed and  Negative to the above problem except as noted.    PHYSICAL EXAM: VS:  BP 110/64   Pulse (!) 58   Ht 5\' 2"  (1.575 m)   Wt 140 lb 1.9 oz (63.6 kg)   SpO2 98%   BMI 25.63 kg/m   GEN: Well nourished, well developed, in no acute distress HEENT: normal Neck: no JVD, carotid bruits, or masses Cardiac: RRR; no murmurs, rubs, or gallops,no edema  Respiratory:  clear to auscultation bilaterally, normal work of breathing GI: soft, nontender, nondistended, + BS  No hepatomegaly  MS: no deformity Moving all extremities   Skin: warm and dry, no rash Neuro:  Strength and sensation are intact Psych: euthymic mood, full affect   EKG:  EKG is not ordered today.   Lipid Panel    Component Value Date/Time   CHOL 164 10/19/2016 0353   TRIG 260 (H) 10/19/2016 0353   HDL 46 10/19/2016 0353   CHOLHDL 3.6 10/19/2016 0353   VLDL 52 (H) 10/19/2016 0353   LDLCALC 66 10/19/2016 0353   LDLDIRECT 121.5 09/28/2010 0914      Wt Readings from Last 3 Encounters:  11/01/16 140 lb 1.9 oz (63.6 kg)  10/18/16 137 lb 14.4 oz (62.6 kg)  03/23/16 141 lb 6.4 oz (64.1 kg)      ASSESSMENT AND PLAN:  1  CAD  Denies angina.  Will review plavix use     2.  HL  Continue Crestor   F/U next winter    Signed, Dorris Carnes, MD  11/01/2016 11:31 AM    [facility] [address], [place], [place]  [address] Phone: [phone];  Fax: [phone]

## 2016-11-01 ENCOUNTER — Ambulatory Visit (INDEPENDENT_AMBULATORY_CARE_PROVIDER_SITE_OTHER): Payer: Medicare Other | Admitting: Internal Medicine

## 2016-11-01 ENCOUNTER — Encounter: Payer: Self-pay | Admitting: Internal Medicine

## 2016-11-01 ENCOUNTER — Other Ambulatory Visit: Payer: Self-pay | Admitting: Internal Medicine

## 2016-11-01 VITALS — BP 168/80 | HR 58 | Ht 62.0 in | Wt 140.1 lb

## 2016-11-01 DIAGNOSIS — I2510 Atherosclerotic heart disease of native coronary artery without angina pectoris: Secondary | ICD-10-CM

## 2016-11-01 DIAGNOSIS — E785 Hyperlipidemia, unspecified: Secondary | ICD-10-CM

## 2016-11-01 MED ORDER — ROSUVASTATIN CALCIUM 20 MG PO TABS: 20.0000 mg | ORAL_TABLET | Freq: Every day | ORAL | 3 refills | Status: DC

## 2016-11-01 MED ORDER — CLOPIDOGREL BISULFATE 75 MG PO TABS: 75.0000 mg | ORAL_TABLET | Freq: Every day | ORAL | 3 refills | Status: DC

## 2016-11-01 MED ORDER — NITROGLYCERIN 0.4 MG SL SUBL: 0.4000 mg | SUBLINGUAL_TABLET | SUBLINGUAL | 3 refills | Status: DC | PRN

## 2016-11-01 MED ORDER — METOPROLOL TARTRATE 25 MG PO TABS: 12.5000 mg | ORAL_TABLET | Freq: Two times a day (BID) | ORAL | 3 refills | Status: DC

## 2016-11-01 NOTE — Patient Instructions (Signed)
Your physician recommends that you continue on your current medications as directed. Please refer to the Current Medication list given to you today.  Your physician wants you to follow-up in: December, 2018 with Dr. Harrington Challenger.  You will receive a reminder letter in the mail two months in advance. If you don't receive a letter, please call our office to schedule the follow-up appointment.  I called the pharmacy and asked them to keep today's refilled meds on file until refills are needed.  Pt stated she did not need them today.

## 2016-12-05 DIAGNOSIS — R300 Dysuria: Secondary | ICD-10-CM | POA: Diagnosis not present

## 2016-12-05 DIAGNOSIS — N3001 Acute cystitis with hematuria: Secondary | ICD-10-CM | POA: Diagnosis not present

## 2016-12-07 ENCOUNTER — Encounter (HOSPITAL_COMMUNITY): Payer: Self-pay | Admitting: Emergency Medicine

## 2016-12-07 ENCOUNTER — Emergency Department (HOSPITAL_COMMUNITY)
Admission: EM | Admit: 2016-12-07 | Discharge: 2016-12-07 | Disposition: A | Discharge status: Home / Self Care | Payer: Medicare Other | Attending: Emergency Medicine | Admitting: Emergency Medicine

## 2016-12-07 ENCOUNTER — Emergency Department (HOSPITAL_COMMUNITY): Payer: Medicare Other

## 2016-12-07 DIAGNOSIS — R531 Weakness: Secondary | ICD-10-CM | POA: Diagnosis not present

## 2016-12-07 DIAGNOSIS — R42 Dizziness and giddiness: Secondary | ICD-10-CM | POA: Diagnosis not present

## 2016-12-07 DIAGNOSIS — Z7982 Long term (current) use of aspirin: Secondary | ICD-10-CM | POA: Insufficient documentation

## 2016-12-07 DIAGNOSIS — E039 Hypothyroidism, unspecified: Secondary | ICD-10-CM | POA: Insufficient documentation

## 2016-12-07 DIAGNOSIS — Z955 Presence of coronary angioplasty implant and graft: Secondary | ICD-10-CM | POA: Insufficient documentation

## 2016-12-07 DIAGNOSIS — I2510 Atherosclerotic heart disease of native coronary artery without angina pectoris: Secondary | ICD-10-CM | POA: Insufficient documentation

## 2016-12-07 DIAGNOSIS — Z7902 Long term (current) use of antithrombotics/antiplatelets: Secondary | ICD-10-CM | POA: Diagnosis not present

## 2016-12-07 DIAGNOSIS — I10 Essential (primary) hypertension: Secondary | ICD-10-CM | POA: Insufficient documentation

## 2016-12-07 DIAGNOSIS — R404 Transient alteration of awareness: Secondary | ICD-10-CM | POA: Diagnosis not present

## 2016-12-07 DIAGNOSIS — R110 Nausea: Secondary | ICD-10-CM | POA: Diagnosis not present

## 2016-12-07 DIAGNOSIS — R55 Syncope and collapse: Secondary | ICD-10-CM | POA: Insufficient documentation

## 2016-12-07 DIAGNOSIS — R5383 Other fatigue: Secondary | ICD-10-CM

## 2016-12-07 DIAGNOSIS — Z79899 Other long term (current) drug therapy: Secondary | ICD-10-CM | POA: Insufficient documentation

## 2016-12-07 LAB — I-STAT TROPONIN, ED
TROPONIN I, POC: 0 ng/mL (ref 0.00–0.08)
Troponin i, poc: 0 ng/mL (ref 0.00–0.08)

## 2016-12-07 LAB — BASIC METABOLIC PANEL
Anion gap: 8 (ref 5–15)
BUN: 11 mg/dL (ref 6–20)
CHLORIDE: 103 mmol/L (ref 101–111)
CO2: 29 mmol/L (ref 22–32)
Calcium: 9.3 mg/dL (ref 8.9–10.3)
Creatinine, Ser: 0.76 mg/dL (ref 0.44–1.00)
GFR calc Af Amer: >60 mL/min (ref >60)
GFR calc non Af Amer: >60 mL/min (ref >60)
Glucose, Bld: 83 mg/dL (ref 65–99)
POTASSIUM: 3.6 mmol/L (ref 3.5–5.1)
SODIUM: 140 mmol/L (ref 135–145)

## 2016-12-07 LAB — CBC
HEMATOCRIT: 36 % (ref 36.0–46.0)
HEMOGLOBIN: 11.2 g/dL — AB (ref 12.0–15.0)
MCH: 27.1 pg (ref 26.0–34.0)
MCHC: 31.1 g/dL (ref 30.0–36.0)
MCV: 87 fL (ref 78.0–100.0)
Platelets: 222 10*3/uL (ref 150–400)
RBC: 4.14 MIL/uL (ref 3.87–5.11)
RDW: 13.9 % (ref 11.5–15.5)
WBC: 8.7 10*3/uL (ref 4.0–10.5)

## 2016-12-07 LAB — URINALYSIS, ROUTINE W REFLEX MICROSCOPIC
BILIRUBIN URINE: NEGATIVE
Bacteria, UA: NONE SEEN
GLUCOSE, UA: NEGATIVE mg/dL
Hgb urine dipstick: NEGATIVE
KETONES UR: NEGATIVE mg/dL
NITRITE: NEGATIVE
PH: 8 (ref 5.0–8.0)
Protein, ur: NEGATIVE mg/dL
Specific Gravity, Urine: 1.004 — ABNORMAL LOW (ref 1.005–1.030)

## 2016-12-07 NOTE — Progress Notes (Signed)
Pt has been on ASA and Plavix for over 4 years   Should be OK to stp plavix  Continue ASA

## 2016-12-07 NOTE — ED Notes (Signed)
Pt ambulated to restroom from room, tolerated well. 

## 2016-12-07 NOTE — ED Notes (Signed)
Pt stated that she had not taken afternoon BP meds today.

## 2016-12-07 NOTE — ED Notes (Signed)
Patient transported to X-ray 

## 2016-12-07 NOTE — ED Provider Notes (Addendum)
[facility] DEPT Provider Note   CSN: [id number] Arrival date & time: 12/07/16  1319     History   Chief Complaint Chief Complaint  Patient presents with  . Weakness  . Dizziness  . Nausea  . Shoulder Pain    HPI [name] is a 71 y.o. female.  HPI  Pt with hx of CAD s/p stent, HTN presenting with c/o near syncope and weakness.  Pt states that she was started on keflex for UTI 2 nights ago- has been taking QID (has taken 7 pills thus far)- has been doing well.  She was sitting at her computer and felt nauseated somewhat today.  Then walked out to her mailbox- states she has a long driveway it was very hot outside- when she came inside she felt very hot and became very weak.  She felt lighteaded and nearly fainted.  She leaned over the [place] in her kitchen.  No chest pain, no difficulty breathing.  No syncope.   She has not had vomiting.  She felt it was hard to get over the weakness and the feeling of being hot.  Her husband called EMS.  She currently feels improved in the ED.  She has not had fever/chills.  She has been tolerating keflex well.  There are no other associated systemic symptoms, there are no other alleviating or modifying factors.   She states this feels different from her prior ACS/stents  Past Medical History:  Diagnosis Date  . Anginal pain ([place])   . Carotid artery disease ([place])    a. Carotid US 9/17: R 1-39%; L 40-59% >> FU 1 year  . Coronary atherosclerosis of native coronary artery    a. DES to LCx in 07/2010 b. DES to RCA in 04/2013  . Exertional shortness of breath   . Heart murmur   . Hiatal hernia   . History of blood transfusion    "after daughter was born" (05/22/2013)  . HLD (hyperlipidemia)   . HTN (hypertension)   . Hypothyroidism   . Osteoporosis   . Palpitations   . Premature ventricular contractions   . Recurrent UTI    "q 4 months for the last couple years; last one was 12/2012" (05/22/2013)  . Sinus headache   . Tremors of  nervous system    by dr. love    Patient Active Problem List   Diagnosis Date Noted  . Abnormal EKG 10/19/2016  . Carotid artery disease ([place])   . Pain in the chest 02/25/2016  . Chest pain 01/27/2015  . S/P primary angioplasty with coronary stent 05/24/2013  . Hypokalemia 05/24/2013  . Hypothyroidism 08/14/2010  . Hyperlipidemia 08/14/2010  . Essential hypertension 08/14/2010  . CAD, NATIVE VESSEL 08/14/2010  . PREMATURE VENTRICULAR CONTRACTIONS 08/14/2010  . HIATAL HERNIA 08/14/2010    Past Surgical History:  Procedure Laterality Date  . APPENDECTOMY    . BREAST CYST ASPIRATION Bilateral    "numerous in the dr's office" (05/22/2013)  . BREAST CYST EXCISION Right 1965  . BREAST LUMPECTOMY Right 1980's   "benign" (05/22/2013)  . CARDIAC CATHETERIZATION  ~ 1968; 08/2010  . CORONARY ANGIOPLASTY WITH STENT PLACEMENT  07/2010  . CORONARY ANGIOPLASTY WITH STENT PLACEMENT  05/23/2013   Patent LCx stent (10-20% ISR), 70% mid RCA (FFR 0.73) s/p DES, 20% prox RCA, 40% ostial D1, 60% distal posterior AV branch; EF 65-70%  . ECTOPIC PREGNANCY SURGERY    . LAPAROTOMY  1965   "ruptured blood vessel in my side during  childbirth; didn't find it til 2 days later; had to open me up to repair it" (05/22/2013)  . LEFT HEART CATHETERIZATION WITH CORONARY ANGIOGRAM N/A 05/23/2013   Procedure: LEFT HEART CATHETERIZATION WITH CORONARY ANGIOGRAM;  Surgeon: Wellington Hampshire, MD;  Location: [facility] CATH LAB;  Service: Cardiovascular;  Laterality: N/A;  . LEFT HEART CATHETERIZATION WITH CORONARY ANGIOGRAM N/A 01/28/2014   Procedure: LEFT HEART CATHETERIZATION WITH CORONARY ANGIOGRAM;  Surgeon: Blane Ohara, MD;  Location: [facility] C[facility] LAB;  Service: Cardiovascular;  Laterality: N/A;  . TONSILLECTOMY    . TOTAL ABDOMINAL HYSTERECTOMY      OB History    No data available       Home Medications    Prior to Admission medications   Medication Sig Start Date End Date Taking? Authorizing Provider  aspirin  (ASPIR-81) 81 MG EC tablet Take 81 mg by mouth daily.     Yes [provider]  Calcium Carbonate (CALTRATE 600) 1500 MG TABS Take 600 mg of elemental calcium by mouth 2 (two) times daily.    Yes [provider]  cephALEXin (KEFLEX) 250 MG capsule Take 250 mg by mouth 4 (four) times daily. 12/05/16  Yes [provider]  cholecalciferol (VITAMIN D) 1000 UNITS tablet Take 1,000 Units by mouth daily.   Yes [provider]  clopidogrel (PLAVIX) 75 MG tablet Take 1 tablet (75 mg total) by mouth daily. 11/01/16  Yes Fay Records, MD  esomeprazole (NEXIUM) 40 MG capsule Take 40 mg by mouth daily.     Yes [provider]  levothyroxine (SYNTHROID, LEVOTHROID) 50 MCG tablet Take 50 mcg by mouth daily before breakfast.   Yes [provider]  metoprolol tartrate (LOPRESSOR) 25 MG tablet Take 0.5 tablets (12.5 mg total) by mouth 2 (two) times daily. 11/01/16  Yes Fay Records, MD  Multiple Vitamins-Minerals (MULTIVITAL) tablet Take 1 tablet by mouth daily.     Yes [provider]  nitroGLYCERIN (NITROSTAT) 0.4 MG SL tablet DISSOLVE ONE TABLET UNDER TONGUE AS NEEDED FOR CHEST PAIN EVERY 5 MINUTES FOR 3 DOSES 11/02/16  Yes Fay Records, MD  rosuvastatin (CRESTOR) 20 MG tablet Take 1 tablet (20 mg total) by mouth at bedtime. 11/01/16  Yes Fay Records, MD  valsartan-hydrochlorothiazide (DIOVAN HCT) 160-12.5 MG tablet Take 1 tablet by mouth daily. 07/11/15  Yes Fay Records, MD    Family History Family History  Problem Relation Age of Onset  . CAD Mother   . Lymphoma Mother   . Heart disease Father   . Heart attack Brother 55  . Stroke Other     Social History Social History  Substance Use Topics  . Smoking status: Never Smoker  . Smokeless tobacco: Never Used  . Alcohol use No     Allergies   Atorvastatin; Codeine; Flagyl [metronidazole hcl]; Simvastatin; Sulfa antibiotics; Tamiflu [oseltamivir]; Metronidazole; Penicillins; and  Prednisone   Review of Systems Review of Systems  ROS reviewed and all otherwise negative except for mentioned in HPI   Physical Exam Updated Vital Signs BP (!) 167/81   Pulse 75   Temp 98.5 F (36.9 C) (Oral)   Resp 16   Ht 5\' 2"  (1.575 m)   Wt 62.1 kg   SpO2 100%   BMI 25.06 kg/m  Vitals reviewed Physical Exam Physical Examination: General appearance - alert, well appearing, and in no distress Mental status - alert, oriented to person, place, and time Eyes - no conjunctival injection, no scleral icterus  Mouth - mucous membranes moist, pharynx normal without lesions Neck - supple, no significant adenopathy Chest - clear to auscultation, no wheezes, rales or rhonchi, symmetric air entry Heart - normal rate, regular rhythm, normal S1, S2, no murmurs, rubs, clicks or gallops Abdomen - soft, nontender, nondistended, no masses or organomegaly Neurological - alert, oriented, normal speech Extremities - peripheral pulses normal, no pedal edema, no clubbing or cyanosis Skin - normal coloration and turgor, no rashes  ED Treatments / Results  Labs (all labs ordered are listed, but only abnormal results are displayed) Labs Reviewed  CBC - Abnormal; Notable for the following:       Result Value   Hemoglobin 11.2 (*)    All other components within normal limits  URINALYSIS, ROUTINE W REFLEX MICROSCOPIC - Abnormal; Notable for the following:    APPearance HAZY (*)    Specific Gravity, Urine 1.004 (*)    Leukocytes, UA TRACE (*)    Squamous Epithelial / LPF 0-5 (*)    All other components within normal limits  BASIC METABOLIC PANEL  I-STAT TROPOININ, ED    EKG  EKG Interpretation  Date/Time:  Tuesday Dec 07 2016 13:24:28 EDT Ventricular Rate:  78 PR Interval:  164 QRS Duration: 84 QT Interval:  406 QTC Calculation: 462 R Axis:   18 Text Interpretation:  Sinus rhythm with frequent Premature ventricular complexes Otherwise normal ECG Since previous tracing PVCs are new,  axis is no longer rigthward Confirmed by Canary Brim  MD, Yaritzy Huser [phone]) on 12/07/2016 1:35:43 PM       Radiology Dg Chest 2 View  Result Date: 12/07/2016 CLINICAL DATA:  patient coming from home complaining of nausea, dizziness, weakness, and shoulder pain. Began approx 11am today. Was diagnosed with UTI on Sunday and prescribed Keflex. EXAM: CHEST  2 VIEW COMPARISON:  Chest x-ray dated 10/18/2016 and chest x-ray dated 02/25/2016. FINDINGS: Heart size is upper normal, stable. Overall cardiomediastinal silhouette is stable. Atherosclerotic changes noted at the aortic arch. Lungs are clear. No pleural effusion or pneumothorax seen. Stable kyphosis of the thoracic spine. No acute or suspicious osseous lesion. IMPRESSION: No active cardiopulmonary disease. No evidence of pneumonia or pulmonary edema. Electronically Signed   By: Franki Cabot M.D.   On: 12/07/2016 14:36    Procedures Procedures (including critical care time)  Medications Ordered in ED Medications - No data to display   Initial Impression / Assessment and Plan / ED Course  I have reviewed the triage vital signs and the nursing notes.  Pertinent labs & imaging results that were available during my care of the patient were reviewed by me and considered in my medical decision making (see chart for details).     Pt presenting due to generalized weakness and near syncope at home after walking outside in the heat.  She is currently taking keflex- on second full day of treatment for UTI.  She has not had any chest pain or shortness of breath.  Doubt ACS.  She does not have signs of sepsis. Her UA shows UTI is clearing.  Her initial troponin is negative. Pt will have second troponin checked at 6pm today- if this is negative I feel it is reasonable to discharge patient to f/u with her doctor as an outpatient.  Pt is agreeable with this plan and would prefer to be discharged.  Pt signed out to Dr. Benay Pike at end of shift pending repeat troponin.     Final Clinical Impressions(s) / ED Diagnoses   Final diagnoses:  Near syncope    New Prescriptions New Prescriptions   No medications on file     Alfonzo Beers, MD 12/07/16 [place], Aalyssa Elderkin, MD 12/07/16 1626

## 2016-12-07 NOTE — ED Triage Notes (Signed)
Per EMS, patient coming from home complaining of nausea, dizziness, weakness, and shoulder pain. Began approx 11am today. Was diagnosed with UTI on Sunday and prescribed Keflex.  Afebrile.  Hx of stents and angioplasty. Denies chest pain, sob, and diaphoresis. 12L unremarkable. 99%RA, 160/70, HR 80, RR 14, 164 CBG, 20LAC

## 2016-12-07 NOTE — ED Notes (Signed)
Pt stated she is going to take her B/P meds.  It is 5 hours past due.  Diovan 160mg  HCTZ 12.5mg 

## 2016-12-07 NOTE — ED Provider Notes (Signed)
I assumed care of this patient from Dr. Canary Brim at 1600.  Please see their note for further details of Hx, PE.  Briefly patient is a 71 y.o. female who presents with fatigue and near syncope symptoms after walking down her driveway in the heat. No chest pain or SOB. Pt does have a  H/o CAD with stenting. Currently, pt is feeling well; well appearing. HDS. Orthostatics negative. EKG w/o acute changes. Initial trop negative. CBC and UA reassuring. Still pending BMP. Will plan for delta trop at 1800. If this is negative, Dr. Canary Brim feels that she should be appropriate for discharge home with PCP or cardiology follow up.  BMP unremarkable.  Delta trop negative  On reassessment, pt is hemodynamically stable, well-appearing, well-hydrated, nontoxic. Currently asymptomatic.  The patient is safe for discharge with strict return precautions.  Disposition: Discharge  Condition: Good  I have discussed the results, Dx and Tx plan with the patient who expressed understanding and agree(s) with the plan. Discharge instructions discussed at great length. The patient was given strict return precautions who verbalized understanding of the instructions. No further questions at time of discharge.    New Prescriptions   No medications on file    Follow Up: Lawerance Cruel, [place] [address] [phone]  Schedule an appointment as soon as possible for a visit  As needed      [place], Grayce Sessions, MD 12/07/16 1902

## 2016-12-08 ENCOUNTER — Other Ambulatory Visit: Payer: Self-pay | Admitting: *Deleted

## 2016-12-08 NOTE — Progress Notes (Signed)
Called patient and informed per Dr. Harrington Challenger ok to Stop Plavix, continue aspirin.  She is appreciative for the information.  Medication list updated.

## 2016-12-16 DIAGNOSIS — N390 Urinary tract infection, site not specified: Secondary | ICD-10-CM | POA: Diagnosis not present

## 2016-12-16 DIAGNOSIS — R350 Frequency of micturition: Secondary | ICD-10-CM | POA: Diagnosis not present

## 2017-01-10 DIAGNOSIS — H2513 Age-related nuclear cataract, bilateral: Secondary | ICD-10-CM | POA: Diagnosis not present

## 2017-01-10 DIAGNOSIS — H5203 Hypermetropia, bilateral: Secondary | ICD-10-CM | POA: Diagnosis not present

## 2017-03-22 DIAGNOSIS — E559 Vitamin D deficiency, unspecified: Secondary | ICD-10-CM | POA: Diagnosis not present

## 2017-03-22 DIAGNOSIS — E7800 Pure hypercholesterolemia, unspecified: Secondary | ICD-10-CM | POA: Diagnosis not present

## 2017-03-22 DIAGNOSIS — I10 Essential (primary) hypertension: Secondary | ICD-10-CM | POA: Diagnosis not present

## 2017-03-22 DIAGNOSIS — D649 Anemia, unspecified: Secondary | ICD-10-CM | POA: Diagnosis not present

## 2017-03-22 DIAGNOSIS — E039 Hypothyroidism, unspecified: Secondary | ICD-10-CM | POA: Diagnosis not present

## 2017-03-29 DIAGNOSIS — E7800 Pure hypercholesterolemia, unspecified: Secondary | ICD-10-CM | POA: Diagnosis not present

## 2017-03-29 DIAGNOSIS — E039 Hypothyroidism, unspecified: Secondary | ICD-10-CM | POA: Diagnosis not present

## 2017-03-29 DIAGNOSIS — I10 Essential (primary) hypertension: Secondary | ICD-10-CM | POA: Diagnosis not present

## 2017-03-29 DIAGNOSIS — D649 Anemia, unspecified: Secondary | ICD-10-CM | POA: Diagnosis not present

## 2017-03-29 DIAGNOSIS — I209 Angina pectoris, unspecified: Secondary | ICD-10-CM | POA: Diagnosis not present

## 2017-03-29 DIAGNOSIS — K219 Gastro-esophageal reflux disease without esophagitis: Secondary | ICD-10-CM | POA: Diagnosis not present

## 2017-03-29 DIAGNOSIS — E559 Vitamin D deficiency, unspecified: Secondary | ICD-10-CM | POA: Diagnosis not present

## 2017-03-29 DIAGNOSIS — Z0000 Encounter for general adult medical examination without abnormal findings: Secondary | ICD-10-CM | POA: Diagnosis not present

## 2017-03-29 DIAGNOSIS — M810 Age-related osteoporosis without current pathological fracture: Secondary | ICD-10-CM | POA: Diagnosis not present

## 2017-04-07 ENCOUNTER — Other Ambulatory Visit: Payer: Self-pay | Admitting: Physician Assistant

## 2017-04-28 DIAGNOSIS — Z23 Encounter for immunization: Secondary | ICD-10-CM | POA: Diagnosis not present

## 2017-06-23 DIAGNOSIS — M818 Other osteoporosis without current pathological fracture: Secondary | ICD-10-CM | POA: Diagnosis not present

## 2017-06-23 DIAGNOSIS — G4489 Other headache syndrome: Secondary | ICD-10-CM | POA: Diagnosis not present

## 2017-06-23 DIAGNOSIS — Z883 Allergy status to other anti-infective agents status: Secondary | ICD-10-CM | POA: Diagnosis not present

## 2017-06-23 DIAGNOSIS — I10 Essential (primary) hypertension: Secondary | ICD-10-CM | POA: Diagnosis not present

## 2017-06-23 DIAGNOSIS — Z888 Allergy status to other drugs, medicaments and biological substances status: Secondary | ICD-10-CM | POA: Diagnosis not present

## 2017-06-23 DIAGNOSIS — S79911A Unspecified injury of right hip, initial encounter: Secondary | ICD-10-CM | POA: Diagnosis not present

## 2017-06-23 DIAGNOSIS — Z880 Allergy status to penicillin: Secondary | ICD-10-CM | POA: Diagnosis not present

## 2017-06-23 DIAGNOSIS — S199XXA Unspecified injury of neck, initial encounter: Secondary | ICD-10-CM | POA: Diagnosis not present

## 2017-06-23 DIAGNOSIS — S8991XA Unspecified injury of right lower leg, initial encounter: Secondary | ICD-10-CM | POA: Diagnosis not present

## 2017-06-23 DIAGNOSIS — Z955 Presence of coronary angioplasty implant and graft: Secondary | ICD-10-CM | POA: Diagnosis not present

## 2017-06-23 DIAGNOSIS — S7001XA Contusion of right hip, initial encounter: Secondary | ICD-10-CM | POA: Diagnosis not present

## 2017-06-23 DIAGNOSIS — W010XXA Fall on same level from slipping, tripping and stumbling without subsequent striking against object, initial encounter: Secondary | ICD-10-CM | POA: Diagnosis not present

## 2017-06-23 DIAGNOSIS — Z887 Allergy status to serum and vaccine status: Secondary | ICD-10-CM | POA: Diagnosis not present

## 2017-06-23 DIAGNOSIS — I672 Cerebral atherosclerosis: Secondary | ICD-10-CM | POA: Diagnosis not present

## 2017-06-23 DIAGNOSIS — Z79899 Other long term (current) drug therapy: Secondary | ICD-10-CM | POA: Diagnosis not present

## 2017-06-23 DIAGNOSIS — E079 Disorder of thyroid, unspecified: Secondary | ICD-10-CM | POA: Diagnosis not present

## 2017-06-23 DIAGNOSIS — S0990XA Unspecified injury of head, initial encounter: Secondary | ICD-10-CM | POA: Diagnosis not present

## 2017-06-23 DIAGNOSIS — M542 Cervicalgia: Secondary | ICD-10-CM | POA: Diagnosis not present

## 2017-06-23 DIAGNOSIS — Z7982 Long term (current) use of aspirin: Secondary | ICD-10-CM | POA: Diagnosis not present

## 2017-06-23 DIAGNOSIS — I2510 Atherosclerotic heart disease of native coronary artery without angina pectoris: Secondary | ICD-10-CM | POA: Diagnosis not present

## 2017-06-23 DIAGNOSIS — M47892 Other spondylosis, cervical region: Secondary | ICD-10-CM | POA: Diagnosis not present

## 2017-06-23 DIAGNOSIS — Z885 Allergy status to narcotic agent status: Secondary | ICD-10-CM | POA: Diagnosis not present

## 2017-06-23 DIAGNOSIS — S098XXA Other specified injuries of head, initial encounter: Secondary | ICD-10-CM | POA: Diagnosis not present

## 2017-07-05 IMAGING — CR DG CHEST 2V
2 series · 2 of 2 positions shown · non-contrast
Comparison: Radiographs January 27, 2015.

CLINICAL DATA: Chest pain.

EXAM:
CHEST  2 VIEW

[w chest pa]
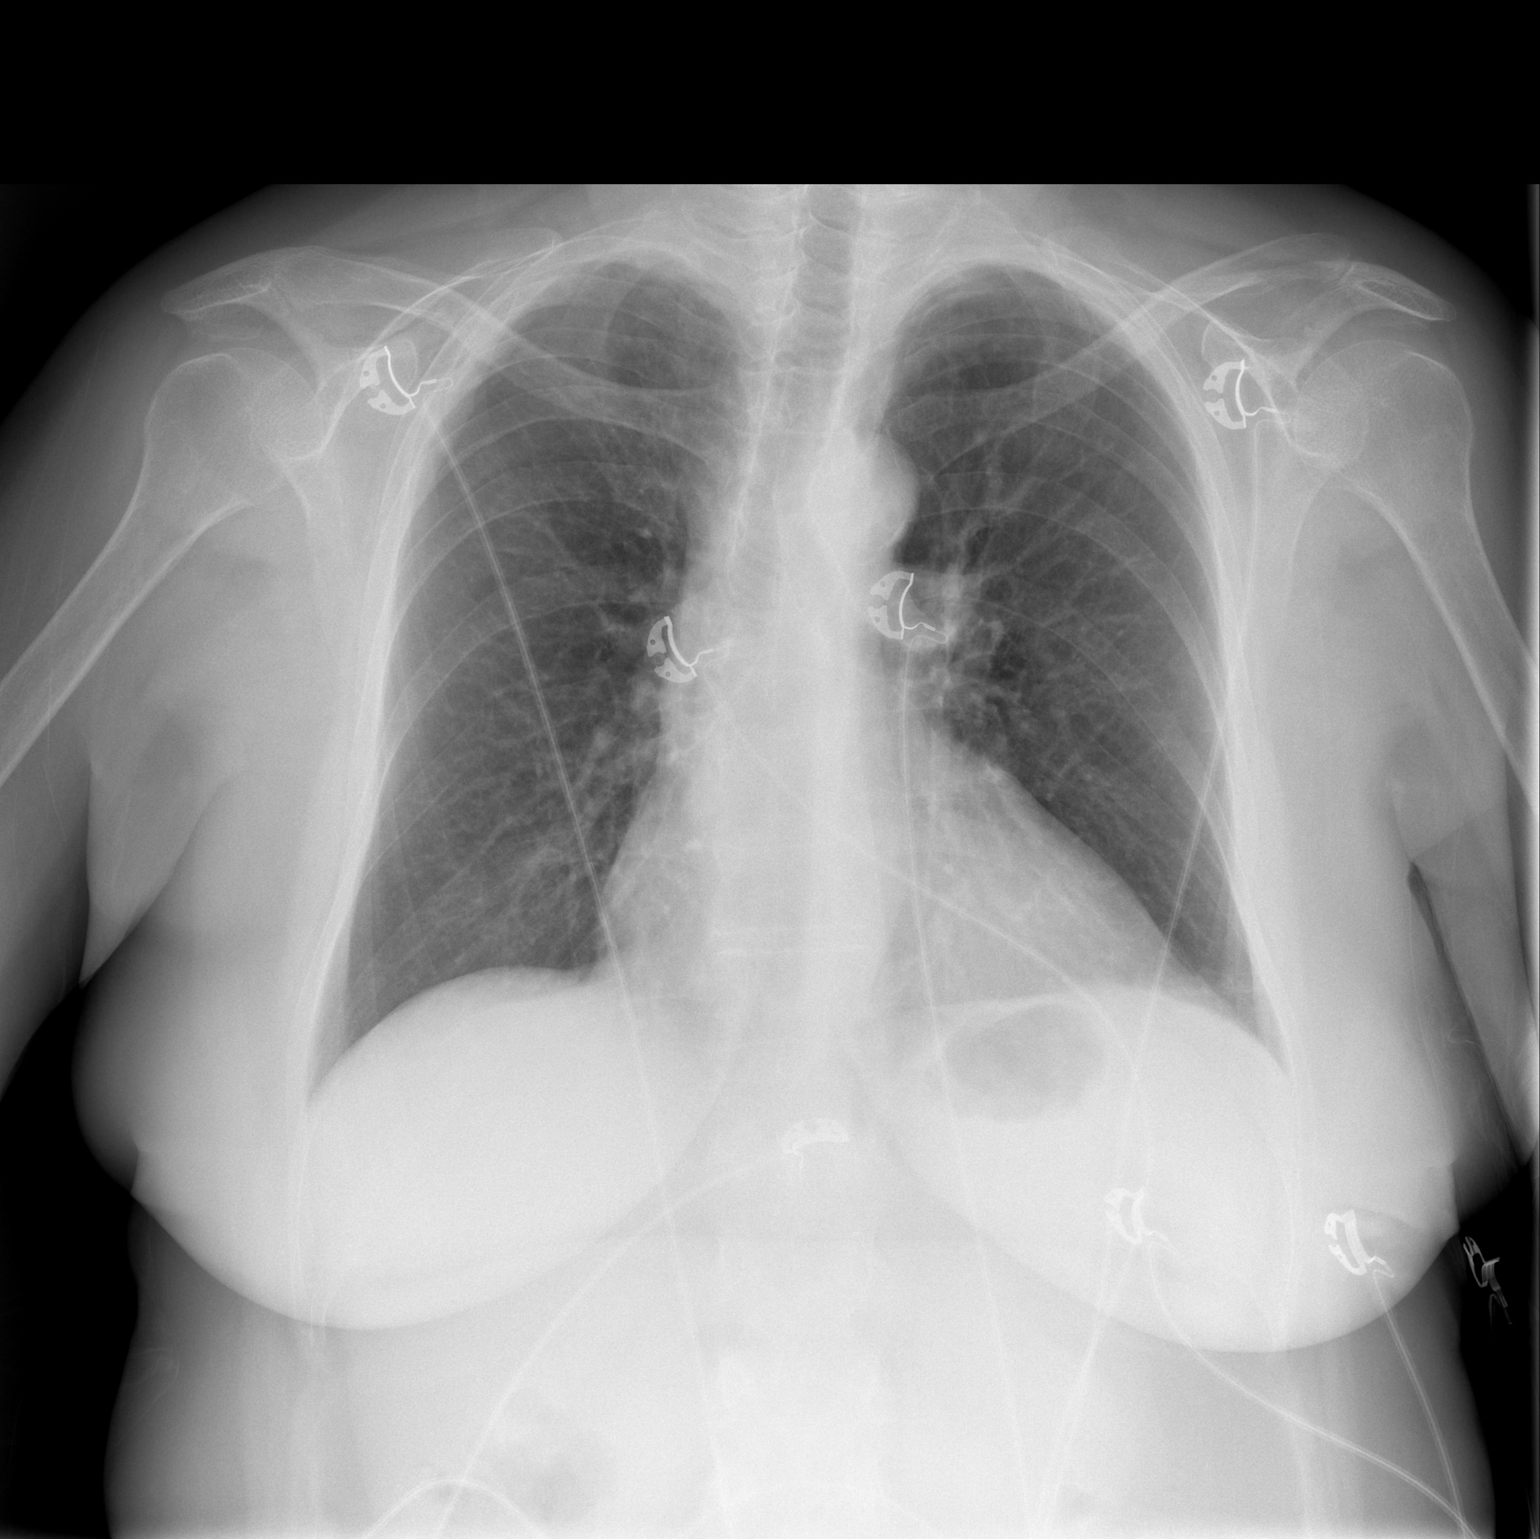

[w chest lat]
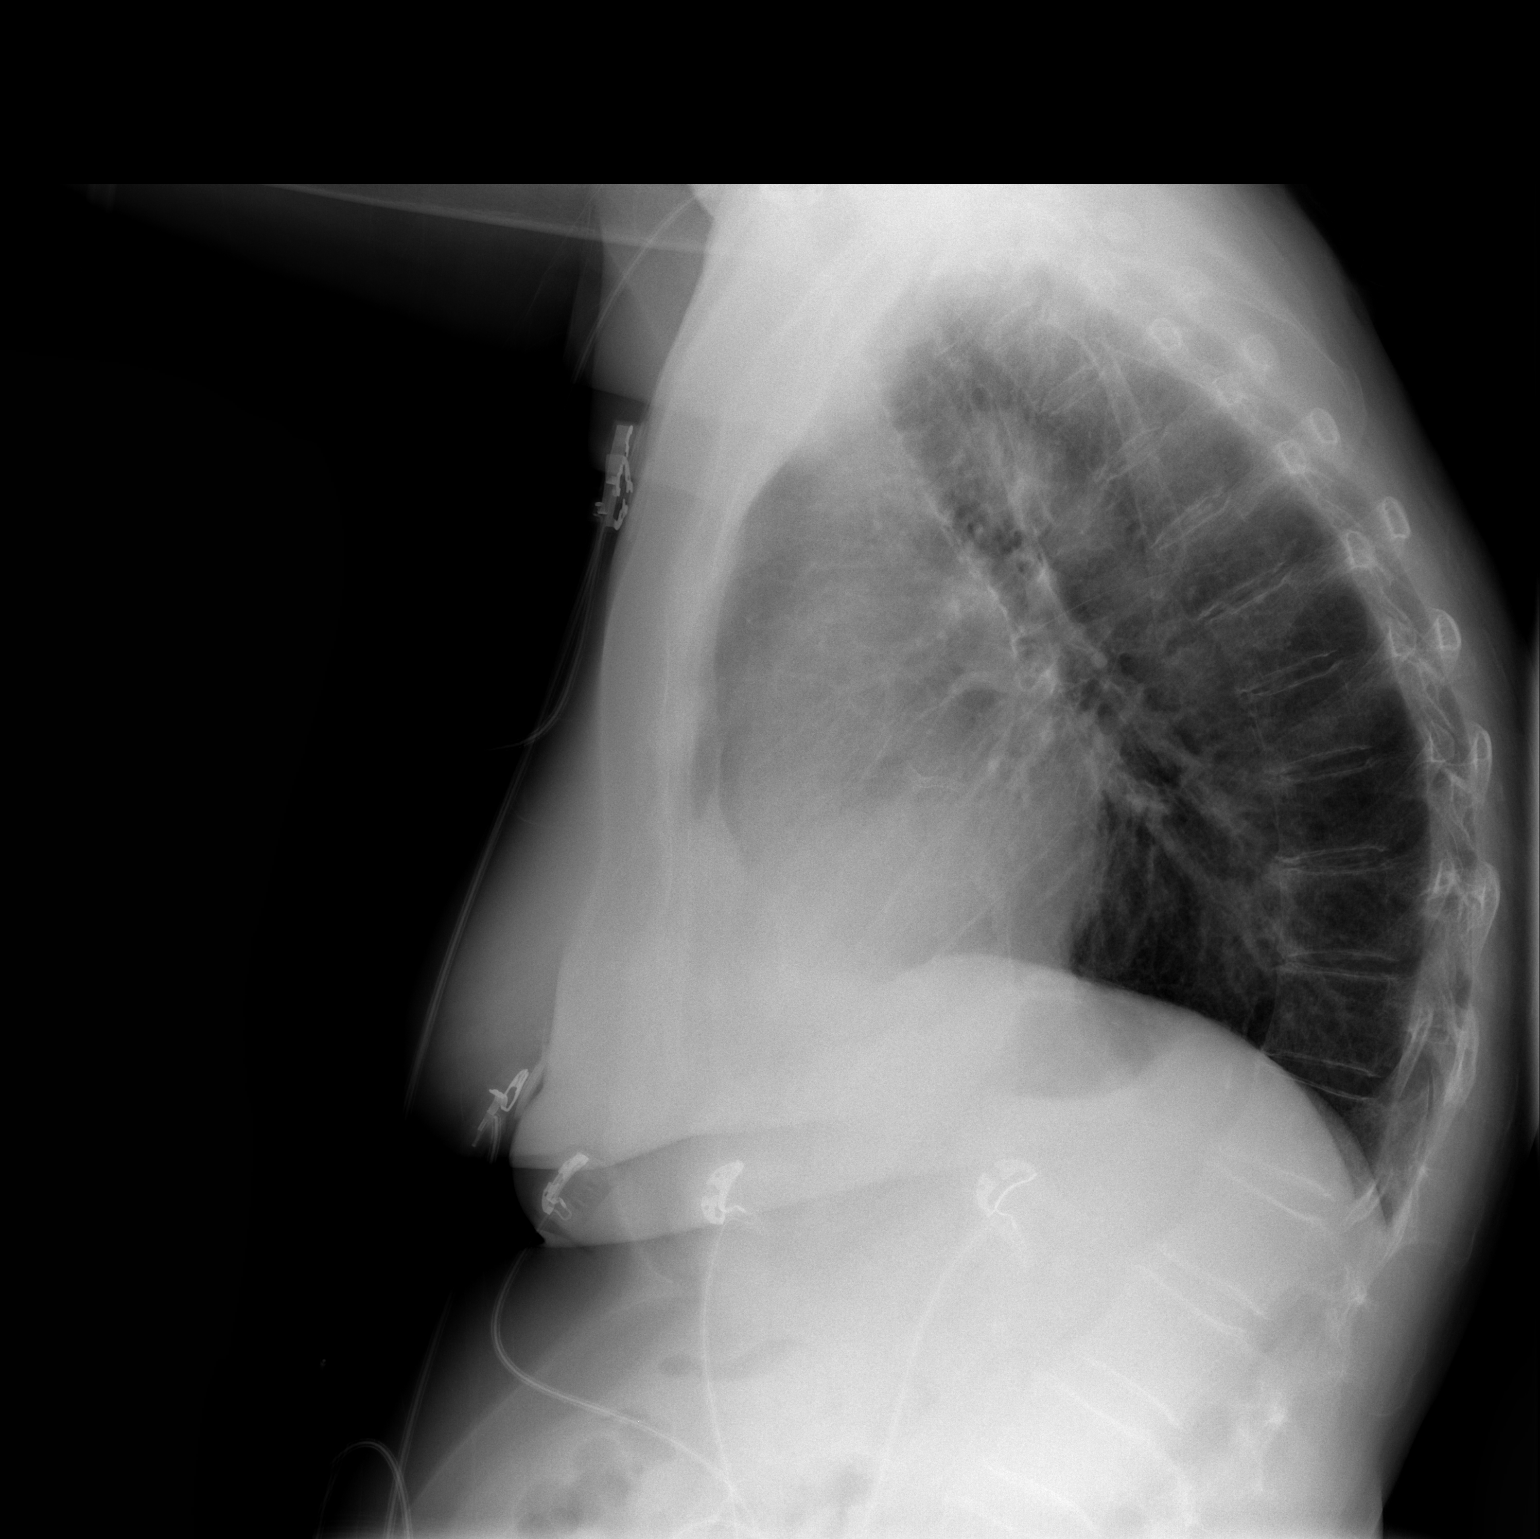

[2 of 2 positions shown; findings below may reference images not displayed]

FINDINGS: The heart size and mediastinal contours are within normal limits.
Both lungs are clear. No pneumothorax or pleural effusion is noted.
The visualized skeletal structures are unremarkable.
IMPRESSION: No active cardiopulmonary disease.

## 2017-07-07 DIAGNOSIS — N390 Urinary tract infection, site not specified: Secondary | ICD-10-CM | POA: Diagnosis not present

## 2017-07-07 DIAGNOSIS — T07XXXA Unspecified multiple injuries, initial encounter: Secondary | ICD-10-CM | POA: Diagnosis not present

## 2017-10-14 ENCOUNTER — Other Ambulatory Visit: Payer: Self-pay | Admitting: Internal Medicine

## 2017-11-09 DIAGNOSIS — J0100 Acute maxillary sinusitis, unspecified: Secondary | ICD-10-CM | POA: Diagnosis not present

## 2017-11-17 ENCOUNTER — Other Ambulatory Visit: Payer: Self-pay | Admitting: *Deleted

## 2017-11-17 MED ORDER — ROSUVASTATIN CALCIUM 20 MG PO TABS: 20.0000 mg | ORAL_TABLET | Freq: Every day | ORAL | 0 refills | Status: DC

## 2017-12-21 ENCOUNTER — Other Ambulatory Visit: Payer: Self-pay | Admitting: Internal Medicine

## 2018-01-02 ENCOUNTER — Other Ambulatory Visit: Payer: Self-pay | Admitting: Internal Medicine

## 2018-01-02 NOTE — Telephone Encounter (Signed)
Outpatient Medication Detail    Disp Refills Start End   metoprolol tartrate (LOPRESSOR) 25 MG tablet 90 tablet 0 12/21/2017    Sig: TAKE 1/2 TABLET BY MOUTH TWICE DAILY   Sent to pharmacy as: metoprolol tartrate (LOPRESSOR) 25 MG tablet   E-Prescribing Status: Receipt confirmed by pharmacy (12/21/2017 1:59 PM EDT)   Pharmacy   [id number] - [place], [place] - [address]

## 2018-02-03 ENCOUNTER — Ambulatory Visit (INDEPENDENT_AMBULATORY_CARE_PROVIDER_SITE_OTHER): Payer: Medicare Other | Admitting: Internal Medicine

## 2018-02-03 ENCOUNTER — Encounter: Payer: Self-pay | Admitting: Internal Medicine

## 2018-02-03 VITALS — BP 142/82 | HR 72 | Ht 62.0 in | Wt 141.8 lb

## 2018-02-03 DIAGNOSIS — I6523 Occlusion and stenosis of bilateral carotid arteries: Secondary | ICD-10-CM

## 2018-02-03 DIAGNOSIS — I10 Essential (primary) hypertension: Secondary | ICD-10-CM

## 2018-02-03 DIAGNOSIS — I2510 Atherosclerotic heart disease of native coronary artery without angina pectoris: Secondary | ICD-10-CM

## 2018-02-03 DIAGNOSIS — E782 Mixed hyperlipidemia: Secondary | ICD-10-CM

## 2018-02-03 MED ORDER — ROSUVASTATIN CALCIUM 20 MG PO TABS: 20.0000 mg | ORAL_TABLET | Freq: Every day | ORAL | 3 refills | Status: DC

## 2018-02-03 MED ORDER — NITROGLYCERIN 0.4 MG SL SUBL: SUBLINGUAL_TABLET | SUBLINGUAL | 6 refills | Status: DC

## 2018-02-03 MED ORDER — METOPROLOL TARTRATE 25 MG PO TABS: 12.5000 mg | ORAL_TABLET | Freq: Two times a day (BID) | ORAL | 3 refills | Status: DC

## 2018-02-03 NOTE — Patient Instructions (Signed)
Your physician recommends that you continue on your current medications as directed. Please refer to the Current Medication list given to you today.  Your physician has requested that you have a carotid duplex. This test is an ultrasound of the carotid arteries in your neck. It looks at blood flow through these arteries that supply the brain with blood. Allow one hour for this exam. There are no restrictions or special instructions.  Your physician wants you to follow-up in: 1 year with Dr. Harrington Challenger.  You will receive a reminder letter in the mail two months in advance. If you don't receive a letter, please call our office to schedule the follow-up appointment.

## 2018-02-03 NOTE — Progress Notes (Signed)
Cardiology Office Note   Date:  02/03/2018   ID:  [name], DOB 1945-12-16, MRN [id number]  PCP:  Lawerance Cruel, MD  Cardiologist:   Dorris Carnes, MD   F/U of CAD     History of Present Illness: [name] is a 72 y.o. female with a history of HTN and CAD   Last cath done in 2015 for CP  It showd patent stent to RCA  50% posterior AVsegment  Minimal dz in LAD LCx (nondominant) with long patent stent.  10 to 20% instent restenosis.   LVEF normal     I saw her in April 2018  [name] was seen in ED in May 2018 with near synciope    Since seean her breathing is OK    No no significant CP   Current Outpatient Medications  Medication Sig Dispense Refill  . aspirin (ASPIR-81) 81 MG EC tablet Take 81 mg by mouth daily.      . Calcium Carbonate (CALTRATE 600) 1500 MG TABS Take 600 mg of elemental calcium by mouth 2 (two) times daily.     . cephALEXin (KEFLEX) 250 MG capsule Take 250 mg by mouth 4 (four) times daily.    . cephALEXin (KEFLEX) 250 MG capsule Take 250 mg by mouth 4 (four) times daily as needed (UTI/Dental Appointments).    . cholecalciferol (VITAMIN D) 1000 UNITS tablet Take 1,000 Units by mouth daily.    Marland Kitchen esomeprazole (NEXIUM) 40 MG capsule Take 40 mg by mouth daily.      Marland Kitchen levothyroxine (SYNTHROID, LEVOTHROID) 50 MCG tablet Take 50 mcg by mouth daily before breakfast.    . metoprolol tartrate (LOPRESSOR) 25 MG tablet TAKE 1/2 TABLET BY MOUTH TWICE DAILY 90 tablet 0  . Multiple Vitamins-Minerals (MULTIVITAL) tablet Take 1 tablet by mouth daily.      . nitroGLYCERIN (NITROSTAT) 0.4 MG SL tablet DISSOLVE ONE TABLET UNDER TONGUE AS NEEDED FOR CHEST PAIN EVERY 5 MINUTES FOR 3 DOSES 75 tablet 3  . rosuvastatin (CRESTOR) 20 MG tablet Take 1 tablet (20 mg total) by mouth at bedtime. Please keep appointment for further refills 90 tablet 0  . valsartan-hydrochlorothiazide (DIOVAN HCT) 160-12.5 MG tablet Take 1 tablet by mouth daily. 90 tablet 3   No current  facility-administered medications for this visit.     Allergies:   Atorvastatin; Codeine; Flagyl [metronidazole hcl]; Metronidazole; Oseltamivir phosphate; Prednisone; Simvastatin; Sulfa antibiotics; Tamiflu [oseltamivir]; and Penicillins   Past Medical History:  Diagnosis Date  . Anginal pain ([place])   . Carotid artery disease ([place])    a. Carotid US 9/17: R 1-39%; L 40-59% >> FU 1 year  . Coronary atherosclerosis of native coronary artery    a. DES to LCx in 07/2010 b. DES to RCA in 04/2013  . Exertional shortness of breath   . Heart murmur   . Hiatal hernia   . History of blood transfusion    "after daughter was born" (05/22/2013)  . HLD (hyperlipidemia)   . HTN (hypertension)   . Hypothyroidism   . Osteoporosis   . Palpitations   . Premature ventricular contractions   . Recurrent UTI    "q 4 months for the last couple years; last one was 12/2012" (05/22/2013)  . Sinus headache   . Tremors of nervous system    by dr. love    Past Surgical History:  Procedure Laterality Date  . APPENDECTOMY    . BREAST CYST ASPIRATION Bilateral    "numerous  in the dr's office" (05/22/2013)  . BREAST CYST EXCISION Right 1965  . BREAST LUMPECTOMY Right 1980's   "benign" (05/22/2013)  . CARDIAC CATHETERIZATION  ~ 1968; 08/2010  . CORONARY ANGIOPLASTY WITH STENT PLACEMENT  07/2010  . CORONARY ANGIOPLASTY WITH STENT PLACEMENT  05/23/2013   Patent LCx stent (10-20% ISR), 70% mid RCA (FFR 0.73) s/p DES, 20% prox RCA, 40% ostial D1, 60% distal posterior AV branch; EF 65-70%  . ECTOPIC PREGNANCY SURGERY    . LAPAROTOMY  1965   "ruptured blood vessel in my side during childbirth; didn't find it til 2 days later; had to open me up to repair it" (05/22/2013)  . LEFT HEART CATHETERIZATION WITH CORONARY ANGIOGRAM N/A 05/23/2013   Procedure: LEFT HEART CATHETERIZATION WITH CORONARY ANGIOGRAM;  Surgeon: Wellington Hampshire, MD;  Location: [facility] CATH [facility];  Service: Cardiovascular;  Laterality: N/A;  . LEFT HEART  CATHETERIZATION WITH CORONARY ANGIOGRAM N/A 01/28/2014   Procedure: LEFT HEART CATHETERIZATION WITH CORONARY ANGIOGRAM;  Surgeon: Blane Ohara, MD;  Location: [facility] CATH LAB;  Service: Cardiovascular;  Laterality: N/A;  . TONSILLECTOMY    . TOTAL ABDOMINAL HYSTERECTOMY       Social History:  The patient  reports that she has never smoked. She has never used smokeless tobacco. She reports that she does not drink alcohol or use drugs.   Family History:  The patient's family history includes CAD in her mother; Heart attack (age of onset: 45) in her brother; Heart disease in her father; Lymphoma in her mother; Stroke in her other.    ROS:  Please see the history of present illness. All other systems are reviewed and  Negative to the above problem except as noted.    PHYSICAL EXAM: VS:  BP (!) 142/82 (BP Location: Right Arm, Patient Position: Sitting, Cuff Size: Normal)   Pulse 72   Ht 5\' 2"  (1.575 m)   Wt 64.3 kg (141 lb 12.8 oz)   BMI 25.94 kg/m   GEN: Well nourished, well developed, in no acute distress  HEENT: normal  Neck: JVP normal   Soft bilat carotid bruits Cardiac: RRR; no murmurs, rubs, or gallops,no edema  Respiratory:  clear to auscultation bilaterally, normal work of breathing GI: soft, nontender, nondistended, + BS  No hepatomegaly  MS: no deformity Moving all extremities   Skin: warm and dry, no rash Neuro:  Strength and sensation are intact Psych: euthymic mood, full affect   EKG:  EKG is ordered today.  SR 72 bpm   LVH   Lipid Panel    Component Value Date/Time   CHOL 164 10/19/2016 0353   TRIG 260 (H) 10/19/2016 0353   HDL 46 10/19/2016 0353   CHOLHDL 3.6 10/19/2016 0353   VLDL 52 (H) 10/19/2016 0353   LDLCALC 66 10/19/2016 0353   LDLDIRECT 121.5 09/28/2010 0914      Wt Readings from Last 3 Encounters:  02/03/18 64.3 kg (141 lb 12.8 oz)  12/07/16 62.1 kg (137 lb)  11/01/16 63.6 kg (140 lb 1.9 oz)      ASSESSMENT AND PLAN:  1  CAD No symptoms to  sugg angina    2   HL   COntinue Crestor   Get labs from Dr Harrington Challenger  3   CV dz  LAst scan in 2017   Moderate plaque in L carotid artery   Repeat 4   HTN   Fair BP   With hx dizziness would follow.     F/U next winter  Signed, Dorris Carnes, MD  02/03/2018 10:09 AM    [address], [place], [place]  [address] Phone: [phone]; Fax: [phone]

## 2018-02-10 ENCOUNTER — Ambulatory Visit (HOSPITAL_COMMUNITY)
Admission: RE | Admit: 2018-02-10 | Discharge: 2018-02-10 | Disposition: A | Discharge status: Home / Self Care | Payer: Medicare Other | Source: Ambulatory Visit | Attending: Cardiology | Admitting: Cardiology

## 2018-02-10 DIAGNOSIS — I6523 Occlusion and stenosis of bilateral carotid arteries: Secondary | ICD-10-CM | POA: Diagnosis not present

## 2018-02-13 DIAGNOSIS — H5203 Hypermetropia, bilateral: Secondary | ICD-10-CM | POA: Diagnosis not present

## 2018-02-13 DIAGNOSIS — H04123 Dry eye syndrome of bilateral lacrimal glands: Secondary | ICD-10-CM | POA: Diagnosis not present

## 2018-02-13 DIAGNOSIS — H2513 Age-related nuclear cataract, bilateral: Secondary | ICD-10-CM | POA: Diagnosis not present

## 2018-02-15 ENCOUNTER — Other Ambulatory Visit: Payer: Self-pay | Admitting: *Deleted

## 2018-02-15 DIAGNOSIS — I6523 Occlusion and stenosis of bilateral carotid arteries: Secondary | ICD-10-CM

## 2018-03-08 DIAGNOSIS — R8290 Unspecified abnormal findings in urine: Secondary | ICD-10-CM | POA: Diagnosis not present

## 2018-03-08 DIAGNOSIS — M542 Cervicalgia: Secondary | ICD-10-CM | POA: Diagnosis not present

## 2018-03-08 DIAGNOSIS — R350 Frequency of micturition: Secondary | ICD-10-CM | POA: Diagnosis not present

## 2018-03-08 DIAGNOSIS — N390 Urinary tract infection, site not specified: Secondary | ICD-10-CM | POA: Diagnosis not present

## 2018-03-16 DIAGNOSIS — H04123 Dry eye syndrome of bilateral lacrimal glands: Secondary | ICD-10-CM | POA: Diagnosis not present

## 2018-03-20 DIAGNOSIS — M810 Age-related osteoporosis without current pathological fracture: Secondary | ICD-10-CM | POA: Diagnosis not present

## 2018-03-20 DIAGNOSIS — N390 Urinary tract infection, site not specified: Secondary | ICD-10-CM | POA: Diagnosis not present

## 2018-03-20 DIAGNOSIS — M542 Cervicalgia: Secondary | ICD-10-CM | POA: Diagnosis not present

## 2018-03-20 DIAGNOSIS — R8290 Unspecified abnormal findings in urine: Secondary | ICD-10-CM | POA: Diagnosis not present

## 2018-03-20 DIAGNOSIS — E559 Vitamin D deficiency, unspecified: Secondary | ICD-10-CM | POA: Diagnosis not present

## 2018-03-20 DIAGNOSIS — I10 Essential (primary) hypertension: Secondary | ICD-10-CM | POA: Diagnosis not present

## 2018-03-20 DIAGNOSIS — E7800 Pure hypercholesterolemia, unspecified: Secondary | ICD-10-CM | POA: Diagnosis not present

## 2018-03-20 DIAGNOSIS — E039 Hypothyroidism, unspecified: Secondary | ICD-10-CM | POA: Diagnosis not present

## 2018-03-20 DIAGNOSIS — D649 Anemia, unspecified: Secondary | ICD-10-CM | POA: Diagnosis not present

## 2018-03-20 DIAGNOSIS — R350 Frequency of micturition: Secondary | ICD-10-CM | POA: Diagnosis not present

## 2018-03-28 ENCOUNTER — Encounter (HOSPITAL_BASED_OUTPATIENT_CLINIC_OR_DEPARTMENT_OTHER): Payer: Self-pay

## 2018-03-28 ENCOUNTER — Other Ambulatory Visit: Payer: Self-pay

## 2018-03-28 ENCOUNTER — Emergency Department (HOSPITAL_BASED_OUTPATIENT_CLINIC_OR_DEPARTMENT_OTHER)
Admission: EM | Admit: 2018-03-28 | Discharge: 2018-03-28 | Disposition: A | Discharge status: Home / Self Care | Payer: Medicare Other | Attending: Emergency Medicine | Admitting: Emergency Medicine

## 2018-03-28 ENCOUNTER — Emergency Department (HOSPITAL_BASED_OUTPATIENT_CLINIC_OR_DEPARTMENT_OTHER): Payer: Medicare Other

## 2018-03-28 DIAGNOSIS — R197 Diarrhea, unspecified: Secondary | ICD-10-CM | POA: Diagnosis not present

## 2018-03-28 DIAGNOSIS — Z7982 Long term (current) use of aspirin: Secondary | ICD-10-CM | POA: Diagnosis not present

## 2018-03-28 DIAGNOSIS — N12 Tubulo-interstitial nephritis, not specified as acute or chronic: Secondary | ICD-10-CM | POA: Diagnosis not present

## 2018-03-28 DIAGNOSIS — E039 Hypothyroidism, unspecified: Secondary | ICD-10-CM | POA: Insufficient documentation

## 2018-03-28 DIAGNOSIS — I2510 Atherosclerotic heart disease of native coronary artery without angina pectoris: Secondary | ICD-10-CM | POA: Insufficient documentation

## 2018-03-28 DIAGNOSIS — R9431 Abnormal electrocardiogram [ECG] [EKG]: Secondary | ICD-10-CM | POA: Diagnosis not present

## 2018-03-28 DIAGNOSIS — I10 Essential (primary) hypertension: Secondary | ICD-10-CM | POA: Diagnosis not present

## 2018-03-28 DIAGNOSIS — Z79899 Other long term (current) drug therapy: Secondary | ICD-10-CM | POA: Diagnosis not present

## 2018-03-28 DIAGNOSIS — N10 Acute tubulo-interstitial nephritis: Secondary | ICD-10-CM | POA: Insufficient documentation

## 2018-03-28 DIAGNOSIS — R1011 Right upper quadrant pain: Secondary | ICD-10-CM | POA: Insufficient documentation

## 2018-03-28 DIAGNOSIS — R110 Nausea: Secondary | ICD-10-CM | POA: Diagnosis not present

## 2018-03-28 LAB — COMPREHENSIVE METABOLIC PANEL WITH GFR
ALT: 24 U/L (ref 0–44)
AST: 36 U/L (ref 15–41)
Albumin: 4.2 g/dL (ref 3.5–5.0)
Alkaline Phosphatase: 75 U/L (ref 38–126)
Anion gap: 13 (ref 5–15)
BUN: 12 mg/dL (ref 8–23)
CO2: 27 mmol/L (ref 22–32)
Calcium: 9.8 mg/dL (ref 8.9–10.3)
Chloride: 101 mmol/L (ref 98–111)
Creatinine, Ser: 0.7 mg/dL (ref 0.44–1.00)
GFR calc Af Amer: >60 mL/min
GFR calc non Af Amer: >60 mL/min
Glucose, Bld: 102 mg/dL — ABNORMAL HIGH (ref 70–99)
Potassium: 3.3 mmol/L — ABNORMAL LOW (ref 3.5–5.1)
Sodium: 141 mmol/L (ref 135–145)
Total Bilirubin: 1.1 mg/dL (ref 0.3–1.2)
Total Protein: 7.3 g/dL (ref 6.5–8.1)

## 2018-03-28 LAB — CBC WITH DIFFERENTIAL/PLATELET
BASOS PCT: 0 %
Basophils Absolute: 0 10*3/uL (ref 0.0–0.1)
EOS PCT: 1 %
Eosinophils Absolute: 0.1 10*3/uL (ref 0.0–0.7)
HCT: 37.3 % (ref 36.0–46.0)
Hemoglobin: 11.6 g/dL — ABNORMAL LOW (ref 12.0–15.0)
LYMPHS ABS: 2.2 10*3/uL (ref 0.7–4.0)
Lymphocytes Relative: 22 %
MCH: 26.4 pg (ref 26.0–34.0)
MCHC: 31.1 g/dL (ref 30.0–36.0)
MCV: 84.8 fL (ref 78.0–100.0)
MONOS PCT: 6 %
Monocytes Absolute: 0.6 10*3/uL (ref 0.1–1.0)
Neutro Abs: 6.8 10*3/uL (ref 1.7–7.7)
Neutrophils Relative %: 71 %
Platelets: 291 10*3/uL (ref 150–400)
RBC: 4.4 MIL/uL (ref 3.87–5.11)
RDW: 14.3 % (ref 11.5–15.5)
WBC: 9.7 10*3/uL (ref 4.0–10.5)

## 2018-03-28 LAB — URINALYSIS, ROUTINE W REFLEX MICROSCOPIC
BILIRUBIN URINE: NEGATIVE
Glucose, UA: NEGATIVE mg/dL
Ketones, ur: NEGATIVE mg/dL
Nitrite: NEGATIVE
PH: 5.5 (ref 5.0–8.0)
Protein, ur: NEGATIVE mg/dL
Specific Gravity, Urine: 1.01 (ref 1.005–1.030)

## 2018-03-28 LAB — URINALYSIS, MICROSCOPIC (REFLEX)

## 2018-03-28 LAB — TROPONIN I: Troponin I: <0.03 ng/mL

## 2018-03-28 LAB — LIPASE, BLOOD: Lipase: 36 U/L (ref 11–51)

## 2018-03-28 MED ORDER — CEPHALEXIN 250 MG PO CAPS: 250.0000 mg | ORAL_CAPSULE | Freq: Four times a day (QID) | ORAL | 0 refills | Status: DC

## 2018-03-28 MED ORDER — ONDANSETRON HCL 4 MG/2ML IJ SOLN
4.0000 mg | Freq: Once | INTRAMUSCULAR | Status: AC
  Administered 2018-03-28: 4 mg via INTRAVENOUS
  Filled 2018-03-28: qty 2

## 2018-03-28 MED ORDER — SODIUM CHLORIDE 0.9 % IV BOLUS
1000.0000 mL | Freq: Once | INTRAVENOUS | Status: AC
  Administered 2018-03-28: 1000 mL via INTRAVENOUS

## 2018-03-28 MED ORDER — ONDANSETRON 4 MG PO TBDP: 4.0000 mg | ORAL_TABLET | Freq: Three times a day (TID) | ORAL | 0 refills | Status: DC | PRN

## 2018-03-28 MED FILL — ONDANSETRON ODT 4 MG TABLET: 4 | 6 days supply | Qty: 20 | Fill #0

## 2018-03-28 NOTE — ED Provider Notes (Signed)
[facility] Provider Note   CSN: [id number] Arrival date & time: 03/28/18  1135     History   Chief Complaint Chief Complaint  Patient presents with  . Abdominal Pain    HPI [name] is a 72 y.o. female.  HPI   Presents with 3 days of RUQ abdominal pain. Nausea present. Diarrhea today x7, now resolved.  No vomiting.  Also reports flank pain, lateral right sided pain.  Has had increased indigestion over the last month after eating. Pain not changed by eating or exertion. Indigestion does not feel like prior MI.  27mo ago did take keflex for UTI, no urinary symptoms now  Past Medical History:  Diagnosis Date  . Anginal pain ([place])   . Carotid artery disease ([place])    a. Carotid US 9/17: R 1-39%; L 40-59% >> FU 1 year  . Coronary atherosclerosis of native coronary artery    a. DES to LCx in 07/2010 b. DES to RCA in 04/2013  . Exertional shortness of breath   . Heart murmur   . Hiatal hernia   . History of blood transfusion    "after daughter was born" (05/22/2013)  . HLD (hyperlipidemia)   . HTN (hypertension)   . Hypothyroidism   . Osteoporosis   . Palpitations   . Premature ventricular contractions   . Recurrent UTI    "q 4 months for the last couple years; last one was 12/2012" (05/22/2013)  . Sinus headache   . Tremors of nervous system    by dr. love    Patient Active Problem List   Diagnosis Date Noted  . Abnormal EKG 10/19/2016  . Carotid artery disease ([place])   . Pain in the chest 02/25/2016  . Chest pain 01/27/2015  . S/P primary angioplasty with coronary stent 05/24/2013  . Hypokalemia 05/24/2013  . Hypothyroidism 08/14/2010  . Hyperlipidemia 08/14/2010  . Essential hypertension 08/14/2010  . CAD, NATIVE VESSEL 08/14/2010  . PREMATURE VENTRICULAR CONTRACTIONS 08/14/2010  . HIATAL HERNIA 08/14/2010    Past Surgical History:  Procedure Laterality Date  . APPENDECTOMY    . BREAST CYST ASPIRATION Bilateral    "numerous in the dr's office" (05/22/2013)  . BREAST CYST EXCISION Right 1965  . BREAST LUMPECTOMY Right 1980's   "benign" (05/22/2013)  . CARDIAC CATHETERIZATION  ~ 1968; 08/2010  . CORONARY ANGIOPLASTY WITH STENT PLACEMENT  07/2010  . CORONARY ANGIOPLASTY WITH STENT PLACEMENT  05/23/2013   Patent LCx stent (10-20% ISR), 70% mid RCA (FFR 0.73) s/p DES, 20% prox RCA, 40% ostial D1, 60% distal posterior AV branch; EF 65-70%  . ECTOPIC PREGNANCY SURGERY    . LAPAROTOMY  1965   "ruptured blood vessel in my side during childbirth; didn't find it til 2 days later; had to open me up to repair it" (05/22/2013)  . LEFT HEART CATHETERIZATION WITH CORONARY ANGIOGRAM N/A 05/23/2013   Procedure: LEFT HEART CATHETERIZATION WITH CORONARY ANGIOGRAM;  Surgeon: Wellington Hampshire, MD;  Location: [facility] CATH LAB;  Service: Cardiovascular;  Laterality: N/A;  . LEFT HEART CATHETERIZATION WITH CORONARY ANGIOGRAM N/A 01/28/2014   Procedure: LEFT HEART CATHETERIZATION WITH CORONARY ANGIOGRAM;  Surgeon: Blane Ohara, MD;  Location: [facility] CATH LAB;  Service: Cardiovascular;  Laterality: N/A;  . TONSILLECTOMY    . TOTAL ABDOMINAL HYSTERECTOMY       OB History   None      Home Medications    Prior to Admission medications   Medication Sig Start Date  End Date Taking? Authorizing Provider  aspirin (ASPIR-81) 81 MG EC tablet Take 81 mg by mouth daily.     Yes [provider]  esomeprazole (NEXIUM) 40 MG capsule Take 40 mg by mouth daily.     Yes [provider]  levothyroxine (SYNTHROID, LEVOTHROID) 50 MCG tablet Take 50 mcg by mouth daily before breakfast.   Yes [provider]  metoprolol tartrate (LOPRESSOR) 25 MG tablet Take 0.5 tablets (12.5 mg total) by mouth 2 (two) times daily. 02/03/18  Yes Fay Records, MD  rosuvastatin (CRESTOR) 20 MG tablet Take 1 tablet (20 mg total) by mouth at bedtime. 02/03/18  Yes Fay Records, MD  valsartan-hydrochlorothiazide (DIOVAN HCT) 160-12.5 MG tablet  Take 1 tablet by mouth daily. 07/11/15  Yes Fay Records, MD  Calcium Carbonate (CALTRATE 600) 1500 MG TABS Take 600 mg of elemental calcium by mouth 2 (two) times daily.     [provider]  cephALEXin (KEFLEX) 250 MG capsule Take 1 capsule (250 mg total) by mouth 4 (four) times daily for 10 days. 03/28/18 04/07/18  Gareth Morgan, MD  cholecalciferol (VITAMIN D) 1000 UNITS tablet Take 1,000 Units by mouth daily.    [provider]  Multiple Vitamins-Minerals (MULTIVITAL) tablet Take 1 tablet by mouth daily.      [provider]  nitroGLYCERIN (NITROSTAT) 0.4 MG SL tablet DISSOLVE ONE TABLET UNDER TONGUE AS NEEDED FOR CHEST PAIN EVERY 5 MINUTES FOR 3 DOSES 02/03/18   Fay Records, MD  ondansetron (ZOFRAN ODT) 4 MG disintegrating tablet Take 1 tablet (4 mg total) by mouth every 8 (eight) hours as needed for nausea or vomiting. 03/28/18   Gareth Morgan, MD    Family History Family History  Problem Relation Age of Onset  . CAD Mother   . Lymphoma Mother   . Heart disease Father   . Heart attack Brother 65  . Stroke Other     Social History Social History   Tobacco Use  . Smoking status: Never Smoker  . Smokeless tobacco: Never Used  Substance Use Topics  . Alcohol use: No  . Drug use: No     Allergies   Atorvastatin; Codeine; Flagyl [metronidazole hcl]; Metronidazole; Oseltamivir phosphate; Prednisone; Simvastatin; Sulfa antibiotics; Tamiflu [oseltamivir]; and Penicillins   Review of Systems Review of Systems  Respiratory: Negative for cough.   Cardiovascular: Positive for chest pain (indigestion intermittent).  Gastrointestinal: Positive for abdominal pain, diarrhea and nausea. Negative for constipation and vomiting.  Genitourinary: Negative for dysuria.     Physical Exam Updated Vital Signs BP (!) 181/78 (BP Location: Left Arm)   Pulse 88   Temp 98.6 F (37 C) (Oral)   Resp 15   Ht 5\' 2"  (1.575 m)   Wt 62.6 kg   SpO2 96%   BMI 25.24  kg/m   Physical Exam  Constitutional: She is oriented to person, place, and time. She appears well-developed and well-nourished. No distress.  HENT:  Head: Normocephalic and atraumatic.  Eyes: Conjunctivae and EOM are normal.  Neck: Normal range of motion.  Cardiovascular: Normal rate, regular rhythm, normal heart sounds and intact distal pulses. Exam reveals no gallop and no friction rub.  No murmur heard. Pulmonary/Chest: Effort normal and breath sounds normal. No respiratory distress. She has no wheezes. She has no rales.  Abdominal: Soft. She exhibits no distension. There is tenderness in the right upper quadrant. There is CVA tenderness and positive Murphy's sign. There is no guarding.  Musculoskeletal: She  exhibits no edema or tenderness.  Neurological: She is alert and oriented to person, place, and time.  Skin: Skin is warm and dry. No rash noted. She is not diaphoretic. No erythema.  Nursing note and vitals reviewed.    ED Treatments / Results  Labs (all labs ordered are listed, but only abnormal results are displayed) Labs Reviewed  CBC WITH DIFFERENTIAL/PLATELET - Abnormal; Notable for the following components:      Result Value   Hemoglobin 11.6 (*)    All other components within normal limits  COMPREHENSIVE METABOLIC PANEL - Abnormal; Notable for the following components:   Potassium 3.3 (*)    Glucose, Bld 102 (*)    All other components within normal limits  URINALYSIS, ROUTINE W REFLEX MICROSCOPIC - Abnormal; Notable for the following components:   Hgb urine dipstick SMALL (*)    Leukocytes, UA MODERATE (*)    All other components within normal limits  URINALYSIS, MICROSCOPIC (REFLEX) - Abnormal; Notable for the following components:   Bacteria, UA MANY (*)    All other components within normal limits  URINE CULTURE  LIPASE, BLOOD  TROPONIN I    EKG EKG Interpretation  Date/Time:  Tuesday March 28 2018 12:14:54 EDT Ventricular Rate:  77 PR  Interval:    QRS Duration: 89 QT Interval:  403 QTC Calculation: 457 R Axis:   30 Text Interpretation:  Sinus rhythm Abnormal R-wave progression, early transition No significant change since last tracing Confirmed by Gareth Morgan [phone]) on 03/28/2018 12:41:04 PM Also confirmed by Gareth Morgan [phone]), editor Philomena Doheny [phone])  on 03/28/2018 4:17:45 PM   Radiology US Abdomen Limited Ruq  Result Date: 03/28/2018 CLINICAL DATA:  Right upper quadrant abdominal pain with nausea and diarrhea EXAM: ULTRASOUND ABDOMEN LIMITED RIGHT UPPER QUADRANT COMPARISON:  CT 09/11/2015 FINDINGS: Gallbladder: No shadowing stones or wall thickening. Sonographer reports the patient is diffusely tender in the right upper quadrant. Common bile duct: Diameter: 3.3 mm Liver: Increased hepatic echogenicity. Multiple hypoechoic lesions within the liver, larger lesions would be compatible with cysts. The largest lesion is seen anteriorly and measures 2.5 x 2.2 x 3 cm. Smaller anterior hypoechoic lesion in the right lobe measuring 0.9 x 0.6 x 0.7 cm possibly complex cystic. Portal vein is patent on color Doppler imaging with normal direction of blood flow towards the liver. IMPRESSION: 1. Positive sonographic Percell Miller however no shadowing stones, wall thickening, or pericholecystic fluid. No biliary dilatation. 2. Increased hepatic echogenicity consistent with steatosis. There are multiple hypoechoic lesions in the liver suggestive of cysts. Electronically Signed   By: Donavan Foil M.D.   On: 03/28/2018 14:19    Procedures Procedures (including critical care time)  Medications Ordered in ED Medications  sodium chloride 0.9 % bolus 1,000 mL (0 mLs Intravenous Stopped 03/28/18 1509)  ondansetron (ZOFRAN) injection 4 mg (4 mg Intravenous Given 03/28/18 1320)     Initial Impression / Assessment and Plan / ED Course  I have reviewed the triage vital signs and the nursing notes.  Pertinent labs & imaging results that were  available during my care of the patient were reviewed by me and considered in my medical decision making (see chart for details).     72yo female with history above presents with abdominal pain.  No transaminitis or pancreatitis.  No sign of SBO or mesenteric ischemia. RUQ [place] with cysts, no sign of cholecystitis.  UA concerning for UTI. Given flank pain, UA, suspect likely pyelonephritis.  Diarrhea today may also be  viral. Doubt right sided diverticulitis in setting of more likely UTI.  EKG/troponin not suggestive of ischemia.   Given rx for keflex, zofran, recommend PCP follow up, discussed reasons to return.   Final Clinical Impressions(s) / ED Diagnoses   Final diagnoses:  Right upper quadrant abdominal pain  Pyelonephritis    ED Discharge Orders         Ordered    cephALEXin (KEFLEX) 250 MG capsule  4 times daily     03/28/18 1504    ondansetron (ZOFRAN ODT) 4 MG disintegrating tablet  Every 8 hours PRN     03/28/18 1504           Gareth Morgan, MD 03/28/18 2151

## 2018-03-28 NOTE — ED Notes (Signed)
NAD at this time. Pt is stable and going home.  

## 2018-03-28 NOTE — ED Triage Notes (Signed)
C/o RUQ pain x 3 days-nausea/diarrhea x today-NAD-slow gait

## 2018-03-29 LAB — URINE CULTURE: Culture: NO GROWTH

## 2018-04-03 DIAGNOSIS — I10 Essential (primary) hypertension: Secondary | ICD-10-CM | POA: Diagnosis not present

## 2018-04-03 DIAGNOSIS — Z23 Encounter for immunization: Secondary | ICD-10-CM | POA: Diagnosis not present

## 2018-04-03 DIAGNOSIS — E039 Hypothyroidism, unspecified: Secondary | ICD-10-CM | POA: Diagnosis not present

## 2018-04-03 DIAGNOSIS — Z0000 Encounter for general adult medical examination without abnormal findings: Secondary | ICD-10-CM | POA: Diagnosis not present

## 2018-04-03 DIAGNOSIS — K219 Gastro-esophageal reflux disease without esophagitis: Secondary | ICD-10-CM | POA: Diagnosis not present

## 2018-04-03 DIAGNOSIS — M810 Age-related osteoporosis without current pathological fracture: Secondary | ICD-10-CM | POA: Diagnosis not present

## 2018-04-03 DIAGNOSIS — E559 Vitamin D deficiency, unspecified: Secondary | ICD-10-CM | POA: Diagnosis not present

## 2018-04-07 ENCOUNTER — Emergency Department (HOSPITAL_COMMUNITY)
Admission: EM | Admit: 2018-04-07 | Discharge: 2018-04-07 | Disposition: A | Discharge status: Home / Self Care | Payer: Medicare Other | Attending: Emergency Medicine | Admitting: Emergency Medicine

## 2018-04-07 ENCOUNTER — Encounter (HOSPITAL_COMMUNITY): Payer: Self-pay | Admitting: Emergency Medicine

## 2018-04-07 ENCOUNTER — Emergency Department (HOSPITAL_COMMUNITY): Payer: Medicare Other

## 2018-04-07 ENCOUNTER — Other Ambulatory Visit: Payer: Self-pay

## 2018-04-07 DIAGNOSIS — I2510 Atherosclerotic heart disease of native coronary artery without angina pectoris: Secondary | ICD-10-CM | POA: Insufficient documentation

## 2018-04-07 DIAGNOSIS — Z955 Presence of coronary angioplasty implant and graft: Secondary | ICD-10-CM | POA: Diagnosis not present

## 2018-04-07 DIAGNOSIS — I10 Essential (primary) hypertension: Secondary | ICD-10-CM | POA: Diagnosis not present

## 2018-04-07 DIAGNOSIS — E039 Hypothyroidism, unspecified: Secondary | ICD-10-CM | POA: Insufficient documentation

## 2018-04-07 DIAGNOSIS — R531 Weakness: Secondary | ICD-10-CM | POA: Diagnosis not present

## 2018-04-07 DIAGNOSIS — Z79899 Other long term (current) drug therapy: Secondary | ICD-10-CM | POA: Diagnosis not present

## 2018-04-07 DIAGNOSIS — N390 Urinary tract infection, site not specified: Secondary | ICD-10-CM

## 2018-04-07 DIAGNOSIS — R110 Nausea: Secondary | ICD-10-CM | POA: Diagnosis not present

## 2018-04-07 DIAGNOSIS — R079 Chest pain, unspecified: Secondary | ICD-10-CM | POA: Diagnosis not present

## 2018-04-07 DIAGNOSIS — Z7982 Long term (current) use of aspirin: Secondary | ICD-10-CM | POA: Diagnosis not present

## 2018-04-07 DIAGNOSIS — R0789 Other chest pain: Secondary | ICD-10-CM | POA: Diagnosis not present

## 2018-04-07 LAB — I-STAT TROPONIN, ED
TROPONIN I, POC: 0 ng/mL (ref 0.00–0.08)
Troponin i, poc: 0 ng/mL (ref 0.00–0.08)

## 2018-04-07 LAB — BASIC METABOLIC PANEL
Anion gap: 9 (ref 5–15)
BUN: 10 mg/dL (ref 8–23)
CALCIUM: 9.3 mg/dL (ref 8.9–10.3)
CO2: 28 mmol/L (ref 22–32)
Chloride: 104 mmol/L (ref 98–111)
Creatinine, Ser: 0.84 mg/dL (ref 0.44–1.00)
GLUCOSE: 116 mg/dL — AB (ref 70–99)
Potassium: 3.5 mmol/L (ref 3.5–5.1)
Sodium: 141 mmol/L (ref 135–145)

## 2018-04-07 LAB — CBC
HCT: 35.9 % — ABNORMAL LOW (ref 36.0–46.0)
HEMOGLOBIN: 10.9 g/dL — AB (ref 12.0–15.0)
MCH: 26.3 pg (ref 26.0–34.0)
MCHC: 30.4 g/dL (ref 30.0–36.0)
MCV: 86.5 fL (ref 78.0–100.0)
Platelets: 222 10*3/uL (ref 150–400)
RBC: 4.15 MIL/uL (ref 3.87–5.11)
RDW: 14.1 % (ref 11.5–15.5)
WBC: 7.2 10*3/uL (ref 4.0–10.5)

## 2018-04-07 LAB — URINALYSIS, ROUTINE W REFLEX MICROSCOPIC
BILIRUBIN URINE: NEGATIVE
Glucose, UA: NEGATIVE mg/dL
Ketones, ur: NEGATIVE mg/dL
Nitrite: NEGATIVE
PH: 6 (ref 5.0–8.0)
PROTEIN: NEGATIVE mg/dL
SPECIFIC GRAVITY, URINE: 1.006 (ref 1.005–1.030)
WBC, UA: >50 WBC/hpf — ABNORMAL HIGH (ref 0–5)

## 2018-04-07 MED ORDER — LIDOCAINE VISCOUS HCL 2 % MT SOLN
15.0000 mL | Freq: Once | OROMUCOSAL | Status: AC
  Administered 2018-04-07: 15 mL via OROMUCOSAL
  Filled 2018-04-07: qty 15

## 2018-04-07 MED ORDER — SODIUM CHLORIDE 0.9 % IV SOLN
1.0000 g | Freq: Once | INTRAVENOUS | Status: AC
  Administered 2018-04-07: 1 g via INTRAVENOUS
  Filled 2018-04-07: qty 10

## 2018-04-07 MED ORDER — CEPHALEXIN 250 MG PO CAPS: 250.0000 mg | ORAL_CAPSULE | Freq: Four times a day (QID) | ORAL | 0 refills | Status: AC

## 2018-04-07 MED ORDER — ALUM & MAG HYDROXIDE-SIMETH 200-200-20 MG/5ML PO SUSP
15.0000 mL | Freq: Once | ORAL | Status: AC
  Administered 2018-04-07: 15 mL via ORAL
  Filled 2018-04-07: qty 30

## 2018-04-07 NOTE — ED Triage Notes (Signed)
Pt from home via [facility] c/o weakness 3 hours ago and a sudden onset of left sided CP at 6/10, pt took one of her nitro's with some relief then received 2 more nitros with EMS and pain is a 2/10 now, 324 ASA. No other complaints. VS EMS 142/94, P 82, 99% RA, RR 14.

## 2018-04-07 NOTE — Consult Note (Signed)
CARDIOLOGY CONSULT NOTE   Referring Physician: Dr. Melina Copa Primary Physician: Dr. Lona Kettle Primary Cardiologist: Dr. Dorris Carnes Reason for Consultation: Chest pain   HPI: [name] is a 72 y.o. female w/ history of CAD s/p prior PCI, HTN, HLD who presents with chest pain.   Patient describes epigastric chest pain starting approximately 530 this afternoon.  She was at home preparing dinner at the time of onset.  The pain started in her epigastrium and radiated to both shoulders.  She took 1 tablet of nitroglycerin at home with no relief.  She noticed that the pain was not related to exertion and was not positional.  The pain waxed and waned on its own over the next several hours.  She feels as though this was similar to the pain that she has had in years past.  Previously she was seen for similar pain and underwent coronary angiography.  She was found to have an obstructive lesion and underwent stenting.   In the ED, the patient's vital signs were within normal limits.  Her physical exam revealed no evidence of decompensated heart failure.  Her ECG revealed minimal ST depressions scattered throughout, which was similar to multiple prior ECGs.  An initial troponin was negative, and follow-up 3 hours later also negative.  The patient's epigastric discomfort continued to wax and wane throughout her ED course.  Review of Systems:     Cardiac Review of Systems: {Y] = yes [ ]  = no  Chest Pain [  Y  ]  Resting SOB [   ] Exertional SOB  [  ]  Orthopnea [  ]   Pedal Edema [   ]    Palpitations [  ] Syncope  [  ]   Presyncope [   ]  General Review of Systems: [Y] = yes [  ]=no Constitional: recent weight change [  ]; anorexia [  ]; fatigue [  ]; nausea [  ]; night sweats [  ]; fever [  ]; or chills [  ];                                                                     Eyes : blurred vision [  ]; diplopia [   ]; vision changes [  ];  Amaurosis fugax[  ]; Resp: cough [  ];  wheezing[  ];   hemoptysis[  ];  PND [  ];  GI:  gallstones[  ], vomiting[  ];  dysphagia[  ]; melena[  ];  hematochezia [  ]; heartburn[  ];   GU: kidney stones [  ]; hematuria[  ];   dysuria [  ];  nocturia[  ]; incontinence [  ];             Skin: rash, swelling[  ];, hair loss[  ];  peripheral edema[  ];  or itching[  ]; Musculosketetal: myalgias[  ];  joint swelling[  ];  joint erythema[  ];  joint pain[  ];  back pain[  ];  Heme/Lymph: bruising[  ];  bleeding[  ];  anemia[  ];  Neuro: TIA[  ];  headaches[  ];  stroke[  ];  vertigo[  ];  seizures[  ];  paresthesias[  ];  difficulty walking[  ];  Psych:depression[  ]; anxiety[  ];  Endocrine: diabetes[  ];  thyroid dysfunction[  ];  Other:  Past Medical History:  Diagnosis Date  . Anginal pain ([place])   . Carotid artery disease ([place])    a. Carotid US 9/17: R 1-39%; L 40-59% >> FU 1 year  . Coronary atherosclerosis of native coronary artery    a. DES to LCx in 07/2010 b. DES to RCA in 04/2013  . Exertional shortness of breath   . Heart murmur   . Hiatal hernia   . History of blood transfusion    "after daughter was born" (05/22/2013)  . HLD (hyperlipidemia)   . HTN (hypertension)   . Hypothyroidism   . Osteoporosis   . Palpitations   . Premature ventricular contractions   . Recurrent UTI    "q 4 months for the last couple years; last one was 12/2012" (05/22/2013)  . Sinus headache   . Tremors of nervous system    by dr. love     Infusions:   Allergies  Allergen Reactions  . Atorvastatin     REACTION: myaligia  . Codeine Hives and Rash  . Flagyl [Metronidazole Hcl] Anaphylaxis  . Metronidazole Nausea And Vomiting, Rash and Other (See Comments)    Chest pain   . Oseltamivir Phosphate Itching  . Prednisone Palpitations    Makes my heart race  . Simvastatin Other (See Comments)    REACTION: myalgia  . Sulfa Antibiotics Other (See Comments) and Rash    Jittery  . Tamiflu [Oseltamivir] Itching  . Penicillins Nausea And Vomiting  and Rash    Social History   Socioeconomic History  . Marital status: Married    Spouse name: Not on file  . Number of children: Not on file  . Years of education: Not on file  . Highest education level: Not on file  Occupational History  . Occupation: Retired Oceanographer  Social Needs  . Financial resource strain: Not on file  . Food insecurity:    Worry: Not on file    Inability: Not on file  . Transportation needs:    Medical: Not on file    Non-medical: Not on file  Tobacco Use  . Smoking status: Never Smoker  . Smokeless tobacco: Never Used  Substance and Sexual Activity  . Alcohol use: No  . Drug use: No  . Sexual activity: Not on file  Lifestyle  . Physical activity:    Days per week: Not on file    Minutes per session: Not on file  . Stress: Not on file  Relationships  . Social connections:    Talks on phone: Not on file    Gets together: Not on file    Attends religious service: Not on file    Active member of club or organization: Not on file    Attends meetings of clubs or organizations: Not on file    Relationship status: Not on file  . Intimate partner violence:    Fear of current or ex partner: Not on file    Emotionally abused: Not on file    Physically abused: Not on file    Forced sexual activity: Not on file  Other Topics Concern  . Not on file  Social History Narrative  . Not on file    Family History  Problem Relation Age of Onset  . CAD Mother   . Lymphoma Mother   . Heart  disease Father   . Heart attack Brother 50  . Stroke Other     PHYSICAL EXAM: Vitals:   04/07/18 2215 04/07/18 2330  BP: (!) 148/65 (!) 152/62  Pulse: 79 74  Resp: 19 15  Temp:    SpO2: 96% 98%    No intake or output data in the 24 hours ending 04/07/18 2333  General:  Well appearing. No respiratory difficulty HEENT: normal Neck: supple. no JVD. Carotids 2+ bilat; no bruits. No lymphadenopathy or thryomegaly appreciated. Cor: PMI nondisplaced.  Regular rate & rhythm. No rubs, gallops or murmurs. Lungs: clear Abdomen: soft, nontender, nondistended. No hepatosplenomegaly. No bruits or masses. Good bowel sounds. Extremities: no cyanosis, clubbing, rash, edema Neuro: alert & oriented x 3, cranial nerves grossly intact. moves all 4 extremities w/o difficulty. Affect pleasant.  ECG: NSR, borderline LVH, sub-mm ST depressions scattered throughout but similar to multiple prior ECG's  Results for orders placed or performed during the hospital encounter of 04/07/18 (from the past 24 hour(s))  I-stat troponin, ED     Status: None   Collection Time: 04/07/18  7:46 PM  Result Value Ref Range   Troponin i, poc 0.00 0.00 - 0.08 ng/mL   Comment 3          Basic metabolic panel     Status: Abnormal   Collection Time: 04/07/18  7:50 PM  Result Value Ref Range   Sodium 141 135 - 145 mmol/L   Potassium 3.5 3.5 - 5.1 mmol/L   Chloride 104 98 - 111 mmol/L   CO2 28 22 - 32 mmol/L   Glucose, Bld 116 (H) 70 - 99 mg/dL   BUN 10 8 - 23 mg/dL   Creatinine, Ser 0.84 0.44 - 1.00 mg/dL   Calcium 9.3 8.9 - 10.3 mg/dL   GFR calc non Af Amer >60 >60 mL/min   GFR calc Af Amer >60 >60 mL/min   Anion gap 9 5 - 15  CBC     Status: Abnormal   Collection Time: 04/07/18  7:50 PM  Result Value Ref Range   WBC 7.2 4.0 - 10.5 K/uL   RBC 4.15 3.87 - 5.11 MIL/uL   Hemoglobin 10.9 (L) 12.0 - 15.0 g/dL   HCT 35.9 (L) 36.0 - 46.0 %   MCV 86.5 78.0 - 100.0 fL   MCH 26.3 26.0 - 34.0 pg   MCHC 30.4 30.0 - 36.0 g/dL   RDW 14.1 11.5 - 15.5 %   Platelets 222 150 - 400 K/uL  Urinalysis, Routine w reflex microscopic     Status: Abnormal   Collection Time: 04/07/18  8:36 PM  Result Value Ref Range   Color, Urine YELLOW YELLOW   APPearance HAZY (A) CLEAR   Specific Gravity, Urine 1.006 1.005 - 1.030   pH 6.0 5.0 - 8.0   Glucose, UA NEGATIVE NEGATIVE mg/dL   Hgb urine dipstick MODERATE (A) NEGATIVE   Bilirubin Urine NEGATIVE NEGATIVE   Ketones, ur NEGATIVE NEGATIVE  mg/dL   Protein, ur NEGATIVE NEGATIVE mg/dL   Nitrite NEGATIVE NEGATIVE   Leukocytes, UA LARGE (A) NEGATIVE   RBC / HPF 6-10 0 - 5 RBC/hpf   WBC, UA >50 (H) 0 - 5 WBC/hpf   Bacteria, UA FEW (A) NONE SEEN   Squamous Epithelial / LPF 0-5 0 - 5   Non Squamous Epithelial 0-5 (A) NONE SEEN  I-stat troponin, ED     Status: None   Collection Time: 04/07/18 10:56 PM  Result Value Ref Range  Troponin i, poc 0.00 0.00 - 0.08 ng/mL   Comment 3           Dg Chest 2 View  Result Date: 04/07/2018 CLINICAL DATA:  Acute left-sided chest pain tonight. EXAM: CHEST - 2 VIEW COMPARISON:  12/07/2016 FINDINGS: Lungs are adequately inflated and otherwise clear. Mild stable cardiomegaly. Remainder of the exam is unchanged IMPRESSION: No active cardiopulmonary disease. Electronically Signed   By: Marin Olp M.D.   On: 04/07/2018 20:22     ASSESSMENT & Plan:  CRUZ DEVILLA is a 72 y.o. female w/ history of CAD s/p prior PCI, HTN, HLD who presents with chest pain.  There are multiple atypical features of the patient's presenting symptoms, including the fact that her pain is been essentially constant for several hours now and is unrelated to exertion.  She does have a known history of hiatal hernia and feels as though her symptoms may be related to this.  Despite the atypical nature of her symptoms, she does have multiple risk factors for ischemic chest pain.  She has a known history of coronary disease and has undergone PCI in the past.  She also has hypertension and hyperlipidemia.  Her ECG and multiple negative troponins are very reassuring.  I had an extensive discussion with the patient and her family about her case.  I explained to them that she is in the intermediate risk category and that it would be reasonable to have her admitted for observation and undergo stress testing in the morning.  Further, if she wanted to go home and pursue outpatient work-up that would also be reasonable.  The patient expressed  a desire to go home and contact Dr. Harrington Challenger about potentially undergoing stress testing in the outpatient setting.  I told her that I would be happy to send her a copy of my consult note so that she is aware of her ED visit.  The patient understands that she should return to the closest ED immediately if her symptoms worsen or if she develops any new symptoms between now and the time that she is seen in the outpatient setting.  She will continue to take all of her medications as prescribed.  A copy of this note will be sent to Dr. Harrington Challenger for review.  Marcie Mowers, MD Cardiology Fellow, PGY-6

## 2018-04-07 NOTE — ED Provider Notes (Signed)
[facility] EMERGENCY DEPARTMENT Provider Note   CSN: [id number] Arrival date & time: 04/07/18  1853     History   Chief Complaint Chief Complaint  Patient presents with  . Chest Pain    HPI [name] is a 72 y.o. female.  She is complaining of some left-sided chest pain that radiated to her left clavicle that started around 530 tonight while at rest.  She calls it a pressure was associated with some nausea and feeling generally weak all over.  She states that this is similar but not as bad to when she needed cardiac stents the first time.  Her last intervention was in 2015.  She was evaluated about a week ago for some abdominal discomfort and was found to have a urine infection.  She just finished her last dose of antibiotics yesterday.  She rates her chest pain right now is a 4 out of 10.  The history is provided by the patient.  Chest Pain   This is a new problem. The current episode started 1 to 2 hours ago. The problem occurs constantly. The problem has been gradually improving. The pain is present in the substernal region. The pain is at a severity of 4/10. The quality of the pain is described as pressure-like. The pain radiates to the left neck and left shoulder. Associated symptoms include abdominal pain, nausea and weakness. Pertinent negatives include no cough, no fever, no shortness of breath and no vomiting. She has tried nitroglycerin for the symptoms. The treatment provided mild relief.  Her past medical history is significant for CAD and hypertension.  Procedure history is positive for cardiac catheterization.    Past Medical History:  Diagnosis Date  . Anginal pain ([place])   . Carotid artery disease ([place])    a. Carotid US 9/17: R 1-39%; L 40-59% >> FU 1 year  . Coronary atherosclerosis of native coronary artery    a. DES to LCx in 07/2010 b. DES to RCA in 04/2013  . Exertional shortness of breath   . Heart murmur   . Hiatal hernia   . History  of blood transfusion    "after daughter was born" (05/22/2013)  . HLD (hyperlipidemia)   . HTN (hypertension)   . Hypothyroidism   . Osteoporosis   . Palpitations   . Premature ventricular contractions   . Recurrent UTI    "q 4 months for the last couple years; last one was 12/2012" (05/22/2013)  . Sinus headache   . Tremors of nervous system    by dr. love    Patient Active Problem List   Diagnosis Date Noted  . Abnormal EKG 10/19/2016  . Carotid artery disease ([place])   . Pain in the chest 02/25/2016  . Chest pain 01/27/2015  . S/P primary angioplasty with coronary stent 05/24/2013  . Hypokalemia 05/24/2013  . Hypothyroidism 08/14/2010  . Hyperlipidemia 08/14/2010  . Essential hypertension 08/14/2010  . CAD, NATIVE VESSEL 08/14/2010  . PREMATURE VENTRICULAR CONTRACTIONS 08/14/2010  . HIATAL HERNIA 08/14/2010    Past Surgical History:  Procedure Laterality Date  . APPENDECTOMY    . BREAST CYST ASPIRATION Bilateral    "numerous in the dr's office" (05/22/2013)  . BREAST CYST EXCISION Right 1965  . BREAST LUMPECTOMY Right 1980's   "benign" (05/22/2013)  . CARDIAC CATHETERIZATION  ~ 1968; 08/2010  . CORONARY ANGIOPLASTY WITH STENT PLACEMENT  07/2010  . CORONARY ANGIOPLASTY WITH STENT PLACEMENT  05/23/2013   Patent LCx stent (10-20%  ISR), 70% mid RCA (FFR 0.73) s/p DES, 20% prox RCA, 40% ostial D1, 60% distal posterior AV branch; EF 65-70%  . ECTOPIC PREGNANCY SURGERY    . LAPAROTOMY  1965   "ruptured blood vessel in my side during childbirth; didn't find it til 2 days later; had to open me up to repair it" (05/22/2013)  . LEFT HEART CATHETERIZATION WITH CORONARY ANGIOGRAM N/A 05/23/2013   Procedure: LEFT HEART CATHETERIZATION WITH CORONARY ANGIOGRAM;  Surgeon: Wellington Hampshire, MD;  Location: [facility] CATH LAB;  Service: Cardiovascular;  Laterality: N/A;  . LEFT HEART CATHETERIZATION WITH CORONARY ANGIOGRAM N/A 01/28/2014   Procedure: LEFT HEART CATHETERIZATION WITH CORONARY  ANGIOGRAM;  Surgeon: Blane Ohara, MD;  Location: [facility] CATH LAB;  Service: Cardiovascular;  Laterality: N/A;  . TONSILLECTOMY    . TOTAL ABDOMINAL HYSTERECTOMY       OB History   None      Home Medications    Prior to Admission medications   Medication Sig Start Date End Date Taking? Authorizing Provider  aspirin (ASPIR-81) 81 MG EC tablet Take 81 mg by mouth daily.      [provider]  Calcium Carbonate (CALTRATE 600) 1500 MG TABS Take 600 mg of elemental calcium by mouth 2 (two) times daily.     [provider]  cephALEXin (KEFLEX) 250 MG capsule Take 1 capsule (250 mg total) by mouth 4 (four) times daily for 10 days. 03/28/18 04/07/18  Gareth Morgan, MD  cholecalciferol (VITAMIN D) 1000 UNITS tablet Take 1,000 Units by mouth daily.    [provider]  esomeprazole (NEXIUM) 40 MG capsule Take 40 mg by mouth daily.      [provider]  levothyroxine (SYNTHROID, LEVOTHROID) 50 MCG tablet Take 50 mcg by mouth daily before breakfast.    [provider]  metoprolol tartrate (LOPRESSOR) 25 MG tablet Take 0.5 tablets (12.5 mg total) by mouth 2 (two) times daily. 02/03/18   Fay Records, MD  Multiple Vitamins-Minerals (MULTIVITAL) tablet Take 1 tablet by mouth daily.      [provider]  nitroGLYCERIN (NITROSTAT) 0.4 MG SL tablet DISSOLVE ONE TABLET UNDER TONGUE AS NEEDED FOR CHEST PAIN EVERY 5 MINUTES FOR 3 DOSES 02/03/18   Fay Records, MD  ondansetron (ZOFRAN ODT) 4 MG disintegrating tablet Take 1 tablet (4 mg total) by mouth every 8 (eight) hours as needed for nausea or vomiting. 03/28/18   Gareth Morgan, MD  rosuvastatin (CRESTOR) 20 MG tablet Take 1 tablet (20 mg total) by mouth at bedtime. 02/03/18   Fay Records, MD  valsartan-hydrochlorothiazide (DIOVAN HCT) 160-12.5 MG tablet Take 1 tablet by mouth daily. 07/11/15   Fay Records, MD    Family History Family History  Problem Relation Age of Onset  . CAD Mother   .  Lymphoma Mother   . Heart disease Father   . Heart attack Brother 74  . Stroke Other     Social History Social History   Tobacco Use  . Smoking status: Never Smoker  . Smokeless tobacco: Never Used  Substance Use Topics  . Alcohol use: No  . Drug use: No     Allergies   Atorvastatin; Codeine; Flagyl [metronidazole hcl]; Metronidazole; Oseltamivir phosphate; Prednisone; Simvastatin; Sulfa antibiotics; Tamiflu [oseltamivir]; and Penicillins   Review of Systems Review of Systems  Constitutional: Negative for fever.  HENT: Negative for sore throat.   Eyes: Negative for visual disturbance.  Respiratory: Negative for cough and shortness of breath.  Cardiovascular: Positive for chest pain.  Gastrointestinal: Positive for abdominal pain and nausea. Negative for vomiting.  Genitourinary: Negative for dysuria.  Musculoskeletal: Negative for gait problem.  Skin: Negative for rash.  Neurological: Positive for weakness.     Physical Exam Updated Vital Signs BP (!) 104/91 (BP Location: Right Arm)   Pulse 85   Temp 98 F (36.7 C) (Oral)   Resp 17   Ht 5\' 2"  (1.575 m)   Wt 62.5 kg   SpO2 100%   BMI 25.20 kg/m   Physical Exam  Constitutional: She appears well-developed and well-nourished. No distress.  HENT:  Head: Normocephalic and atraumatic.  Eyes: Conjunctivae are normal.  Neck: Neck supple.  Cardiovascular: Normal rate, regular rhythm and normal pulses.  No murmur heard. Pulmonary/Chest: Effort normal and breath sounds normal. No respiratory distress.  Abdominal: Soft. There is no tenderness.  Musculoskeletal: She exhibits no edema.       Right lower leg: She exhibits no tenderness.       Left lower leg: She exhibits no tenderness.  Neurological: She is alert.  Skin: Skin is warm and dry. Capillary refill takes less than 2 seconds.  Psychiatric: She has a normal mood and affect.  Nursing note and vitals reviewed.    ED Treatments / Results  Labs (all labs  ordered are listed, but only abnormal results are displayed) Labs Reviewed  BASIC METABOLIC PANEL - Abnormal; Notable for the following components:      Result Value   Glucose, Bld 116 (*)    All other components within normal limits  CBC - Abnormal; Notable for the following components:   Hemoglobin 10.9 (*)    HCT 35.9 (*)    All other components within normal limits  URINALYSIS, ROUTINE W REFLEX MICROSCOPIC - Abnormal; Notable for the following components:   APPearance HAZY (*)    Hgb urine dipstick MODERATE (*)    Leukocytes, UA LARGE (*)    WBC, UA >50 (*)    Bacteria, UA FEW (*)    Non Squamous Epithelial 0-5 (*)    All other components within normal limits  URINE CULTURE  I-STAT TROPONIN, ED  I-STAT TROPONIN, ED    EKG EKG Interpretation  Date/Time:  Friday April 07 2018 19:01:32 EDT Ventricular Rate:  77 PR Interval:    QRS Duration: 87 QT Interval:  407 QTC Calculation: 461 R Axis:   26 Text Interpretation:  Sinus rhythm similar to prior 9/19 Confirmed by Aletta Edouard [phone]) on 04/07/2018 7:21:59 PM   Radiology Dg Chest 2 View  Result Date: 04/07/2018 CLINICAL DATA:  Acute left-sided chest pain tonight. EXAM: CHEST - 2 VIEW COMPARISON:  12/07/2016 FINDINGS: Lungs are adequately inflated and otherwise clear. Mild stable cardiomegaly. Remainder of the exam is unchanged IMPRESSION: No active cardiopulmonary disease. Electronically Signed   By: Marin Olp M.D.   On: 04/07/2018 20:22    Procedures Procedures (including critical care time)  Medications Ordered in ED Medications  alum & mag hydroxide-simeth (MAALOX/MYLANTA) 200-200-20 MG/5ML suspension 15 mL (15 mLs Oral Given 04/07/18 2050)  lidocaine (XYLOCAINE) 2 % viscous mouth solution 15 mL (15 mLs Mouth/Throat Given 04/07/18 2050)  cefTRIAXone (ROCEPHIN) 1 g in sodium chloride 0.9 % 100 mL IVPB (0 g Intravenous Stopped 04/07/18 2314)     Initial Impression / Assessment and Plan / ED Course  I have  reviewed the triage vital signs and the nursing notes.  Pertinent labs & imaging results that were available during my care  of the patient were reviewed by me and considered in my medical decision making (see chart for details).  Clinical Course as of Apr 08 1109  Fri Apr 07, 2018  2226 She was seen by cardiology and offered admission but she would rather go home.  We discussed work-up for her white blood cells in her urine and she is agreeable to a dose of IV antibiotics and go home on the Keflex which she seemed to respond to.  Of asked if they could get a urine culture on her though as I do not see any prior ones to give me any guidance on what she is grown out before.   [MB]    Clinical Course User Index [MB] Hayden Rasmussen, MD     Final Clinical Impressions(s) / ED Diagnoses   Final diagnoses:  Nonspecific chest pain  Urinary tract infection in female    ED Discharge Orders    None       Hayden Rasmussen, MD 04/08/18 1110

## 2018-04-07 NOTE — Discharge Instructions (Addendum)
Your evaluated in the emergency department for chest pain.  You had an EKG blood work and chest x-ray that did not show an obvious cause of your pain.  You were evaluated by cardiology here and they thought you could follow-up with your cardiologist next week.  You also have a possible urine infection and we gave you an IV dose of some antibiotics.  We are going to send off a urine culture so we are putting you back on the Keflex for now but that may need to be changed.  Please let your primary care doctor know about this.

## 2018-04-09 ENCOUNTER — Emergency Department (HOSPITAL_BASED_OUTPATIENT_CLINIC_OR_DEPARTMENT_OTHER)
Admission: EM | Admit: 2018-04-09 | Discharge: 2018-04-09 | Disposition: A | Discharge status: Home / Self Care | Payer: Medicare Other | Attending: Emergency Medicine | Admitting: Emergency Medicine

## 2018-04-09 ENCOUNTER — Emergency Department (HOSPITAL_BASED_OUTPATIENT_CLINIC_OR_DEPARTMENT_OTHER): Payer: Medicare Other

## 2018-04-09 ENCOUNTER — Other Ambulatory Visit: Payer: Self-pay

## 2018-04-09 ENCOUNTER — Encounter (HOSPITAL_BASED_OUTPATIENT_CLINIC_OR_DEPARTMENT_OTHER): Payer: Self-pay | Admitting: *Deleted

## 2018-04-09 DIAGNOSIS — Z7982 Long term (current) use of aspirin: Secondary | ICD-10-CM | POA: Insufficient documentation

## 2018-04-09 DIAGNOSIS — E039 Hypothyroidism, unspecified: Secondary | ICD-10-CM | POA: Diagnosis not present

## 2018-04-09 DIAGNOSIS — N3090 Cystitis, unspecified without hematuria: Secondary | ICD-10-CM | POA: Diagnosis not present

## 2018-04-09 DIAGNOSIS — E860 Dehydration: Secondary | ICD-10-CM | POA: Insufficient documentation

## 2018-04-09 DIAGNOSIS — I2510 Atherosclerotic heart disease of native coronary artery without angina pectoris: Secondary | ICD-10-CM | POA: Insufficient documentation

## 2018-04-09 DIAGNOSIS — I10 Essential (primary) hypertension: Secondary | ICD-10-CM | POA: Insufficient documentation

## 2018-04-09 DIAGNOSIS — R197 Diarrhea, unspecified: Secondary | ICD-10-CM | POA: Insufficient documentation

## 2018-04-09 DIAGNOSIS — K5730 Diverticulosis of large intestine without perforation or abscess without bleeding: Secondary | ICD-10-CM | POA: Diagnosis not present

## 2018-04-09 DIAGNOSIS — Z79899 Other long term (current) drug therapy: Secondary | ICD-10-CM | POA: Diagnosis not present

## 2018-04-09 DIAGNOSIS — R42 Dizziness and giddiness: Secondary | ICD-10-CM | POA: Diagnosis not present

## 2018-04-09 DIAGNOSIS — R110 Nausea: Secondary | ICD-10-CM | POA: Diagnosis present

## 2018-04-09 LAB — COMPREHENSIVE METABOLIC PANEL
ALT: 30 U/L (ref 0–44)
AST: 46 U/L — ABNORMAL HIGH (ref 15–41)
Albumin: 4.2 g/dL (ref 3.5–5.0)
Alkaline Phosphatase: 73 U/L (ref 38–126)
Anion gap: 11 (ref 5–15)
BUN: 10 mg/dL (ref 8–23)
CHLORIDE: 100 mmol/L (ref 98–111)
CO2: 29 mmol/L (ref 22–32)
Calcium: 9.4 mg/dL (ref 8.9–10.3)
Creatinine, Ser: 0.79 mg/dL (ref 0.44–1.00)
Glucose, Bld: 111 mg/dL — ABNORMAL HIGH (ref 70–99)
Potassium: 3.3 mmol/L — ABNORMAL LOW (ref 3.5–5.1)
SODIUM: 140 mmol/L (ref 135–145)
Total Bilirubin: 1.1 mg/dL (ref 0.3–1.2)
Total Protein: 7.5 g/dL (ref 6.5–8.1)

## 2018-04-09 LAB — LIPASE, BLOOD: Lipase: 38 U/L (ref 11–51)

## 2018-04-09 LAB — URINALYSIS, ROUTINE W REFLEX MICROSCOPIC
BILIRUBIN URINE: NEGATIVE
GLUCOSE, UA: NEGATIVE mg/dL
KETONES UR: NEGATIVE mg/dL
Nitrite: NEGATIVE
PROTEIN: NEGATIVE mg/dL
Specific Gravity, Urine: <1.005 — ABNORMAL LOW (ref 1.005–1.030)
pH: 6 (ref 5.0–8.0)

## 2018-04-09 LAB — URINALYSIS, MICROSCOPIC (REFLEX)

## 2018-04-09 LAB — CBC WITH DIFFERENTIAL/PLATELET
Basophils Absolute: 0 10*3/uL (ref 0.0–0.1)
Basophils Relative: 0 %
EOS ABS: 0.1 10*3/uL (ref 0.0–0.7)
Eosinophils Relative: 1 %
HEMATOCRIT: 38.9 % (ref 36.0–46.0)
HEMOGLOBIN: 12.1 g/dL (ref 12.0–15.0)
LYMPHS ABS: 1.6 10*3/uL (ref 0.7–4.0)
Lymphocytes Relative: 17 %
MCH: 26.2 pg (ref 26.0–34.0)
MCHC: 31.1 g/dL (ref 30.0–36.0)
MCV: 84.2 fL (ref 78.0–100.0)
MONOS PCT: 6 %
Monocytes Absolute: 0.6 10*3/uL (ref 0.1–1.0)
NEUTROS ABS: 7.4 10*3/uL (ref 1.7–7.7)
NEUTROS PCT: 76 %
Platelets: 287 10*3/uL (ref 150–400)
RBC: 4.62 MIL/uL (ref 3.87–5.11)
RDW: 14.7 % (ref 11.5–15.5)
WBC: 9.7 10*3/uL (ref 4.0–10.5)

## 2018-04-09 LAB — URINE CULTURE: CULTURE: NO GROWTH

## 2018-04-09 MED ORDER — LOPERAMIDE HCL 2 MG PO TABS: 2.0000 mg | ORAL_TABLET | Freq: Four times a day (QID) | ORAL | 0 refills | Status: DC | PRN

## 2018-04-09 MED ORDER — SODIUM CHLORIDE 0.9 % IV SOLN
INTRAVENOUS | Status: DC
  Administered 2018-04-09: 13:00:00 via INTRAVENOUS

## 2018-04-09 MED ORDER — ONDANSETRON HCL 4 MG/2ML IJ SOLN
4.0000 mg | Freq: Once | INTRAMUSCULAR | Status: AC
  Administered 2018-04-09: 4 mg via INTRAVENOUS
  Filled 2018-04-09: qty 2

## 2018-04-09 MED ORDER — IOPAMIDOL (ISOVUE-300) INJECTION 61%
100.0000 mL | Freq: Once | INTRAVENOUS | Status: AC | PRN
  Administered 2018-04-09: 100 mL via INTRAVENOUS

## 2018-04-09 MED ORDER — ONDANSETRON 4 MG PO TBDP: 4.0000 mg | ORAL_TABLET | Freq: Three times a day (TID) | ORAL | 1 refills | Status: DC | PRN

## 2018-04-09 MED ORDER — SODIUM CHLORIDE 0.9 % IV BOLUS
500.0000 mL | Freq: Once | INTRAVENOUS | Status: AC
  Administered 2018-04-09: 500 mL via INTRAVENOUS

## 2018-04-09 NOTE — Discharge Instructions (Signed)
Today's work-up without any significant findings.  The white blood cells in the urine have decreased at this point.  As we discussed your last 2 urine cultures have been negative.  We did send a another one off today.  Okay at this point to stop the Keflex.  Take the Zofran as directed.  Take the Imodium as needed for the diarrhea.  Make an appointment to follow-up with your primary care doctor as well.  Return for any new or worse symptoms.

## 2018-04-09 NOTE — ED Notes (Signed)
Patient is not able to defecate.

## 2018-04-09 NOTE — ED Provider Notes (Signed)
[facility] EMERGENCY DEPARTMENT Provider Note   CSN: [id number] Arrival date & time: 04/09/18  1016     History   Chief Complaint Chief Complaint  Patient presents with  . Nausea, Diarrhea    HPI [name] is a 72 y.o. female.  Patient presents for similar symptoms that started prior to September 3.  Patient was seen in the emergency department September 3 and September 13.  On September 3 patient was felt to have pyelonephritis due to significant white blood cells in her urine.  Patient's had persistent nausea no real vomiting loose bowel movements decreased appetite fatigue little bit of lightheadedness.  No blood in the bowel movements.  On September 13 patient still had white blood cells in her urine so she was dosed with Rocephin and continued on the Keflex.  Patient also had chest pain at that time a significant chest pain was ruled out but she was offered the option by cardiology for admission for stress test.  But that she opted to go home which was okay by them.  She will work on this as an outpatient.  Patient is stated she was not feeling well enough at that time.  Patient and family do not understand why she is not getting better.  Review of labs show that the urine cultures from September 3 and 13 did not grow any bacteria.  They were negative cultures.     Past Medical History:  Diagnosis Date  . Anginal pain ([place])   . Carotid artery disease ([place])    a. Carotid US 9/17: R 1-39%; L 40-59% >> FU 1 year  . Coronary atherosclerosis of native coronary artery    a. DES to LCx in 07/2010 b. DES to RCA in 04/2013  . Exertional shortness of breath   . Heart murmur   . Hiatal hernia   . History of blood transfusion    "after daughter was born" (05/22/2013)  . HLD (hyperlipidemia)   . HTN (hypertension)   . Hypothyroidism   . Osteoporosis   . Palpitations   . Premature ventricular contractions   . Recurrent UTI    "q 4 months for the last couple years;  last one was 12/2012" (05/22/2013)  . Sinus headache   . Tremors of nervous system    by dr. love    Patient Active Problem List   Diagnosis Date Noted  . Abnormal EKG 10/19/2016  . Carotid artery disease ([place])   . Pain in the chest 02/25/2016  . Chest pain 01/27/2015  . S/P primary angioplasty with coronary stent 05/24/2013  . Hypokalemia 05/24/2013  . Hypothyroidism 08/14/2010  . Hyperlipidemia 08/14/2010  . Essential hypertension 08/14/2010  . CAD, NATIVE VESSEL 08/14/2010  . PREMATURE VENTRICULAR CONTRACTIONS 08/14/2010  . HIATAL HERNIA 08/14/2010    Past Surgical History:  Procedure Laterality Date  . APPENDECTOMY    . BREAST CYST ASPIRATION Bilateral    "numerous in the dr's office" (05/22/2013)  . BREAST CYST EXCISION Right 1965  . BREAST LUMPECTOMY Right 1980's   "benign" (05/22/2013)  . CARDIAC CATHETERIZATION  ~ 1968; 08/2010  . CORONARY ANGIOPLASTY WITH STENT PLACEMENT  07/2010  . CORONARY ANGIOPLASTY WITH STENT PLACEMENT  05/23/2013   Patent LCx stent (10-20% ISR), 70% mid RCA (FFR 0.73) s/p DES, 20% prox RCA, 40% ostial D1, 60% distal posterior AV branch; EF 65-70%  . ECTOPIC PREGNANCY SURGERY    . LAPAROTOMY  1965   "ruptured blood vessel in my side during  childbirth; didn't find it til 2 days later; had to open me up to repair it" (05/22/2013)  . LEFT HEART CATHETERIZATION WITH CORONARY ANGIOGRAM N/A 05/23/2013   Procedure: LEFT HEART CATHETERIZATION WITH CORONARY ANGIOGRAM;  Surgeon: Wellington Hampshire, MD;  Location: [facility] CATH LAB;  Service: Cardiovascular;  Laterality: N/A;  . LEFT HEART CATHETERIZATION WITH CORONARY ANGIOGRAM N/A 01/28/2014   Procedure: LEFT HEART CATHETERIZATION WITH CORONARY ANGIOGRAM;  Surgeon: Blane Ohara, MD;  Location: [facility] CATH LAB;  Service: Cardiovascular;  Laterality: N/A;  . TONSILLECTOMY    . TOTAL ABDOMINAL HYSTERECTOMY       OB History    Gravida  3   Para  2   Term      Preterm      AB      Living  2     SAB        TAB      Ectopic      Multiple      Live Births               Home Medications    Prior to Admission medications   Medication Sig Start Date End Date Taking? Authorizing Provider  aspirin (ASPIR-81) 81 MG EC tablet Take 81 mg by mouth daily.     Yes [provider]  Calcium Carbonate (CALTRATE 600) 1500 MG TABS Take 600 mg of elemental calcium by mouth 2 (two) times daily.    Yes [provider]  cephALEXin (KEFLEX) 250 MG capsule Take 1 capsule (250 mg total) by mouth 4 (four) times daily for 7 days. 04/07/18 04/14/18 Yes Hayden Rasmussen, MD  esomeprazole (NEXIUM) 40 MG capsule Take 40 mg by mouth daily.     Yes [provider]  levothyroxine (SYNTHROID, LEVOTHROID) 50 MCG tablet Take 50 mcg by mouth daily before breakfast.   Yes [provider]  metoprolol tartrate (LOPRESSOR) 25 MG tablet Take 0.5 tablets (12.5 mg total) by mouth 2 (two) times daily. 02/03/18  Yes Fay Records, MD  Multiple Vitamins-Minerals (MULTIVITAL) tablet Take 1 tablet by mouth daily.     Yes [provider]  rosuvastatin (CRESTOR) 20 MG tablet Take 1 tablet (20 mg total) by mouth at bedtime. 02/03/18  Yes Fay Records, MD  valsartan-hydrochlorothiazide (DIOVAN HCT) 160-12.5 MG tablet Take 1 tablet by mouth daily. 07/11/15  Yes Fay Records, MD  loperamide (IMODIUM A-D) 2 MG tablet Take 1 tablet (2 mg total) by mouth 4 (four) times daily as needed for diarrhea or loose stools. 04/09/18   Fredia Sorrow, MD  nitroGLYCERIN (NITROSTAT) 0.4 MG SL tablet DISSOLVE ONE TABLET UNDER TONGUE AS NEEDED FOR CHEST PAIN EVERY 5 MINUTES FOR 3 DOSES 02/03/18   Fay Records, MD  ondansetron (ZOFRAN ODT) 4 MG disintegrating tablet Take 1 tablet (4 mg total) by mouth every 8 (eight) hours as needed for nausea or vomiting. Patient not taking: Reported on 04/07/2018 03/28/18   Gareth Morgan, MD  ondansetron (ZOFRAN ODT) 4 MG disintegrating tablet Take 1 tablet (4 mg total) by mouth  every 8 (eight) hours as needed. 04/09/18   Fredia Sorrow, MD    Family History Family History  Problem Relation Age of Onset  . CAD Mother   . Lymphoma Mother   . Heart disease Father   . Heart attack Brother 62  . Stroke Other     Social History Social History   Tobacco Use  . Smoking status: Never Smoker  .  Smokeless tobacco: Never Used  Substance Use Topics  . Alcohol use: No  . Drug use: No     Allergies   Atorvastatin; Codeine; Flagyl [metronidazole hcl]; Metronidazole; Oseltamivir phosphate; Prednisone; Simvastatin; Sulfa antibiotics; Tamiflu [oseltamivir]; and Penicillins   Review of Systems Review of Systems  Constitutional: Negative for fever.  HENT: Negative for congestion.   Eyes: Negative for visual disturbance.  Respiratory: Negative for shortness of breath.   Gastrointestinal: Positive for abdominal pain, diarrhea and nausea. Negative for vomiting.  Genitourinary: Negative for dysuria.  Musculoskeletal: Negative for back pain.  Skin: Negative for rash.  Neurological: Positive for light-headedness. Negative for syncope and headaches.  Hematological: Does not bruise/bleed easily.  Psychiatric/Behavioral: Negative for confusion.     Physical Exam Updated Vital Signs BP (!) 166/73 (BP Location: Right Arm)   Pulse 79   Temp 98.6 F (37 C) (Oral)   Resp 18   Ht 1.549 m (5\' 1" )   Wt 62.6 kg   SpO2 97%   BMI 26.07 kg/m   Physical Exam  Constitutional: She is oriented to person, place, and time. She appears well-developed and well-nourished. No distress.  HENT:  Head: Normocephalic and atraumatic.  Mucous membranes dry  Eyes: Pupils are equal, round, and reactive to light. Conjunctivae and EOM are normal.  Neck: Neck supple.  Cardiovascular: Normal rate and regular rhythm.  Pulmonary/Chest: Effort normal and breath sounds normal.  Abdominal: Soft. Bowel sounds are normal. There is no tenderness.  Musculoskeletal: Normal range of motion. She  exhibits no edema.  Neurological: She is alert and oriented to person, place, and time. No cranial nerve deficit or sensory deficit. She exhibits normal muscle tone. Coordination normal.  Skin: Skin is warm. No rash noted.  Nursing note and vitals reviewed.    ED Treatments / Results  Labs (all labs ordered are listed, but only abnormal results are displayed) Labs Reviewed  URINALYSIS, ROUTINE W REFLEX MICROSCOPIC - Abnormal; Notable for the following components:      Result Value   Specific Gravity, Urine <1.005 (*)    Hgb urine dipstick SMALL (*)    Leukocytes, UA SMALL (*)    All other components within normal limits  URINALYSIS, MICROSCOPIC (REFLEX) - Abnormal; Notable for the following components:   Bacteria, UA MANY (*)    All other components within normal limits  COMPREHENSIVE METABOLIC PANEL - Abnormal; Notable for the following components:   Potassium 3.3 (*)    Glucose, Bld 111 (*)    AST 46 (*)    All other components within normal limits  C DIFFICILE QUICK SCREEN W PCR REFLEX  URINE CULTURE  LIPASE, BLOOD  CBC WITH DIFFERENTIAL/PLATELET    EKG None  Radiology Dg Chest 2 View  Result Date: 04/07/2018 CLINICAL DATA:  Acute left-sided chest pain tonight. EXAM: CHEST - 2 VIEW COMPARISON:  12/07/2016 FINDINGS: Lungs are adequately inflated and otherwise clear. Mild stable cardiomegaly. Remainder of the exam is unchanged IMPRESSION: No active cardiopulmonary disease. Electronically Signed   By: Marin Olp M.D.   On: 04/07/2018 20:22   Ct Abdomen Pelvis W Contrast  Result Date: 04/09/2018 CLINICAL DATA:  Diarrhea for 4 days, nausea, abdominal pain, treated with 3 rounds of antibiotics for UTI. EXAM: CT ABDOMEN AND PELVIS WITH CONTRAST TECHNIQUE: Multidetector CT imaging of the abdomen and pelvis was performed using the standard protocol following bolus administration of intravenous contrast. CONTRAST:  133mL ISOVUE-300 IOPAMIDOL (ISOVUE-300) INJECTION 61%  COMPARISON:  CT abdomen dated 09/11/2015. FINDINGS: Lower  chest: No acute abnormality. Hepatobiliary: Scattered benign cysts within the liver. No suspicious mass or lesion. Gallbladder appears normal. No bile duct dilatation. Pancreas: Unremarkable. No pancreatic ductal dilatation or surrounding inflammatory changes. Spleen: Normal in size without focal abnormality. Adrenals/Urinary Tract: Adrenal glands appear normal. LEFT renal cyst. Kidneys otherwise unremarkable without suspicious mass, stone or hydronephrosis. No perinephric inflammation. No ureteral or bladder calculi identified. Bladder is unremarkable, partially decompressed Stomach/Bowel: No dilated large or small bowel loops. Scattered diverticulosis within the descending and sigmoid colon but no focal inflammatory change to suggest acute diverticulitis. No evidence of bowel wall inflammation. Stomach appears normal, partially decompressed. History of appendectomy. Vascular/Lymphatic: Aortic atherosclerosis. No enlarged abdominal or pelvic lymph nodes. Reproductive: Status post hysterectomy. No adnexal masses. Other: No free fluid or abscess collection. No free intraperitoneal air. Musculoskeletal: No acute or suspicious osseous finding. IMPRESSION: 1. No acute findings within the abdomen or pelvis. No bowel obstruction or evidence of bowel wall inflammation. No evidence of pyelonephritis. No renal or ureteral calculi. No free fluid or abscess collection. 2. Colonic diverticulosis without evidence of acute diverticulitis. 3. Additional chronic/incidental findings detailed above. Electronically Signed   By: Franki Cabot M.D.   On: 04/09/2018 12:01    Procedures Procedures (including critical care time)  Medications Ordered in ED Medications  0.9 %  sodium chloride infusion ( Intravenous New Bag/Given 04/09/18 1309)  sodium chloride 0.9 % bolus 500 mL (0 mLs Intravenous Stopped 04/09/18 1306)  ondansetron (ZOFRAN) injection 4 mg (4 mg Intravenous  Given 04/09/18 1222)  iopamidol (ISOVUE-300) 61 % injection 100 mL (100 mLs Intravenous Contrast Given 04/09/18 1138)     Initial Impression / Assessment and Plan / ED Course  I have reviewed the triage vital signs and the nursing notes.  Pertinent labs & imaging results that were available during my care of the patient were reviewed by me and considered in my medical decision making (see chart for details).    Patient's third visit for the same complaint.  Patient seen September 3 and September 13.  On September 3 they thought that she had pyelonephritis based on her symptoms and the fact that the urine had a lot of white blood cells in it.  On on September 13 urine still had a lot of white blood cells in it.  Patient was given another dose of Rocephin and continued on the Keflex.  However cultures from both of these visits showed no evidence of any bacteria growing in the urine.  Patient's had persistent abdominal discomfort fatigue nausea poor appetite and loose bowel movements.  But no true diarrhea no blood in the bowel movement.  Also no vomiting.  Work-up here labs without significant abnormalities.  Urinalysis actually showed less white blood cells.  Did show fair amount of bacteria could be contamination so urine culture sent again.  However recommending patient stop the Keflex at this point in time.  We will have her follow-up with urology for the white blood cells in the urine.  We will treat the nausea with Keflex and the loose bowel movements with Imodium.  We will also have patient follow-up with her primary care doctor.  She will return for any new or worse symptoms.  Patient really may require a colonoscopy upper endoscopy by GI medicine to evaluate this further.  Good news is here today that CT scan of the abdomen had no acute findings.  Do not feel that patient's symptoms were originally related to a urinary tract infection predicted with  a negative cultures.  In addition today shows  less white blood cells but patient really has not improved from her original symptoms.  Labs here today showed some hemoconcentration.  Felt the patient was dehydrated.  Patient did receive IV fluids here did receive Zofran and antinausea medicine.  Patient felt much better.  Patient given referral to urology.  Also referral to [facility] gastroenterology.  And she will call her primary care doctor tomorrow for an appointment.  In addition patient's bowel movements do not seem to be the frequency of concern for C. difficile.  But did attempt to do a C. difficile PRC here today.  Final Clinical Impressions(s) / ED Diagnoses   Final diagnoses:  Cystitis  Diarrhea, unspecified type    ED Discharge Orders         Ordered    ondansetron (ZOFRAN ODT) 4 MG disintegrating tablet  Every 8 hours PRN     04/09/18 1356    loperamide (IMODIUM A-D) 2 MG tablet  4 times daily PRN     04/09/18 1356           Fredia Sorrow, MD 04/09/18 1627

## 2018-04-09 NOTE — ED Triage Notes (Signed)
Nauseated x 3-4 weeks; diarrhea x 4 times since 0700 but started 2 weeks ago.

## 2018-04-10 LAB — URINE CULTURE: Culture: NO GROWTH

## 2018-04-14 DIAGNOSIS — N898 Other specified noninflammatory disorders of vagina: Secondary | ICD-10-CM | POA: Diagnosis not present

## 2018-09-18 DIAGNOSIS — H2513 Age-related nuclear cataract, bilateral: Secondary | ICD-10-CM | POA: Diagnosis not present

## 2018-09-26 DIAGNOSIS — J019 Acute sinusitis, unspecified: Secondary | ICD-10-CM | POA: Diagnosis not present

## 2018-09-26 DIAGNOSIS — K219 Gastro-esophageal reflux disease without esophagitis: Secondary | ICD-10-CM | POA: Diagnosis not present

## 2018-09-26 DIAGNOSIS — R011 Cardiac murmur, unspecified: Secondary | ICD-10-CM | POA: Diagnosis not present

## 2019-02-03 ENCOUNTER — Other Ambulatory Visit: Payer: Self-pay | Admitting: Internal Medicine

## 2019-02-25 ENCOUNTER — Other Ambulatory Visit: Payer: Self-pay | Admitting: Internal Medicine

## 2019-03-07 DIAGNOSIS — Z6825 Body mass index (BMI) 25.0-25.9, adult: Secondary | ICD-10-CM | POA: Diagnosis not present

## 2019-03-07 DIAGNOSIS — N3000 Acute cystitis without hematuria: Secondary | ICD-10-CM | POA: Diagnosis not present

## 2019-03-07 DIAGNOSIS — R3915 Urgency of urination: Secondary | ICD-10-CM | POA: Diagnosis not present

## 2019-04-09 DIAGNOSIS — H2513 Age-related nuclear cataract, bilateral: Secondary | ICD-10-CM | POA: Diagnosis not present

## 2019-04-09 DIAGNOSIS — H524 Presbyopia: Secondary | ICD-10-CM | POA: Diagnosis not present

## 2019-04-16 ENCOUNTER — Ambulatory Visit (HOSPITAL_COMMUNITY)
Admission: RE | Admit: 2019-04-16 | Payer: Medicare Other | Source: Ambulatory Visit | Attending: Internal Medicine | Admitting: Internal Medicine

## 2019-04-17 DIAGNOSIS — E559 Vitamin D deficiency, unspecified: Secondary | ICD-10-CM | POA: Diagnosis not present

## 2019-04-17 DIAGNOSIS — D649 Anemia, unspecified: Secondary | ICD-10-CM | POA: Diagnosis not present

## 2019-04-17 DIAGNOSIS — Z23 Encounter for immunization: Secondary | ICD-10-CM | POA: Diagnosis not present

## 2019-04-17 DIAGNOSIS — E7800 Pure hypercholesterolemia, unspecified: Secondary | ICD-10-CM | POA: Diagnosis not present

## 2019-04-17 DIAGNOSIS — E039 Hypothyroidism, unspecified: Secondary | ICD-10-CM | POA: Diagnosis not present

## 2019-04-17 DIAGNOSIS — I10 Essential (primary) hypertension: Secondary | ICD-10-CM | POA: Diagnosis not present

## 2019-04-17 DIAGNOSIS — E876 Hypokalemia: Secondary | ICD-10-CM | POA: Diagnosis not present

## 2019-04-18 ENCOUNTER — Encounter: Payer: Self-pay | Admitting: *Deleted

## 2019-04-18 ENCOUNTER — Telehealth: Payer: Self-pay | Admitting: *Deleted

## 2019-04-18 NOTE — Telephone Encounter (Signed)
Virtual Visit Pre-Appointment Phone Call  "(Name), I am calling you today to discuss your upcoming appointment. We are currently trying to limit exposure to the virus that causes COVID-19 by seeing patients at home rather than in the office."  1. "What is the BEST phone number to call the day of the visit?" - include this in appointment notes- YES UPDATED  2. "Do you have or have access to (through a family member/friend) a smartphone with video capability that we can use for your visit?"  a. If no - list the appointment type as a PHONE visit in appointment notes-YES UPDATED  3. Confirm consent - "In the setting of the current Covid19 crisis, you are scheduled for a (phone ) visit with DR. ROSS ON 9/24 AT 0920.  Just as we do with many in-office visits, in order for you to participate in this visit, we must obtain consent.  If you'd like, I can send this to your mychart (if signed up) or email for you to review.  Otherwise, I can obtain your verbal consent now.  All virtual visits are billed to your insurance company just like a normal visit would be.  By agreeing to a virtual visit, we'd like you to understand that the technology does not allow for your provider to perform an examination, and thus may limit your provider's ability to fully assess your condition. If your provider identifies any concerns that need to be evaluated in person, we will make arrangements to do so.  Finally, though the technology is pretty good, we cannot assure that it will always work on either your or our end, and in the setting of a video visit, we may have to convert it to a phone-only visit.  In either situation, we cannot ensure that we have a secure connection.  Are you willing to proceed?" STAFF: Did the patient verbally acknowledge consent to telehealth visit? Document YES/NO here: VERBAL CONSENT GIVEN AS WELL AS CONSENT SENT TO [facility] TO REVIEW  4. Advise patient to be prepared - "Two hours prior to your  appointment, go ahead and check your blood pressure, pulse, oxygen saturation, and your weight (if you have the equipment to check those) and write them all down. When your visit starts, your provider will ask you for this information. If you have an Apple Watch or Kardia device, please plan to have heart rate information ready on the day of your appointment. Please have a pen and paper handy nearby the day of the visit as well."-YES PT WILL TAKE HER BP/HR/WT    5. Inform patient they will receive a phone call 15 minutes prior to their appointment time (may be from unknown caller ID) so they should be prepared to [place] has been deemed a candidate for a follow-up tele-health visit to limit community exposure during the Covid-19 pandemic. I spoke with the patient via phone to ensure availability of phone/video source, confirm preferred email & phone number, and discuss instructions and expectations.  I reminded [name] to be prepared with any vital sign and/or heart rhythm information that could potentially be obtained via home monitoring, at the time of her visit. I reminded RICHELLE FAUSTINI to expect a phone call prior to her visit.  Nuala Alpha, LPN [id number]      FULL LENGTH CONSENT FOR TELE-HEALTH VISIT   I hereby voluntarily request, consent and authorize [facility]  and its employed or contracted physicians, Engineer, materials, nurse practitioners or other licensed health care professionals (the Practitioner), to provide me with telemedicine health care services (the "Services") as deemed necessary by the treating Practitioner. I acknowledge and consent to receive the Services by the Practitioner via telemedicine. I understand that the telemedicine visit will involve communicating with the Practitioner through live audiovisual communication technology and the disclosure of certain medical information by electronic  transmission. I acknowledge that I have been given the opportunity to request an in-person assessment or other available alternative prior to the telemedicine visit and am voluntarily participating in the telemedicine visit.  I understand that I have the right to withhold or withdraw my consent to the use of telemedicine in the course of my care at any time, without affecting my right to future care or treatment, and that the Practitioner or I may terminate the telemedicine visit at any time. I understand that I have the right to inspect all information obtained and/or recorded in the course of the telemedicine visit and may receive copies of available information for a reasonable fee.  I understand that some of the potential risks of receiving the Services via telemedicine include:  Marland Kitchen Delay or interruption in medical evaluation due to technological equipment failure or disruption; . Information transmitted may not be sufficient (e.g. poor resolution of images) to allow for appropriate medical decision making by the Practitioner; and/or  . In rare instances, security protocols could fail, causing a breach of personal health information.  Furthermore, I acknowledge that it is my responsibility to provide information about my medical history, conditions and care that is complete and accurate to the best of my ability. I acknowledge that Practitioner's advice, recommendations, and/or decision may be based on factors not within their control, such as incomplete or inaccurate data provided by me or distortions of diagnostic images or specimens that may result from electronic transmissions. I understand that the practice of medicine is not an exact science and that Practitioner makes no warranties or guarantees regarding treatment outcomes. I acknowledge that I will receive a copy of this consent concurrently upon execution via email to the email address I last provided but may also request a printed copy by  calling the office of [facility].    I understand that my insurance will be billed for this visit.   I have read or had this consent read to me. . I understand the contents of this consent, which adequately explains the benefits and risks of the Services being provided via telemedicine.  . I have been provided ample opportunity to ask questions regarding this consent and the Services and have had my questions answered to my satisfaction. . I give my informed consent for the services to be provided through the use of telemedicine in my medical care  By participating in this telemedicine visit I agree to the above. YES PT GAVE VERBAL CONSENT AS WELL AS CONSENT SENT TO MYCHART TO REVIEW FOR DR ROSS TO TREAT HER VIA TELEPHONE VISIT ON 04/19/19.

## 2019-04-19 ENCOUNTER — Other Ambulatory Visit: Payer: Self-pay

## 2019-04-19 ENCOUNTER — Telehealth (INDEPENDENT_AMBULATORY_CARE_PROVIDER_SITE_OTHER): Payer: Medicare Other | Admitting: Internal Medicine

## 2019-04-19 DIAGNOSIS — I6523 Occlusion and stenosis of bilateral carotid arteries: Secondary | ICD-10-CM | POA: Diagnosis not present

## 2019-04-19 DIAGNOSIS — I2510 Atherosclerotic heart disease of native coronary artery without angina pectoris: Secondary | ICD-10-CM

## 2019-04-19 DIAGNOSIS — E782 Mixed hyperlipidemia: Secondary | ICD-10-CM

## 2019-04-19 DIAGNOSIS — I10 Essential (primary) hypertension: Secondary | ICD-10-CM | POA: Diagnosis not present

## 2019-04-19 MED ORDER — METOPROLOL TARTRATE 25 MG PO TABS: 12.5000 mg | ORAL_TABLET | Freq: Two times a day (BID) | ORAL | 3 refills | Status: DC

## 2019-04-19 MED ORDER — NITROGLYCERIN 0.4 MG SL SUBL: SUBLINGUAL_TABLET | SUBLINGUAL | 6 refills | Status: DC

## 2019-04-19 MED ORDER — ROSUVASTATIN CALCIUM 20 MG PO TABS: 20.0000 mg | ORAL_TABLET | Freq: Every day | ORAL | 3 refills | Status: DC

## 2019-04-19 MED ORDER — VALSARTAN-HYDROCHLOROTHIAZIDE 160-12.5 MG PO TABS: 1.0000 | ORAL_TABLET | Freq: Every day | ORAL | 3 refills | Status: DC

## 2019-04-19 NOTE — Progress Notes (Signed)
Virtual Visit via Telephone Note   This visit type was conducted due to national recommendations for restrictions regarding the COVID-19 Pandemic (e.g. social distancing) in an effort to limit this patient's exposure and mitigate transmission in our community.  Due to her co-morbid illnesses, this patient is at least at moderate risk for complications without adequate follow up.  This format is felt to be most appropriate for this patient at this time.  The patient did not have access to video technology/had technical difficulties with video requiring transitioning to audio format only (telephone).  All issues noted in this document were discussed and addressed.  No physical exam could be performed with this format.  Please refer to the patient's chart for her  consent to telehealth for [facility].   Date:  04/19/2019   ID:  [name], DOB 07-12-1946, MRN [id number]  Patient Location: Home Provider Location: Home  PCP:  Lawerance Cruel, MD  Cardiologist:  No primary care provider on file.  Electrophysiologist:  None   Evaluation Performed:  Follow-Up Visit  Chief Complaint:    History of Present Illness:    [name] is a 73 y.o. female with a history of HTN and CAD   Last cath done in 2015 for CP  It showd patent stent to RCA  50% posterior AVsegment  Minimal dz in LAD LCx (nondominant) with long patent stent.  10 to 20% instent restenosis.   LVEF normal.  The pt also has mild to moderate CV dz   I saw her in July 2019   She says she has been doing good from a cardiac standpoint   Denies CP  Breathing is OK  No dizziness   No palpitations    Has not been getting out much because of COVID    The patient does not have symptoms concerning for COVID-19 infection (fever, chills, cough, or new shortness of breath).    Past Medical History:  Diagnosis Date  . Anginal pain ([place])   . Carotid artery disease ([place])    a. Carotid US 9/17: R 1-39%; L 40-59% >> FU 1 year  .  Coronary atherosclerosis of native coronary artery    a. DES to LCx in 07/2010 b. DES to RCA in 04/2013  . Exertional shortness of breath   . Heart murmur   . Hiatal hernia   . History of blood transfusion    "after daughter was born" (05/22/2013)  . HLD (hyperlipidemia)   . HTN (hypertension)   . Hypothyroidism   . Osteoporosis   . Palpitations   . Premature ventricular contractions   . Recurrent UTI    "q 4 months for the last couple years; last one was 12/2012" (05/22/2013)  . Sinus headache   . Tremors of nervous system    by dr. love   Past Surgical History:  Procedure Laterality Date  . APPENDECTOMY    . BREAST CYST ASPIRATION Bilateral    "numerous in the dr's office" (05/22/2013)  . BREAST CYST EXCISION Right 1965  . BREAST LUMPECTOMY Right 1980's   "benign" (05/22/2013)  . CARDIAC CATHETERIZATION  ~ 1968; 08/2010  . CORONARY ANGIOPLASTY WITH STENT PLACEMENT  07/2010  . CORONARY ANGIOPLASTY WITH STENT PLACEMENT  05/23/2013   Patent LCx stent (10-20% ISR), 70% mid RCA (FFR 0.73) s/p DES, 20% prox RCA, 40% ostial D1, 60% distal posterior AV branch; EF 65-70%  . ECTOPIC PREGNANCY SURGERY    . LAPAROTOMY  1965   "  ruptured blood vessel in my side during childbirth; didn't find it til 2 days later; had to open me up to repair it" (05/22/2013)  . LEFT HEART CATHETERIZATION WITH CORONARY ANGIOGRAM N/A 05/23/2013   Procedure: LEFT HEART CATHETERIZATION WITH CORONARY ANGIOGRAM;  Surgeon: Wellington Hampshire, MD;  Location: [place] CATH LAB;  Service: Cardiovascular;  Laterality: N/A;  . LEFT HEART CATHETERIZATION WITH CORONARY ANGIOGRAM N/A 01/28/2014   Procedure: LEFT HEART CATHETERIZATION WITH CORONARY ANGIOGRAM;  Surgeon: Blane Ohara, MD;  Location: [facility] CATH LAB;  Service: Cardiovascular;  Laterality: N/A;  . TONSILLECTOMY    . TOTAL ABDOMINAL HYSTERECTOMY       Current Meds  Medication Sig  . aspirin (ASPIR-81) 81 MG EC tablet Take 81 mg by mouth daily.    . Calcium Carbonate  (CALTRATE 600) 1500 MG TABS Take 600 mg of elemental calcium by mouth 2 (two) times daily.   Marland Kitchen esomeprazole (NEXIUM) 40 MG capsule Take 40 mg by mouth daily.    Marland Kitchen levothyroxine (SYNTHROID, LEVOTHROID) 50 MCG tablet Take 50 mcg by mouth daily before breakfast.  . metoprolol tartrate (LOPRESSOR) 25 MG tablet TAKE 1/2 TABLET BY MOUTH TWICE DAILY  . Multiple Vitamins-Minerals (MULTIVITAL) tablet Take 1 tablet by mouth daily.    . rosuvastatin (CRESTOR) 20 MG tablet Take 1 tablet (20 mg total) by mouth at bedtime. Please schedule appt for future refills. 1st attempt  . valsartan-hydrochlorothiazide (DIOVAN HCT) 160-12.5 MG tablet Take 1 tablet by mouth daily.     Allergies:   Atorvastatin, Codeine, Flagyl [metronidazole hcl], Metronidazole, Oseltamivir phosphate, Prednisone, Simvastatin, Sulfa antibiotics, Tamiflu [oseltamivir], and Penicillins   Social History   Tobacco Use  . Smoking status: Never Smoker  . Smokeless tobacco: Never Used  Substance Use Topics  . Alcohol use: No  . Drug use: No     Family Hx: The patient's family history includes CAD in her mother; Heart attack (age of onset: 70) in her brother; Heart disease in her father; Lymphoma in her mother; Stroke in an other family member.  ROS:   Please see the history of present illness.    All other systems reviewed and are negative.   Prior CV studies:   The following studies were reviewed today: Carotid USN  Final Interpretation: Right Carotid: Velocities in the right ICA are consistent with a 1-39% stenosis.  Left Carotid: Velocities in the left ICA are consistent with a 40-59% stenosis.  Vertebrals:  Right vertebral artery demonstrates antegrade flow. Left vertebral              artery demonstrates high resistant flow. Subclavians: Normal flow hemodynamics were seen in bilateral subclavian              arteries.  *See table(s) above for measurements and observations. Suggest follow up study in 12  months.   Electronically signed by Jenkins Rouge MD on 02/10/2018 at 12:37:47 PM. Labs/Other Tests and Data Reviewed:    EKG:  No ECG reviewed.  Recent Labs: No results found for requested labs within last 8760 hours.   Recent Lipid Panel Lab Results  Component Value Date/Time   CHOL 164 10/19/2016 03:53 AM   TRIG 260 (H) 10/19/2016 03:53 AM   HDL 46 10/19/2016 03:53 AM   CHOLHDL 3.6 10/19/2016 03:53 AM   LDLCALC 66 10/19/2016 03:53 AM   LDLDIRECT 121.5 09/28/2010 09:14 AM    Wt Readings from Last 3 Encounters:  04/19/19 133 lb (60.3 kg)  04/09/18 138 lb (62.6 kg)  04/07/18 137 lb 12.6 oz (62.5 kg)     Objective:    Vital Signs:  BP 138/86   Pulse 72   Temp 97.7 F (36.5 C)   Wt 133 lb (60.3 kg)   BMI 25.13 kg/m    VITAL SIGNS:  reviewed  ASSESSMENT & PLAN:    1. CAD   No symptoms of angiina  Continue on current regmieb   2  CV dz   Stable USN   Follow up in 1 year  3  HL   Will need to get labs from [employer]     4  HTN  BP is fair  Continue to follow  Encouraged pt to get out more to walk  5  COVID-19 Education: The signs and symptoms of COVID-19 were discussed with the patient and how to seek care for testing (follow up with PCP or arrange E-visit).  The importance of social distancing was discussed today.  Time:   Today, I have spent  minutes with the patient with telehealth technology discussing the above problems.     Medication Adjustments/Labs and Tests Ordered: Current medicines are reviewed at length with the patient today.  Concerns regarding medicines are outlined above.   Tests Ordered: No orders of the defined types were placed in this encounter.   Medication Changes: No orders of the defined types were placed in this encounter.   Follow Up:  In Person August 2021  Signed, Dorris Carnes, MD  04/19/2019 9:42 AM    [facility]

## 2019-04-19 NOTE — Addendum Note (Signed)
Addended by: Rodman Key on: 04/19/2019 11:21 AM   Modules accepted: Orders

## 2019-04-19 NOTE — Patient Instructions (Addendum)
Medication Instructions:  No changes If you need a refill on your cardiac medications before your next appointment, please call your pharmacy.   Lab work: We have requested a copy of your recent labs from your primary care physician.  Testing/Procedures: none  Follow-Up: At [facility], you and your health needs are our priority.  As part of our continuing mission to provide you with exceptional heart care, we have created designated Provider Care Teams.  These Care Teams include your primary Cardiologist (physician) and Advanced Practice Providers (APPs -  Physician Assistants and Nurse Practitioners) who all work together to provide you with the care you need, when you need it. You will need a follow up appointment in:--  12 months.  Please call our office 2 months in advance to schedule this appointment.  You may see Dr. Dorris Carnes or one of the following Advanced Practice Providers on your designated Care Team: Richardson Dopp, PA-C [place], [place] . Daune Perch, NP  Any Other Special Instructions Will Be Listed Below (If Applicable).

## 2019-04-23 DIAGNOSIS — D649 Anemia, unspecified: Secondary | ICD-10-CM | POA: Diagnosis not present

## 2019-04-23 DIAGNOSIS — E559 Vitamin D deficiency, unspecified: Secondary | ICD-10-CM | POA: Diagnosis not present

## 2019-04-23 DIAGNOSIS — J309 Allergic rhinitis, unspecified: Secondary | ICD-10-CM | POA: Diagnosis not present

## 2019-04-23 DIAGNOSIS — I209 Angina pectoris, unspecified: Secondary | ICD-10-CM | POA: Diagnosis not present

## 2019-04-23 DIAGNOSIS — K219 Gastro-esophageal reflux disease without esophagitis: Secondary | ICD-10-CM | POA: Diagnosis not present

## 2019-04-23 DIAGNOSIS — M810 Age-related osteoporosis without current pathological fracture: Secondary | ICD-10-CM | POA: Diagnosis not present

## 2019-04-23 DIAGNOSIS — Z0000 Encounter for general adult medical examination without abnormal findings: Secondary | ICD-10-CM | POA: Diagnosis not present

## 2019-04-23 DIAGNOSIS — E039 Hypothyroidism, unspecified: Secondary | ICD-10-CM | POA: Diagnosis not present

## 2019-04-23 DIAGNOSIS — E7800 Pure hypercholesterolemia, unspecified: Secondary | ICD-10-CM | POA: Diagnosis not present

## 2019-04-23 DIAGNOSIS — I779 Disorder of arteries and arterioles, unspecified: Secondary | ICD-10-CM | POA: Diagnosis not present

## 2019-04-23 DIAGNOSIS — I10 Essential (primary) hypertension: Secondary | ICD-10-CM | POA: Diagnosis not present

## 2019-05-16 DIAGNOSIS — Z20828 Contact with and (suspected) exposure to other viral communicable diseases: Secondary | ICD-10-CM | POA: Diagnosis not present

## 2019-05-27 ENCOUNTER — Other Ambulatory Visit: Payer: Self-pay | Admitting: Internal Medicine

## 2019-06-20 ENCOUNTER — Other Ambulatory Visit (HOSPITAL_COMMUNITY): Payer: Self-pay | Admitting: Internal Medicine

## 2019-06-20 DIAGNOSIS — I6523 Occlusion and stenosis of bilateral carotid arteries: Secondary | ICD-10-CM

## 2019-07-02 ENCOUNTER — Encounter (HOSPITAL_COMMUNITY): Payer: Medicare Other

## 2019-08-08 DIAGNOSIS — N3000 Acute cystitis without hematuria: Secondary | ICD-10-CM | POA: Diagnosis not present

## 2019-08-16 IMAGING — CR DG CHEST 2V
2 series · 2 of 2 positions shown · non-contrast
Comparison: 12/07/2016

CLINICAL DATA: Acute left-sided chest pain tonight.

EXAM:
CHEST - 2 VIEW

[chest lat]
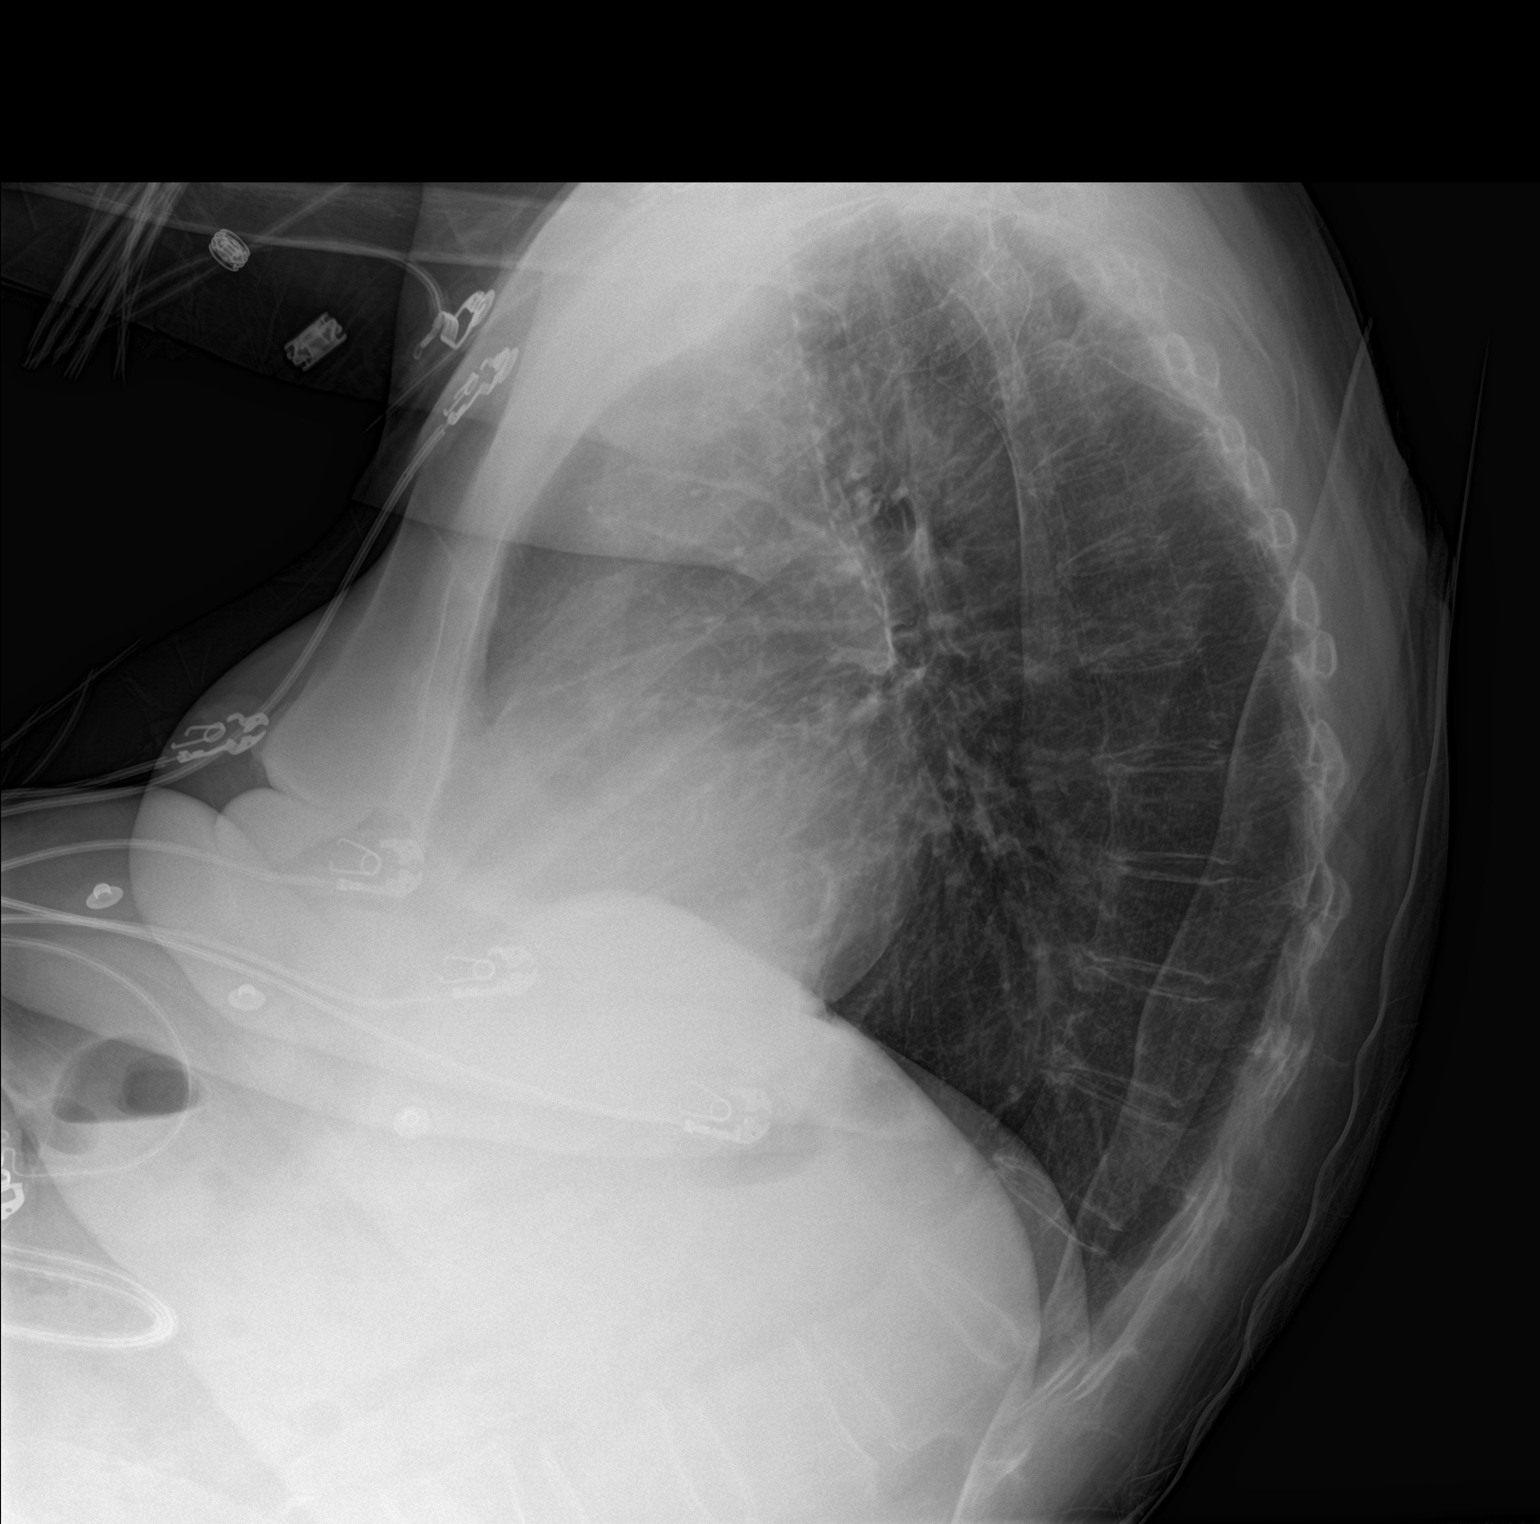

[chest ap]
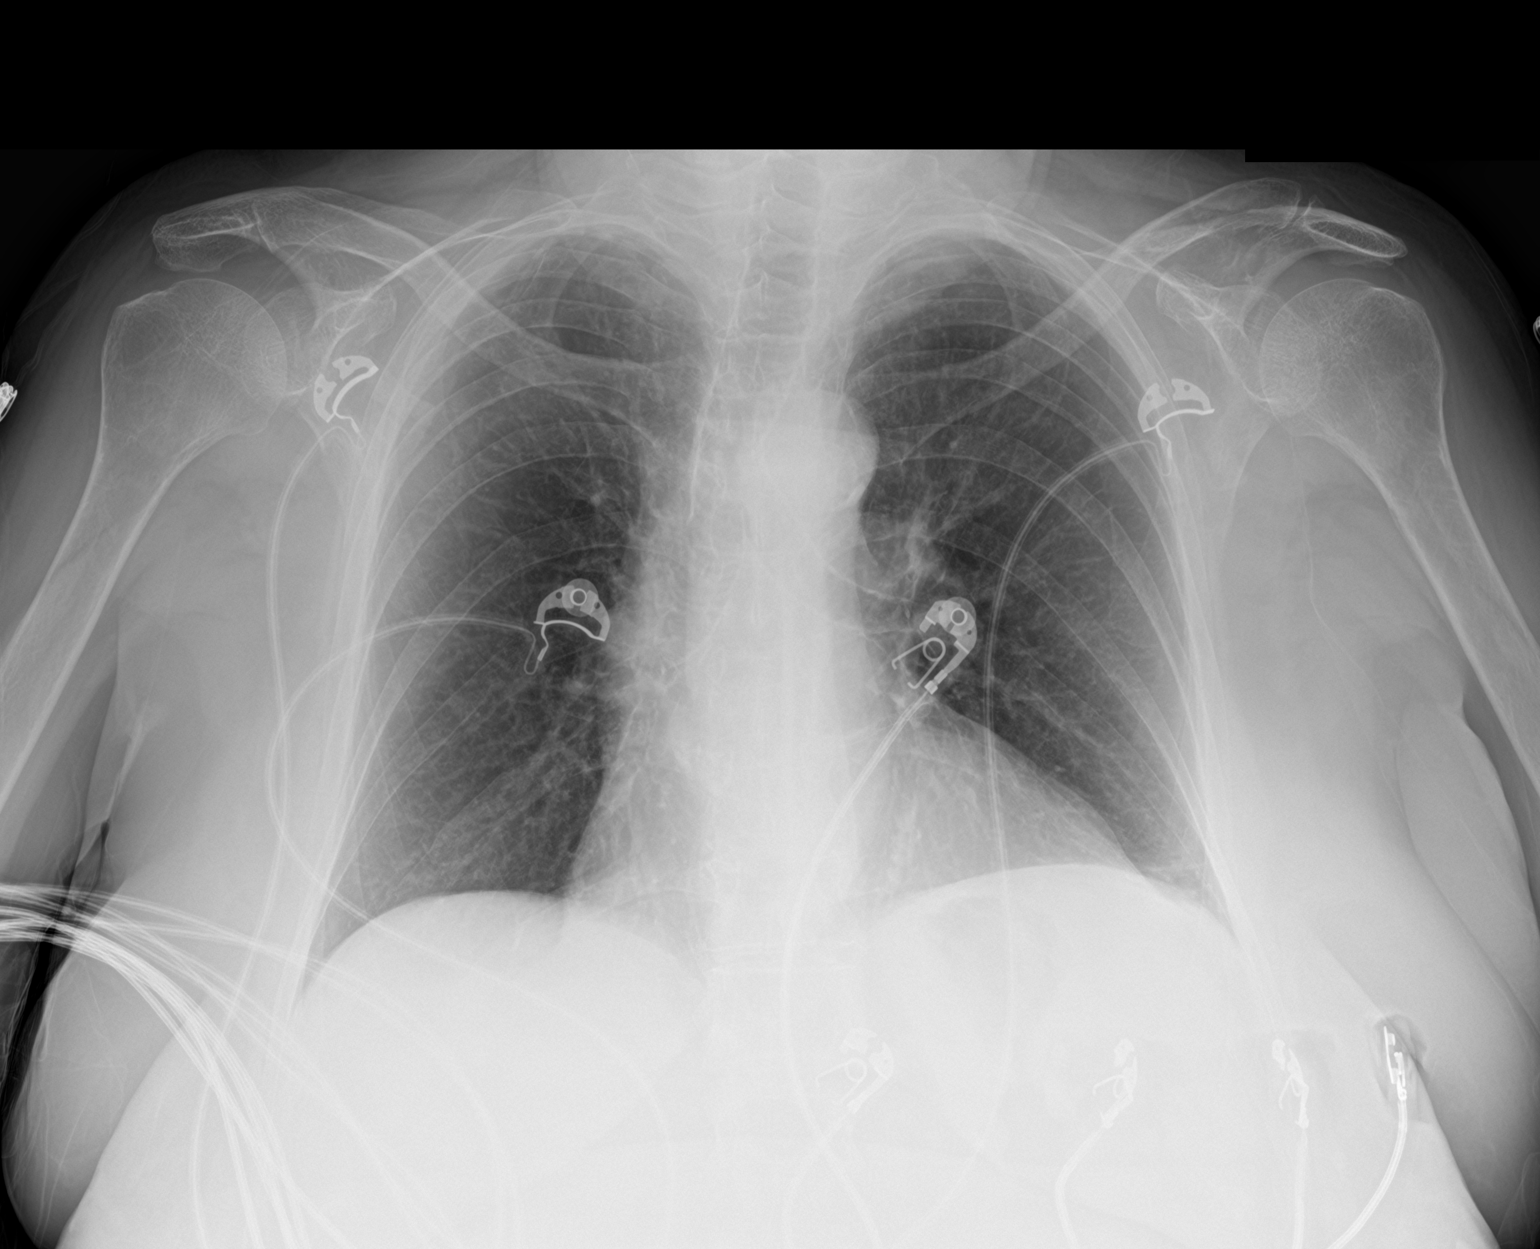

[2 of 2 positions shown; findings below may reference images not displayed]

FINDINGS: Lungs are adequately inflated and otherwise clear. Mild stable
cardiomegaly. Remainder of the exam is unchanged
IMPRESSION: No active cardiopulmonary disease.

## 2019-08-17 DIAGNOSIS — H2513 Age-related nuclear cataract, bilateral: Secondary | ICD-10-CM | POA: Diagnosis not present

## 2019-08-29 DIAGNOSIS — H25812 Combined forms of age-related cataract, left eye: Secondary | ICD-10-CM | POA: Diagnosis not present

## 2019-08-29 DIAGNOSIS — H2512 Age-related nuclear cataract, left eye: Secondary | ICD-10-CM | POA: Diagnosis not present

## 2019-08-30 ENCOUNTER — Encounter (HOSPITAL_COMMUNITY): Payer: Self-pay | Admitting: Emergency Medicine

## 2019-08-30 ENCOUNTER — Observation Stay (HOSPITAL_COMMUNITY)
Admission: EM | Admit: 2019-08-30 | Discharge: 2019-08-31 | Disposition: A | Discharge status: Home Health | Payer: Medicare Other | Attending: Internal Medicine | Admitting: Internal Medicine

## 2019-08-30 ENCOUNTER — Other Ambulatory Visit: Payer: Self-pay

## 2019-08-30 ENCOUNTER — Emergency Department (HOSPITAL_COMMUNITY): Payer: Medicare Other

## 2019-08-30 DIAGNOSIS — R531 Weakness: Secondary | ICD-10-CM | POA: Insufficient documentation

## 2019-08-30 DIAGNOSIS — Z955 Presence of coronary angioplasty implant and graft: Secondary | ICD-10-CM

## 2019-08-30 DIAGNOSIS — E039 Hypothyroidism, unspecified: Secondary | ICD-10-CM | POA: Diagnosis present

## 2019-08-30 DIAGNOSIS — Z79899 Other long term (current) drug therapy: Secondary | ICD-10-CM | POA: Diagnosis not present

## 2019-08-30 DIAGNOSIS — R55 Syncope and collapse: Secondary | ICD-10-CM | POA: Diagnosis not present

## 2019-08-30 DIAGNOSIS — R42 Dizziness and giddiness: Secondary | ICD-10-CM | POA: Diagnosis not present

## 2019-08-30 DIAGNOSIS — Z7982 Long term (current) use of aspirin: Secondary | ICD-10-CM | POA: Diagnosis not present

## 2019-08-30 DIAGNOSIS — R079 Chest pain, unspecified: Secondary | ICD-10-CM | POA: Diagnosis not present

## 2019-08-30 DIAGNOSIS — I2510 Atherosclerotic heart disease of native coronary artery without angina pectoris: Secondary | ICD-10-CM | POA: Diagnosis not present

## 2019-08-30 DIAGNOSIS — E785 Hyperlipidemia, unspecified: Secondary | ICD-10-CM | POA: Diagnosis not present

## 2019-08-30 DIAGNOSIS — I493 Ventricular premature depolarization: Secondary | ICD-10-CM | POA: Insufficient documentation

## 2019-08-30 DIAGNOSIS — Z20822 Contact with and (suspected) exposure to covid-19: Secondary | ICD-10-CM | POA: Diagnosis not present

## 2019-08-30 DIAGNOSIS — Z79890 Hormone replacement therapy (postmenopausal): Secondary | ICD-10-CM | POA: Diagnosis not present

## 2019-08-30 DIAGNOSIS — R072 Precordial pain: Secondary | ICD-10-CM | POA: Diagnosis not present

## 2019-08-30 DIAGNOSIS — I10 Essential (primary) hypertension: Secondary | ICD-10-CM | POA: Diagnosis present

## 2019-08-30 DIAGNOSIS — R0789 Other chest pain: Secondary | ICD-10-CM | POA: Diagnosis not present

## 2019-08-30 LAB — URINALYSIS, ROUTINE W REFLEX MICROSCOPIC
Bacteria, UA: NONE SEEN
Bilirubin Urine: NEGATIVE
Glucose, UA: NEGATIVE mg/dL
Ketones, ur: NEGATIVE mg/dL
Nitrite: NEGATIVE
Protein, ur: NEGATIVE mg/dL
Specific Gravity, Urine: 1.003 — ABNORMAL LOW (ref 1.005–1.030)
pH: 6 (ref 5.0–8.0)

## 2019-08-30 LAB — CBC
HCT: 35.7 % — ABNORMAL LOW (ref 36.0–46.0)
Hemoglobin: 10.8 g/dL — ABNORMAL LOW (ref 12.0–15.0)
MCH: 26 pg (ref 26.0–34.0)
MCHC: 30.3 g/dL (ref 30.0–36.0)
MCV: 86 fL (ref 80.0–100.0)
Platelets: 266 10*3/uL (ref 150–400)
RBC: 4.15 MIL/uL (ref 3.87–5.11)
RDW: 13.9 % (ref 11.5–15.5)
WBC: 7.1 10*3/uL (ref 4.0–10.5)
nRBC: 0 % (ref 0.0–0.2)

## 2019-08-30 LAB — BASIC METABOLIC PANEL
Anion gap: 9 (ref 5–15)
BUN: 14 mg/dL (ref 8–23)
CO2: 27 mmol/L (ref 22–32)
Calcium: 9.1 mg/dL (ref 8.9–10.3)
Chloride: 101 mmol/L (ref 98–111)
Creatinine, Ser: 0.8 mg/dL (ref 0.44–1.00)
GFR calc Af Amer: >60 mL/min (ref >60)
GFR calc non Af Amer: >60 mL/min (ref >60)
Glucose, Bld: 115 mg/dL — ABNORMAL HIGH (ref 70–99)
Potassium: 3.4 mmol/L — ABNORMAL LOW (ref 3.5–5.1)
Sodium: 137 mmol/L (ref 135–145)

## 2019-08-30 LAB — TROPONIN I (HIGH SENSITIVITY)
Troponin I (High Sensitivity): 3 ng/L (ref <18)
Troponin I (High Sensitivity): 3 ng/L (ref <18)

## 2019-08-30 MED ORDER — ONDANSETRON HCL 4 MG/2ML IJ SOLN: 4.0000 mg | Freq: Four times a day (QID) | INTRAMUSCULAR | Status: DC | PRN

## 2019-08-30 MED ORDER — ONDANSETRON HCL 4 MG PO TABS: 4.0000 mg | ORAL_TABLET | Freq: Four times a day (QID) | ORAL | Status: DC | PRN

## 2019-08-30 MED ORDER — METOPROLOL TARTRATE 12.5 MG HALF TABLET
12.5000 mg | ORAL_TABLET | Freq: Two times a day (BID) | ORAL | Status: DC
  Administered 2019-08-30 – 2019-08-31 (×2): 12.5 mg via ORAL
  Filled 2019-08-30 (×2): qty 1

## 2019-08-30 MED ORDER — ASPIRIN EC 81 MG PO TBEC: 81.0000 mg | DELAYED_RELEASE_TABLET | Freq: Every day | ORAL | Status: DC

## 2019-08-30 MED ORDER — PANTOPRAZOLE SODIUM 40 MG PO TBEC
40.0000 mg | DELAYED_RELEASE_TABLET | Freq: Every day | ORAL | Status: DC
  Administered 2019-08-31: 40 mg via ORAL
  Filled 2019-08-30: qty 1

## 2019-08-30 MED ORDER — SODIUM CHLORIDE 0.9% FLUSH: 3.0000 mL | Freq: Once | INTRAVENOUS | Status: DC

## 2019-08-30 MED ORDER — NITROGLYCERIN 0.4 MG SL SUBL: 0.4000 mg | SUBLINGUAL_TABLET | SUBLINGUAL | Status: DC | PRN

## 2019-08-30 MED ORDER — LEVOTHYROXINE SODIUM 50 MCG PO TABS
50.0000 ug | ORAL_TABLET | Freq: Every day | ORAL | Status: DC
  Administered 2019-08-31: 50 ug via ORAL
  Filled 2019-08-30: qty 1

## 2019-08-30 MED ORDER — LOTEPREDNOL ETABONATE 0.5 % OP SUSP
1.0000 [drp] | Freq: Four times a day (QID) | OPHTHALMIC | Status: DC
  Filled 2019-08-30: qty 5

## 2019-08-30 MED ORDER — VALSARTAN-HYDROCHLOROTHIAZIDE 160-12.5 MG PO TABS: 1.0000 | ORAL_TABLET | Freq: Every day | ORAL | Status: DC

## 2019-08-30 MED ORDER — ACETAMINOPHEN 650 MG RE SUPP: 650.0000 mg | Freq: Four times a day (QID) | RECTAL | Status: DC | PRN

## 2019-08-30 MED ORDER — ACETAMINOPHEN 325 MG PO TABS
650.0000 mg | ORAL_TABLET | Freq: Four times a day (QID) | ORAL | Status: DC | PRN
  Administered 2019-08-31: 12:00:00 650 mg via ORAL
  Filled 2019-08-30: qty 2

## 2019-08-30 MED ORDER — ROSUVASTATIN CALCIUM 20 MG PO TABS
20.0000 mg | ORAL_TABLET | Freq: Every day | ORAL | Status: DC
  Administered 2019-08-30: 20 mg via ORAL
  Filled 2019-08-30: qty 1

## 2019-08-30 MED ORDER — GATIFLOXACIN 0.5 % OP SOLN
1.0000 [drp] | Freq: Four times a day (QID) | OPHTHALMIC | Status: DC
  Administered 2019-08-31 (×2): 1 [drp] via OPHTHALMIC
  Filled 2019-08-30: qty 2.5

## 2019-08-30 NOTE — ED Notes (Signed)
Pt ambulatory to and from BR with strong, steady gait. Pt appearing anxious and reported to tech that she felt weak while walking to bathroom. Urine collected and sent to lab by tech

## 2019-08-30 NOTE — ED Notes (Signed)
Pt wheeled to ED Rm27 from [place]. Pt is A&Ox4, in NAD. VSS on continuous monitors. Pt breathing easy, non-labored. Equal rise and fall of chest noted. Speaking in full sentences. Pt reports she had cataract surgery yesterday. Pt states she woke up this morning feeling very weak and lightheaded. Pt also c/o midsternalm, non-radiating chest pressure that developed around 4PM. Pt was give 1 nitro and 4 baby aspirins by EMS en route with some relief. Pt reporting chest pressure is worsening again presently. Pt has hx of stents and angina.

## 2019-08-30 NOTE — ED Notes (Signed)
While doing orthostatic vital signs, pt stated that she was feeling "so week" and "might pass out." EDP, Pfeiffer, notified.

## 2019-08-30 NOTE — H&P (Signed)
History and Physical    [name] [id number] DOB: 02-24-1946 DOA: 08/30/2019  PCP: Lawerance Cruel, MD  Patient coming from: Home.  Chief Complaint: Chest pain and weakness.  HPI: [name] is a 74 y.o. female with history of CAD status post stenting last 1 in 2015, hypertension, hyperlipidemia who has had a left eye cataract surgery on August 29, 2019.  Following which patient came home and started feeling weak weakness persisted and started feeling dizzy and having palpitation whenever she walks.  Last evening around 5 PM patient started also developing chest pressure retrosternal at this point patient called EMS.  EMS arrived and gave aspirin and nitroglycerin chest pain resolved.  Patient was brought to the ER.  ED Course: In the ER EKG was showing normal sinus rhythm chest x-ray unremarkable cardiac markers were negative.  Covid test was negative.  Patient on trying to ambulate was again feeling dizzy with palpitation.  Patient admitted for further management.  Labs show potassium 3.4 hemoglobin 10.8 which is a drop of around 2 g from 2019.  Patient admitted for further management of chest pain and near syncopal episodes.  Review of Systems: As per HPI, rest all negative.   Past Medical History:  Diagnosis Date  . Anginal pain ([place])   . Carotid artery disease ([place])    a. Carotid US 9/17: R 1-39%; L 40-59% >> FU 1 year  . Coronary atherosclerosis of native coronary artery    a. DES to LCx in 07/2010 b. DES to RCA in 04/2013  . Exertional shortness of breath   . Heart murmur   . Hiatal hernia   . History of blood transfusion    "after daughter was born" (05/22/2013)  . HLD (hyperlipidemia)   . HTN (hypertension)   . Hypothyroidism   . Osteoporosis   . Palpitations   . Premature ventricular contractions   . Recurrent UTI    "q 4 months for the last couple years; last one was 12/2012" (05/22/2013)  . Sinus headache   . Tremors of nervous system    by dr.  love    Past Surgical History:  Procedure Laterality Date  . APPENDECTOMY    . BREAST CYST ASPIRATION Bilateral    "numerous in the dr's office" (05/22/2013)  . BREAST CYST EXCISION Right 1965  . BREAST LUMPECTOMY Right 1980's   "benign" (05/22/2013)  . CARDIAC CATHETERIZATION  ~ 1968; 08/2010  . CORONARY ANGIOPLASTY WITH STENT PLACEMENT  07/2010  . CORONARY ANGIOPLASTY WITH STENT PLACEMENT  05/23/2013   Patent LCx stent (10-20% ISR), 70% mid RCA (FFR 0.73) s/p DES, 20% prox RCA, 40% ostial D1, 60% distal posterior AV branch; EF 65-70%  . ECTOPIC PREGNANCY SURGERY    . LAPAROTOMY  1965   "ruptured blood vessel in my side during childbirth; didn't find it til 2 days later; had to open me up to repair it" (05/22/2013)  . LEFT HEART CATHETERIZATION WITH CORONARY ANGIOGRAM N/A 05/23/2013   Procedure: LEFT HEART CATHETERIZATION WITH CORONARY ANGIOGRAM;  Surgeon: Wellington Hampshire, MD;  Location: [place] CATH LAB;  Service: Cardiovascular;  Laterality: N/A;  . LEFT HEART CATHETERIZATION WITH CORONARY ANGIOGRAM N/A 01/28/2014   Procedure: LEFT HEART CATHETERIZATION WITH CORONARY ANGIOGRAM;  Surgeon: Blane Ohara, MD;  Location: [facility] CATH LAB;  Service: Cardiovascular;  Laterality: N/A;  . TONSILLECTOMY    . TOTAL ABDOMINAL HYSTERECTOMY       reports that she has never smoked. She has never used  smokeless tobacco. She reports that she does not drink alcohol or use drugs.  Allergies  Allergen Reactions  . Atorvastatin     REACTION: myaligia  . Codeine Hives and Rash  . Flagyl [Metronidazole Hcl] Anaphylaxis  . Metronidazole Nausea And Vomiting, Rash and Other (See Comments)    Chest pain   . Oseltamivir Phosphate Itching  . Prednisone Palpitations    Makes my heart race  . Simvastatin Other (See Comments)    REACTION: myalgia  . Sulfa Antibiotics Other (See Comments) and Rash    Jittery  . Tamiflu [Oseltamivir] Itching  . Penicillins Nausea And Vomiting and Rash    Did it involve  swelling of the face/tongue/throat, SOB, or low BP? No Did it involve sudden or severe rash/hives, skin peeling, or any reaction on the inside of your mouth or nose? Yes Did you need to seek medical attention at a hospital or doctor's office? Yes When did it last happen?74 years old  If all above answers are "NO", may proceed with cephalosporin use.    Family History  Problem Relation Age of Onset  . CAD Mother   . Lymphoma Mother   . Heart disease Father   . Heart attack Brother 9  . Stroke Other     Prior to Admission medications   Medication Sig Start Date End Date Taking? Authorizing Provider  aspirin (ASPIR-81) 81 MG EC tablet Take 81 mg by mouth daily.     Yes [provider]  Calcium Carbonate (CALTRATE 600) 1500 MG TABS Take 600 mg of elemental calcium by mouth 2 (two) times daily.    Yes [provider]  esomeprazole (NEXIUM) 40 MG capsule Take 40 mg by mouth daily.     Yes [provider]  levothyroxine (SYNTHROID, LEVOTHROID) 50 MCG tablet Take 50 mcg by mouth daily before breakfast.   Yes [provider]  loteprednol (LOTEMAX) 0.5 % ophthalmic suspension Place 1 drop into the left eye in the morning, at noon, in the evening, and at bedtime.   Yes [provider]  metoprolol tartrate (LOPRESSOR) 25 MG tablet Take 0.5 tablets (12.5 mg total) by mouth 2 (two) times daily. 04/19/19  Yes Fay Records, MD  moxifloxacin (VIGAMOX) 0.5 % ophthalmic solution Place 1 drop into the left eye in the morning, at noon, in the evening, and at bedtime.   Yes [provider]  Multiple Vitamins-Minerals (MULTIVITAL) tablet Take 1 tablet by mouth daily.     Yes [provider]  nitroGLYCERIN (NITROSTAT) 0.4 MG SL tablet DISSOLVE ONE TABLET UNDER TONGUE AS NEEDED FOR CHEST PAIN EVERY 5 MINUTES FOR 3 DOSES Patient taking differently: Place 0.4 mg under the tongue every 5 (five) minutes as needed for chest pain.  04/19/19  Yes Fay Records, MD  rosuvastatin (CRESTOR) 20 MG tablet Take 1 tablet (20 mg total) by mouth at bedtime. 04/19/19  Yes Fay Records, MD  valsartan-hydrochlorothiazide (DIOVAN HCT) 160-12.5 MG tablet Take 1 tablet by mouth daily. 04/19/19  Yes Fay Records, MD  ondansetron (ZOFRAN ODT) 4 MG disintegrating tablet Take 1 tablet (4 mg total) by mouth every 8 (eight) hours as needed for nausea or vomiting. Patient not taking: Reported on 04/07/2018 03/28/18   Gareth Morgan, MD  ondansetron (ZOFRAN ODT) 4 MG disintegrating tablet Take 1 tablet (4 mg total) by mouth every 8 (eight) hours as needed. Patient not taking: Reported on 08/30/2019 04/09/18   Fredia Sorrow, MD    Physical  Exam: Constitutional: Moderately built and nourished. Vitals:   08/30/19 2130 08/30/19 2145 08/30/19 2200 08/30/19 2215  BP: 138/63 (!) 159/68 (!) 129/52 132/65  Pulse: 64 72 75 74  Resp: 13 16 12 20   Temp:      TempSrc:      SpO2: 100% 100% 100% 97%   Eyes: Anicteric no pallor. ENMT: No discharge from the ears eyes nose or mouth. Neck: No masses.  No neck rigidity.  No JVD appreciated. Respiratory: No rhonchi or crepitations. Cardiovascular: S1-S2 heard. Abdomen: Soft nontender bowel sounds present. Musculoskeletal: No edema. Skin: No rash. Neurologic: Alert awake oriented to time place and person.  Moves all extremities. Psychiatric: Appears normal per normal affect.   Labs on Admission: I have personally reviewed following labs and imaging studies  CBC: Recent Labs  Lab 08/30/19 1928  WBC 7.1  HGB 10.8*  HCT 35.7*  MCV 86.0  PLT 123456   Basic Metabolic Panel: Recent Labs  Lab 08/30/19 1928  NA 137  K 3.4*  CL 101  CO2 27  GLUCOSE 115*  BUN 14  CREATININE 0.80  CALCIUM 9.1   GFR: CrCl cannot be calculated (Unknown ideal weight.). Liver Function Tests: No results for input(s): AST, ALT, ALKPHOS, BILITOT, PROT, ALBUMIN in the last 168 hours. No results for input(s): LIPASE, AMYLASE in the last  168 hours. No results for input(s): AMMONIA in the last 168 hours. Coagulation Profile: No results for input(s): INR, PROTIME in the last 168 hours. Cardiac Enzymes: No results for input(s): CKTOTAL, CKMB, CKMBINDEX, TROPONINI in the last 168 hours. BNP (last 3 results) No results for input(s): PROBNP in the last 8760 hours. HbA1C: No results for input(s): HGBA1C in the last 72 hours. CBG: No results for input(s): GLUCAP in the last 168 hours. Lipid Profile: No results for input(s): CHOL, HDL, LDLCALC, TRIG, CHOLHDL, LDLDIRECT in the last 72 hours. Thyroid Function Tests: No results for input(s): TSH, T4TOTAL, FREET4, T3FREE, THYROIDAB in the last 72 hours. Anemia Panel: No results for input(s): VITAMINB12, FOLATE, FERRITIN, TIBC, IRON, RETICCTPCT in the last 72 hours. Urine analysis:    Component Value Date/Time   COLORURINE STRAW (A) 08/30/2019 2228   APPEARANCEUR CLEAR 08/30/2019 2228   LABSPEC 1.003 (L) 08/30/2019 2228   PHURINE 6.0 08/30/2019 2228   GLUCOSEU NEGATIVE 08/30/2019 2228   HGBUR SMALL (A) 08/30/2019 2228   BILIRUBINUR NEGATIVE 08/30/2019 2228   KETONESUR NEGATIVE 08/30/2019 2228   PROTEINUR NEGATIVE 08/30/2019 2228   UROBILINOGEN 0.2 01/27/2015 1740   NITRITE NEGATIVE 08/30/2019 2228   LEUKOCYTESUR SMALL (A) 08/30/2019 2228   Sepsis Labs: @LABRCNTIP (procalcitonin:4,lacticidven:4) )No results found for this or any previous visit (from the past 240 hour(s)).   Radiological Exams on Admission: DG Chest 2 View  Result Date: 08/30/2019 CLINICAL DATA:  Chest pain EXAM: CHEST - 2 VIEW COMPARISON:  April 07, 2018 FINDINGS: The heart size and mediastinal contours are within normal limits. Aortic knob calcifications. Both lungs are clear. No acute osseous abnormality. IMPRESSION: No active cardiopulmonary disease. Electronically Signed   By: Prudencio Pair M.D.   On: 08/30/2019 20:03    EKG: Independently reviewed.  Normal sinus  rhythm.  Assessment/Plan Principal Problem:   Chest pain Active Problems:   Hypothyroidism   Hyperlipidemia   Essential hypertension   S/P primary angioplasty with coronary stent    1. Chest pain with history of CAD status post stenting will admit for further observation continue with patient's beta-blocker aspirin and statins.  Consult cardiology for further  recommendation. 2. Near syncopal episode with patient walking patient gets dizzy with palpitations.  Will closely monitor in telemetry.  Patient was not orthostatic.  Will check D-dimer. 3. Hypothyroidism on Synthroid.  Check TSH. 4. Hypertension on ARB hydrochlorothiazide and metoprolol. 5. Hypokalemia could be from hydrochlorothiazide replace recheck. 6. Recent left eye surgery for cataract continue eyedrops.   DVT prophylaxis: SCDs since patient had a recent eye surgery. Code Status: Full code. Family Communication: Discussed with patient. Disposition Plan: Home. Consults called: Cardiology. Admission status: Observation.   Rise Patience MD [facility] Pager [phone].  If 7PM-7AM, please contact night-coverage [email or web address] Password [facility]  08/30/2019, 11:33 PM

## 2019-08-30 NOTE — ED Notes (Signed)
Pt states that they are having difficulty breathing, sp02 at 100. Completed orthostatic VS, pt complained of feeling weak.

## 2019-08-30 NOTE — ED Notes (Signed)
Troponin drawn, labeled with 2 pt identifiers, and sent to lab 

## 2019-08-30 NOTE — ED Provider Notes (Signed)
[facility] EMERGENCY DEPARTMENT Provider Note   CSN: [id number] Arrival date & time: 08/30/19  1904     History Chief Complaint  Patient presents with   Chest Pain    [name] is a 74 y.o. female.  HPI  Patient reports multiple episodes today of feeling like she would pass out.  She had a cataract repair yesterday.  She reports she however had been feeling fine.  She was little drowsy afterwards but was not having other problems.  Today, she has felt occasional tightness in her chest as well as a feeling of profound weakness every time she tries to get up or do any activity.  No fever.  No cough.  No swelling in the legs.  She reports on route EMS did give her a nitroglycerin which helped with the chest pressure.  She is no longer having perception of chest tightness but still, feels profoundly weak every time she tries to get up.  She is not experiencing spinning dizziness or focal weakness numbness or tingling.    Past Medical History:  Diagnosis Date   Anginal pain ([place])    Carotid artery disease ([place])    a. Carotid US 9/17: R 1-39%; L 40-59% >> FU 1 year   Coronary atherosclerosis of native coronary artery    a. DES to LCx in 07/2010 b. DES to RCA in 04/2013   Exertional shortness of breath    Heart murmur    Hiatal hernia    History of blood transfusion    "after daughter was born" (05/22/2013)   HLD (hyperlipidemia)    HTN (hypertension)    Hypothyroidism    Osteoporosis    Palpitations    Premature ventricular contractions    Recurrent UTI    "q 4 months for the last couple years; last one was 12/2012" (05/22/2013)   Sinus headache    Tremors of nervous system    by dr. love    Patient Active Problem List   Diagnosis Date Noted   Abnormal EKG 10/19/2016   Carotid artery disease (HCC)    Pain in the chest 02/25/2016   Chest pain 01/27/2015   S/P primary angioplasty with coronary stent 05/24/2013   Hypokalemia  05/24/2013   Hypothyroidism 08/14/2010   Hyperlipidemia 08/14/2010   Essential hypertension 08/14/2010   CAD, NATIVE VESSEL 08/14/2010   PREMATURE VENTRICULAR CONTRACTIONS 08/14/2010   HIATAL HERNIA 08/14/2010    Past Surgical History:  Procedure Laterality Date   APPENDECTOMY     BREAST CYST ASPIRATION Bilateral    "numerous in the dr's office" (05/22/2013)   BREAST CYST EXCISION Right 1965   BREAST LUMPECTOMY Right 1980's   "benign" (05/22/2013)   CARDIAC CATHETERIZATION  ~ 1968; 08/2010   CORONARY ANGIOPLASTY WITH STENT PLACEMENT  07/2010   CORONARY ANGIOPLASTY WITH STENT PLACEMENT  05/23/2013   Patent LCx stent (10-20% ISR), 70% mid RCA (FFR 0.73) s/p DES, 20% prox RCA, 40% ostial D1, 60% distal posterior AV branch; EF 65-70%   [place]   "ruptured blood vessel in my side during childbirth; didn't find it til 2 days later; had to open me up to repair it" (05/22/2013)   LEFT HEART CATHETERIZATION WITH CORONARY ANGIOGRAM N/A 05/23/2013   Procedure: LEFT HEART CATHETERIZATION WITH CORONARY ANGIOGRAM;  Surgeon: Wellington Hampshire, MD;  Location: [facility] CATH [facility];  Service: Cardiovascular;  Laterality: N/A;   LEFT HEART CATHETERIZATION WITH CORONARY ANGIOGRAM N/A 01/28/2014  Procedure: LEFT HEART CATHETERIZATION WITH CORONARY ANGIOGRAM;  Surgeon: Blane Ohara, MD;  Location: [facility] CATH [facility];  Service: Cardiovascular;  Laterality: N/A;   TONSILLECTOMY     TOTAL ABDOMINAL HYSTERECTOMY       OB History    Gravida  3   Para  2   Term      Preterm      AB      Living  2     SAB      TAB      Ectopic      Multiple      Live Births              Family History  Problem Relation Age of Onset   CAD Mother    Lymphoma Mother    Heart disease Father    Heart attack Brother 46   Stroke Other     Social History   Tobacco Use   Smoking status: Never Smoker   Smokeless tobacco: Never Used  Substance Use  Topics   Alcohol use: No   Drug use: No    Home Medications Prior to Admission medications   Medication Sig Start Date End Date Taking? Authorizing Provider  aspirin (ASPIR-81) 81 MG EC tablet Take 81 mg by mouth daily.     Yes [provider]  Calcium Carbonate (CALTRATE 600) 1500 MG TABS Take 600 mg of elemental calcium by mouth 2 (two) times daily.    Yes [provider]  esomeprazole (NEXIUM) 40 MG capsule Take 40 mg by mouth daily.     Yes [provider]  levothyroxine (SYNTHROID, LEVOTHROID) 50 MCG tablet Take 50 mcg by mouth daily before breakfast.   Yes [provider]  loteprednol (LOTEMAX) 0.5 % ophthalmic suspension Place 1 drop into the left eye in the morning, at noon, in the evening, and at bedtime.   Yes [provider]  metoprolol tartrate (LOPRESSOR) 25 MG tablet Take 0.5 tablets (12.5 mg total) by mouth 2 (two) times daily. 04/19/19  Yes Fay Records, MD  moxifloxacin (VIGAMOX) 0.5 % ophthalmic solution Place 1 drop into the left eye in the morning, at noon, in the evening, and at bedtime.   Yes [provider]  Multiple Vitamins-Minerals (MULTIVITAL) tablet Take 1 tablet by mouth daily.     Yes [provider]  nitroGLYCERIN (NITROSTAT) 0.4 MG SL tablet DISSOLVE ONE TABLET UNDER TONGUE AS NEEDED FOR CHEST PAIN EVERY 5 MINUTES FOR 3 DOSES Patient taking differently: Place 0.4 mg under the tongue every 5 (five) minutes as needed for chest pain.  04/19/19  Yes Fay Records, MD  rosuvastatin (CRESTOR) 20 MG tablet Take 1 tablet (20 mg total) by mouth at bedtime. 04/19/19  Yes Fay Records, MD  valsartan-hydrochlorothiazide (DIOVAN HCT) 160-12.5 MG tablet Take 1 tablet by mouth daily. 04/19/19  Yes Fay Records, MD  ondansetron (ZOFRAN ODT) 4 MG disintegrating tablet Take 1 tablet (4 mg total) by mouth every 8 (eight) hours as needed for nausea or vomiting. Patient not taking: Reported on 04/07/2018 03/28/18    Gareth Morgan, MD  ondansetron (ZOFRAN ODT) 4 MG disintegrating tablet Take 1 tablet (4 mg total) by mouth every 8 (eight) hours as needed. Patient not taking: Reported on 08/30/2019 04/09/18   Fredia Sorrow, MD    Allergies    Atorvastatin, Codeine, Flagyl [metronidazole hcl], Metronidazole, Oseltamivir phosphate, Prednisone, Simvastatin, Sulfa antibiotics, Tamiflu [oseltamivir], and Penicillins  Review of Systems  Review of Systems 10 Systems reviewed and are negative for acute change except as noted in the HPI.  Physical Exam Updated Vital Signs BP 132/65    Pulse 74    Temp 98.8 F (37.1 C) (Oral)    Resp 20    SpO2 97%   Physical Exam Constitutional:      Appearance: She is well-developed.  HENT:     Head: Normocephalic and atraumatic.  Eyes:     Pupils: Pupils are equal, round, and reactive to light.  Cardiovascular:     Rate and Rhythm: Normal rate and regular rhythm.     Heart sounds: Normal heart sounds.  Pulmonary:     Effort: Pulmonary effort is normal.     Breath sounds: Normal breath sounds.  Abdominal:     General: Bowel sounds are normal. There is no distension.     Palpations: Abdomen is soft.     Tenderness: There is no abdominal tenderness.  Musculoskeletal:        General: Normal range of motion.     Cervical back: Neck supple.  Skin:    General: Skin is warm and dry.  Neurological:     General: No focal deficit present.     Mental Status: She is alert and oriented to person, place, and time.     GCS: GCS eye subscore is 4. GCS verbal subscore is 5. GCS motor subscore is 6.     Coordination: Coordination normal.  Psychiatric:        Mood and Affect: Mood normal.     ED Results / Procedures / Treatments   Labs (all labs ordered are listed, but only abnormal results are displayed) Labs Reviewed  BASIC METABOLIC PANEL - Abnormal; Notable for the following components:      Result Value   Potassium 3.4 (*)    Glucose, Bld 115 (*)    All other  components within normal limits  CBC - Abnormal; Notable for the following components:   Hemoglobin 10.8 (*)    HCT 35.7 (*)    All other components within normal limits  URINALYSIS, ROUTINE W REFLEX MICROSCOPIC - Abnormal; Notable for the following components:   Color, Urine STRAW (*)    Specific Gravity, Urine 1.003 (*)    Hgb urine dipstick SMALL (*)    Leukocytes,Ua SMALL (*)    All other components within normal limits  SARS CORONAVIRUS 2 (TAT 6-24 HRS)  TROPONIN I (HIGH SENSITIVITY)  TROPONIN I (HIGH SENSITIVITY)  TROPONIN I (HIGH SENSITIVITY)    EKG EKG Interpretation  Date/Time:  Thursday August 30 2019 19:15:42 EST Ventricular Rate:  79 PR Interval:  160 QRS Duration: 76 QT Interval:  406 QTC Calculation: 465 R Axis:   10 Text Interpretation: Normal sinus rhythm Minimal voltage criteria for LVH, may be normal variant ( R in aVL ) Nonspecific ST abnormality Abnormal ECG no STEMI, no sig change from previous Confirmed by Charlesetta Shanks [phone]) on 08/30/2019 9:54:33 PM   Radiology DG Chest 2 View  Result Date: 08/30/2019 CLINICAL DATA:  Chest pain EXAM: CHEST - 2 VIEW COMPARISON:  April 07, 2018 FINDINGS: The heart size and mediastinal contours are within normal limits. Aortic knob calcifications. Both lungs are clear. No acute osseous abnormality. IMPRESSION: No active cardiopulmonary disease. Electronically Signed   By: Prudencio Pair M.D.   On: 08/30/2019 20:03    Procedures Procedures (including critical care time)  Medications Ordered in ED Medications  sodium chloride flush (NS) 0.9 %  injection 3 mL (3 mLs Intravenous Not Given 08/30/19 2027)  aspirin EC tablet 81 mg (has no administration in time range)  metoprolol tartrate (LOPRESSOR) tablet 12.5 mg (12.5 mg Oral Given 08/30/19 2354)  nitroGLYCERIN (NITROSTAT) SL tablet 0.4 mg (has no administration in time range)  rosuvastatin (CRESTOR) tablet 20 mg (20 mg Oral Given 08/30/19 2354)    valsartan-hydrochlorothiazide (DIOVAN-HCT) 160-12.5 MG per tablet 1 tablet (has no administration in time range)  levothyroxine (SYNTHROID) tablet 50 mcg (has no administration in time range)  pantoprazole (PROTONIX) EC tablet 40 mg (has no administration in time range)  loteprednol (LOTEMAX) 0.5 % ophthalmic suspension 1 drop (has no administration in time range)  gatifloxacin (ZYMAXID) 0.5 % ophthalmic drops 1 drop (has no administration in time range)  acetaminophen (TYLENOL) tablet 650 mg (has no administration in time range)    Or  acetaminophen (TYLENOL) suppository 650 mg (has no administration in time range)  ondansetron (ZOFRAN) tablet 4 mg (has no administration in time range)    Or  ondansetron (ZOFRAN) injection 4 mg (has no administration in time range)    ED Course  I have reviewed the triage vital signs and the nursing notes.  Pertinent labs & imaging results that were available during my care of the patient were reviewed by me and considered in my medical decision making (see chart for details).    MDM Rules/Calculators/A&P                     Patient has known history of coronary artery disease with stents.  She has not had fever chills or signs suggestive of infectious illness.  She has had some chest pressure and profound feeling of general weakness off and on today.  Diagnostic work-up is unrevealing at this time.  Patient however continues to remain symptomatic in the emergency department.  She is not orthostatic but with any ambulating or standing, she continues to feel extremely weak.  This does not seem vertiginous.  No focal neuro deficits to suggest stroke symptoms.  Possible anginal equivalent.  Will plan for observation admission. Final Clinical Impression(s) / ED Diagnoses Final diagnoses:  Near syncope  Precordial chest pain    Rx / DC Orders ED Discharge Orders    None       Charlesetta Shanks, MD 08/31/19 0001

## 2019-08-30 NOTE — ED Notes (Signed)
Pt complaining of worsening chest pain, VS rechecked. Sort RN notified.

## 2019-08-30 NOTE — ED Triage Notes (Signed)
Pt BIB [facility] from home, c/o chest pressure x 5 hours, took tums with no relief. Denies shortness of breath or cough. Given 324mg  asa and 1 NTG with some relief. Rates pressure 2/10 at this time. Hx stent placement.

## 2019-08-31 DIAGNOSIS — I10 Essential (primary) hypertension: Secondary | ICD-10-CM | POA: Diagnosis not present

## 2019-08-31 DIAGNOSIS — R079 Chest pain, unspecified: Secondary | ICD-10-CM | POA: Diagnosis not present

## 2019-08-31 DIAGNOSIS — E782 Mixed hyperlipidemia: Secondary | ICD-10-CM

## 2019-08-31 DIAGNOSIS — Z955 Presence of coronary angioplasty implant and graft: Secondary | ICD-10-CM

## 2019-08-31 LAB — D-DIMER, QUANTITATIVE: D-Dimer, Quant: 0.48 ug/mL-FEU (ref 0.00–0.50)

## 2019-08-31 LAB — BASIC METABOLIC PANEL
Anion gap: 10 (ref 5–15)
BUN: 9 mg/dL (ref 8–23)
CO2: 28 mmol/L (ref 22–32)
Calcium: 9.3 mg/dL (ref 8.9–10.3)
Chloride: 103 mmol/L (ref 98–111)
Creatinine, Ser: 0.83 mg/dL (ref 0.44–1.00)
GFR calc Af Amer: >60 mL/min (ref >60)
GFR calc non Af Amer: >60 mL/min (ref >60)
Glucose, Bld: 97 mg/dL (ref 70–99)
Potassium: 3.5 mmol/L (ref 3.5–5.1)
Sodium: 141 mmol/L (ref 135–145)

## 2019-08-31 LAB — HEPATIC FUNCTION PANEL
ALT: 21 U/L (ref 0–44)
AST: 27 U/L (ref 15–41)
Albumin: 3.4 g/dL — ABNORMAL LOW (ref 3.5–5.0)
Alkaline Phosphatase: 66 U/L (ref 38–126)
Bilirubin, Direct: 0.2 mg/dL (ref 0.0–0.2)
Indirect Bilirubin: 1.1 mg/dL — ABNORMAL HIGH (ref 0.3–0.9)
Total Bilirubin: 1.3 mg/dL — ABNORMAL HIGH (ref 0.3–1.2)
Total Protein: 6 g/dL — ABNORMAL LOW (ref 6.5–8.1)

## 2019-08-31 LAB — CBC WITH DIFFERENTIAL/PLATELET
Abs Immature Granulocytes: 0.03 10*3/uL (ref 0.00–0.07)
Basophils Absolute: 0.1 10*3/uL (ref 0.0–0.1)
Basophils Relative: 1 %
Eosinophils Absolute: 0.2 10*3/uL (ref 0.0–0.5)
Eosinophils Relative: 3 %
HCT: 34.9 % — ABNORMAL LOW (ref 36.0–46.0)
Hemoglobin: 10.6 g/dL — ABNORMAL LOW (ref 12.0–15.0)
Immature Granulocytes: 0 %
Lymphocytes Relative: 27 %
Lymphs Abs: 2.4 10*3/uL (ref 0.7–4.0)
MCH: 25.7 pg — ABNORMAL LOW (ref 26.0–34.0)
MCHC: 30.4 g/dL (ref 30.0–36.0)
MCV: 84.7 fL (ref 80.0–100.0)
Monocytes Absolute: 0.6 10*3/uL (ref 0.1–1.0)
Monocytes Relative: 6 %
Neutro Abs: 5.7 10*3/uL (ref 1.7–7.7)
Neutrophils Relative %: 63 %
Platelets: 257 10*3/uL (ref 150–400)
RBC: 4.12 MIL/uL (ref 3.87–5.11)
RDW: 14 % (ref 11.5–15.5)
WBC: 8.9 10*3/uL (ref 4.0–10.5)
nRBC: 0 % (ref 0.0–0.2)

## 2019-08-31 LAB — SARS CORONAVIRUS 2 (TAT 6-24 HRS): SARS Coronavirus 2: NEGATIVE

## 2019-08-31 LAB — TROPONIN I (HIGH SENSITIVITY)
Troponin I (High Sensitivity): 3 ng/L (ref <18)
Troponin I (High Sensitivity): 4 ng/L (ref <18)

## 2019-08-31 LAB — TSH: TSH: 2.783 u[IU]/mL (ref 0.350–4.500)

## 2019-08-31 MED ORDER — IRBESARTAN 150 MG PO TABS
150.0000 mg | ORAL_TABLET | Freq: Every day | ORAL | Status: DC
  Administered 2019-08-31: 150 mg via ORAL
  Filled 2019-08-31: qty 1

## 2019-08-31 MED ORDER — POTASSIUM CHLORIDE CRYS ER 20 MEQ PO TBCR
20.0000 meq | EXTENDED_RELEASE_TABLET | Freq: Once | ORAL | Status: AC
  Administered 2019-08-31: 20 meq via ORAL
  Filled 2019-08-31: qty 1

## 2019-08-31 MED ORDER — HYDRALAZINE HCL 25 MG PO TABS
25.0000 mg | ORAL_TABLET | Freq: Four times a day (QID) | ORAL | Status: DC | PRN
  Administered 2019-08-31: 25 mg via ORAL
  Filled 2019-08-31: qty 1

## 2019-08-31 MED ORDER — ASPIRIN EC 81 MG PO TBEC: 81.0000 mg | DELAYED_RELEASE_TABLET | Freq: Every day | ORAL | Status: DC

## 2019-08-31 MED ORDER — HYDROCHLOROTHIAZIDE 12.5 MG PO CAPS
12.5000 mg | ORAL_CAPSULE | Freq: Every day | ORAL | Status: DC
  Administered 2019-08-31: 12.5 mg via ORAL
  Filled 2019-08-31: qty 1

## 2019-08-31 NOTE — ED Notes (Signed)
Pt transported to RM 2 on 4E in NAD with all belongings. Pt alert, speaking in full sentences. Breathing easy, non-labored. Equal rise and fall of chest noted.

## 2019-08-31 NOTE — Consult Note (Signed)
Cardiology Consultation:   Patient ID: [name] MRN: [id number]; DOB: 11-22-1945  Admit date: 08/30/2019 Date of Consult: 08/31/2019  Primary Care Provider: Lawerance Cruel, MD Primary Cardiologist: Dorris Carnes, MD Primary Electrophysiologist:  None   Patient Profile:   [name] is a 74 y.o. female with a hx of CAD and coronary PCI who is being seen today for the evaluation of weakness,dizziness, and mild chest discomfort at the request of Dr. .Hal Hope. There is a h/o RCA()2014) and circumflex(2012) stents.  Last cardiac catheterization in 2015 after the patient developed weakness and fleeting episodes of chest pressure revealed widely patent stents that had been previously placed.  Other medical problems include hypertension, hyperlipidemia, hypothyroidism, history of palpitations/PVCs, and nonobstructive carotid artery disease.  History of Present Illness:   [name] is a pleasant 74 year old with prior history of coronary artery disease and stenting as noted above.  She presented to [facility] yesterday after developing extreme fatigue and weakness 1 day following left cataract surgery.  This feeling persisted throughout the day and she noted mild tightness in the chest when she tried to move around.  This caused concern and she called 911.  Upon arrival the patient was given aspirin and 1 sublingual nitroglycerin which she states briefly relieved the discomfort.  She was taken to the emergency room without any acute EKG changes.  She was not immediately admitted to a bed.  She states that there was an additional episode of mild discomfort while in the lobby.  When she finally got into the room she was able to relax and states that her chest discomfort resolved.  She is unable to specify the duration of chest discomfort.  Cardiac evaluation since admission includes nonischemic EKG, and high-sensitivity troponin I values all less than 5 x4.  In speaking with the patient, she  feels fatigued just lying in bed.  She needed to roll over to the right side for auscultation of her lungs and became extremely weak and fatigued.  She was not necessarily dyspneic.  There was no associated chest pain.  She was finally able to sit and states that she felt stronger than she has earlier this morning and yesterday.  The last heart catheterization was done under circumstances of weakness, vague chest discomfort, and dizziness in 2015.  That procedure did not demonstrate obstructive disease and revealed continued patency of circumflex and RCA stents.  Heart Pathway Score:     Past Medical History:  Diagnosis Date  . Anginal pain ([place])   . Carotid artery disease ([place])    a. Carotid US 9/17: R 1-39%; L 40-59% >> FU 1 year  . Coronary atherosclerosis of native coronary artery    a. DES to LCx in 07/2010 b. DES to RCA in 04/2013  . Exertional shortness of breath   . Heart murmur   . Hiatal hernia   . History of blood transfusion    "after daughter was born" (05/22/2013)  . HLD (hyperlipidemia)   . HTN (hypertension)   . Hypothyroidism   . Osteoporosis   . Palpitations   . Premature ventricular contractions   . Recurrent UTI    "q 4 months for the last couple years; last one was 12/2012" (05/22/2013)  . Sinus headache   . Tremors of nervous system    by dr. love    Past Surgical History:  Procedure Laterality Date  . APPENDECTOMY    . BREAST CYST ASPIRATION Bilateral    "numerous in the  dr's office" (05/22/2013)  . BREAST CYST EXCISION Right 1965  . BREAST LUMPECTOMY Right 1980's   "benign" (05/22/2013)  . CARDIAC CATHETERIZATION  ~ 1968; 08/2010  . CORONARY ANGIOPLASTY WITH STENT PLACEMENT  07/2010  . CORONARY ANGIOPLASTY WITH STENT PLACEMENT  05/23/2013   Patent LCx stent (10-20% ISR), 70% mid RCA (FFR 0.73) s/p DES, 20% prox RCA, 40% ostial D1, 60% distal posterior AV branch; EF 65-70%  . ECTOPIC PREGNANCY SURGERY    . LAPAROTOMY  1965   "ruptured blood vessel in my  side during childbirth; didn't find it til 2 days later; had to open me up to repair it" (05/22/2013)  . LEFT HEART CATHETERIZATION WITH CORONARY ANGIOGRAM N/A 05/23/2013   Procedure: LEFT HEART CATHETERIZATION WITH CORONARY ANGIOGRAM;  Surgeon: Wellington Hampshire, MD;  Location: [place] CATH LAB;  Service: Cardiovascular;  Laterality: N/A;  . LEFT HEART CATHETERIZATION WITH CORONARY ANGIOGRAM N/A 01/28/2014   Procedure: LEFT HEART CATHETERIZATION WITH CORONARY ANGIOGRAM;  Surgeon: Blane Ohara, MD;  Location: [facility] CATH LAB;  Service: Cardiovascular;  Laterality: N/A;  . TONSILLECTOMY    . TOTAL ABDOMINAL HYSTERECTOMY       Home Medications:  Prior to Admission medications   Medication Sig Start Date End Date Taking? Authorizing Provider  aspirin (ASPIR-81) 81 MG EC tablet Take 81 mg by mouth daily.     Yes [provider]  Calcium Carbonate (CALTRATE 600) 1500 MG TABS Take 600 mg of elemental calcium by mouth 2 (two) times daily.    Yes [provider]  esomeprazole (NEXIUM) 40 MG capsule Take 40 mg by mouth daily.     Yes [provider]  levothyroxine (SYNTHROID, LEVOTHROID) 50 MCG tablet Take 50 mcg by mouth daily before breakfast.   Yes [provider]  loteprednol (LOTEMAX) 0.5 % ophthalmic suspension Place 1 drop into the left eye in the morning, at noon, in the evening, and at bedtime.   Yes [provider]  metoprolol tartrate (LOPRESSOR) 25 MG tablet Take 0.5 tablets (12.5 mg total) by mouth 2 (two) times daily. 04/19/19  Yes Fay Records, MD  moxifloxacin (VIGAMOX) 0.5 % ophthalmic solution Place 1 drop into the left eye in the morning, at noon, in the evening, and at bedtime.   Yes [provider]  Multiple Vitamins-Minerals (MULTIVITAL) tablet Take 1 tablet by mouth daily.     Yes [provider]  nitroGLYCERIN (NITROSTAT) 0.4 MG SL tablet DISSOLVE ONE TABLET UNDER TONGUE AS NEEDED FOR CHEST PAIN EVERY 5 MINUTES FOR 3  DOSES Patient taking differently: Place 0.4 mg under the tongue every 5 (five) minutes as needed for chest pain.  04/19/19  Yes Fay Records, MD  rosuvastatin (CRESTOR) 20 MG tablet Take 1 tablet (20 mg total) by mouth at bedtime. 04/19/19  Yes Fay Records, MD  valsartan-hydrochlorothiazide (DIOVAN HCT) 160-12.5 MG tablet Take 1 tablet by mouth daily. 04/19/19  Yes Fay Records, MD  ondansetron (ZOFRAN ODT) 4 MG disintegrating tablet Take 1 tablet (4 mg total) by mouth every 8 (eight) hours as needed for nausea or vomiting. Patient not taking: Reported on 04/07/2018 03/28/18   Gareth Morgan, MD  ondansetron (ZOFRAN ODT) 4 MG disintegrating tablet Take 1 tablet (4 mg total) by mouth every 8 (eight) hours as needed. Patient not taking: Reported on 08/30/2019 04/09/18   Fredia Sorrow, MD    Inpatient Medications: Scheduled Meds: . aspirin EC  81 mg Oral QHS  . gatifloxacin  1 drop  Left Eye QID  . irbesartan  150 mg Oral Daily   And  . hydrochlorothiazide  12.5 mg Oral Daily  . levothyroxine  50 mcg Oral Q0600  . loteprednol  1 drop Left Eye 4x daily  . metoprolol tartrate  12.5 mg Oral BID  . pantoprazole  40 mg Oral Daily  . rosuvastatin  20 mg Oral QHS  . sodium chloride flush  3 mL Intravenous Once   Continuous Infusions:  PRN Meds: acetaminophen **OR** acetaminophen, nitroGLYCERIN, ondansetron **OR** ondansetron (ZOFRAN) IV  Allergies:    Allergies  Allergen Reactions  . Atorvastatin     REACTION: myaligia  . Codeine Hives and Rash  . Flagyl [Metronidazole Hcl] Anaphylaxis  . Metronidazole Nausea And Vomiting, Rash and Other (See Comments)    Chest pain   . Oseltamivir Phosphate Itching  . Prednisone Palpitations    Makes my heart race  . Simvastatin Other (See Comments)    REACTION: myalgia  . Sulfa Antibiotics Other (See Comments) and Rash    Jittery  . Tamiflu [Oseltamivir] Itching  . Penicillins Nausea And Vomiting and Rash    Did it involve swelling of the  face/tongue/throat, SOB, or low BP? No Did it involve sudden or severe rash/hives, skin peeling, or any reaction on the inside of your mouth or nose? Yes Did you need to seek medical attention at a hospital or doctor's office? Yes When did it last happen?74 years old  If all above answers are "NO", may proceed with cephalosporin use.    Social History:   Social History   Socioeconomic History  . Marital status: Married    Spouse name: Not on file  . Number of children: Not on file  . Years of education: Not on file  . Highest education level: Not on file  Occupational History  . Occupation: Retired Oceanographer  Tobacco Use  . Smoking status: Never Smoker  . Smokeless tobacco: Never Used  Substance and Sexual Activity  . Alcohol use: No  . Drug use: No  . Sexual activity: Not on file  Other Topics Concern  . Not on file  Social History Narrative  . Not on file   Social Determinants of Health   Financial Resource Strain:   . Difficulty of Paying Living Expenses: Not on file  Food Insecurity:   . Worried About Charity fundraiser in the Last Year: Not on file  . Ran Out of Food in the Last Year: Not on file  Transportation Needs:   . Lack of Transportation (Medical): Not on file  . Lack of Transportation (Non-Medical): Not on file  Physical Activity:   . Days of Exercise per Week: Not on file  . Minutes of Exercise per Session: Not on file  Stress:   . Feeling of Stress : Not on file  Social Connections:   . Frequency of Communication with Friends and Family: Not on file  . Frequency of Social Gatherings with Friends and Family: Not on file  . Attends Religious Services: Not on file  . Active Member of Clubs or Organizations: Not on file  . Attends Archivist Meetings: Not on file  . Marital Status: Not on file  Intimate Partner Violence:   . Fear of Current or Ex-Partner: Not on file  . Emotionally Abused: Not on file  . Physically Abused: Not  on file  . Sexually Abused: Not on file    Family History:    Family History  Problem Relation Age of Onset  . CAD Mother   . Lymphoma Mother   . Heart disease Father   . Heart attack Brother 83  . Stroke Other      ROS:  Please see the history of present illness.  Somewhat anxious following left eye cataract surgery.  Has not been resting well.  She is still disturbed by the death of her husband 3 years ago.  She otherwise has no specific complaints. All other ROS reviewed and negative.     Physical Exam/Data:   Vitals:   08/31/19 0053 08/31/19 0125 08/31/19 0335 08/31/19 0759  BP: 118/88 (!) 146/82 (!) 154/61 (!) 152/58  Pulse: 84 71 78 80  Resp: 19 12 (!) 24 20  Temp: 98.3 F (36.8 C)  97.7 F (36.5 C) 98.4 F (36.9 C)  TempSrc: Oral  Oral Oral  SpO2: 100% 99% 98% 98%  Weight:      Height:        Intake/Output Summary (Last 24 hours) at 08/31/2019 1128 Last data filed at 08/31/2019 0100 Gross per 24 hour  Intake 360 ml  Output --  Net 360 ml   Last 3 Weights 08/31/2019 04/19/2019 04/09/2018  Weight (lbs) 135 lb 9.3 oz 133 lb 138 lb  Weight (kg) 61.5 kg 60.328 kg 62.596 kg     Body mass index is 24.8 kg/m.  General:  Well nourished, well developed, in no acute distress HEENT: normal Lymph: no adenopathy Neck: no JVD Endocrine:  No thryomegaly Vascular: No carotid bruits; FA pulses 2+ bilaterally without bruits  Cardiac:  normal S1, S2; RRR; no murmur  Lungs:  clear to auscultation bilaterally, no wheezing, rhonchi or rales  Abd: soft, nontender, no hepatomegaly  Ext: no edema Musculoskeletal:  No deformities, BUE and BLE strength normal and equal Skin: warm and dry  Neuro:  CNs 2-12 intact, no focal abnormalities noted Psych:  Normal affect   EKG:  The EKG was personally reviewed and demonstrates: Normal sinus rhythm, early precordial QRS transition, and when compared to the most recent prior tracing from September 2019, no significant changes noted.  ECG was  personally reviewed.  Telemetry:  Telemetry was personally reviewed and demonstrates: Normal sinus rhythm with occasional PVCs are noted on review of telemetry.  Tracings and alarms were personally reviewed.  Relevant CV Studies: Prior coronary angiography from 2015 was personally reviewed  Laboratory Data:  High Sensitivity Troponin:   Recent Labs  Lab 08/30/19 1928 08/30/19 2155 08/31/19 0000 08/31/19 0159  TROPONINIHS 3 3 3 4      Chemistry Recent Labs  Lab 08/30/19 1928 08/31/19 0705  NA 137 141  K 3.4* 3.5  CL 101 103  CO2 27 28  GLUCOSE 115* 97  BUN 14 9  CREATININE 0.80 0.83  CALCIUM 9.1 9.3  GFRNONAA >60 >60  GFRAA >60 >60  ANIONGAP 9 10    Recent Labs  Lab 08/31/19 0705  PROT 6.0*  ALBUMIN 3.4*  AST 27  ALT 21  ALKPHOS 66  BILITOT 1.3*   Hematology Recent Labs  Lab 08/30/19 1928 08/31/19 0705  WBC 7.1 8.9  RBC 4.15 4.12  HGB 10.8* 10.6*  HCT 35.7* 34.9*  MCV 86.0 84.7  MCH 26.0 25.7*  MCHC 30.3 30.4  RDW 13.9 14.0  PLT 266 257   BNPNo results for input(s): BNP, PROBNP in the last 168 hours.  DDimer  Recent Labs  Lab 08/31/19 0705  DDIMER 0.48     Radiology/Studies:  DG Chest  2 View  Result Date: 08/30/2019 CLINICAL DATA:  Chest pain EXAM: CHEST - 2 VIEW COMPARISON:  April 07, 2018 FINDINGS: The heart size and mediastinal contours are within normal limits. Aortic knob calcifications. Both lungs are clear. No acute osseous abnormality. IMPRESSION: No active cardiopulmonary disease. Electronically Signed   By: Prudencio Pair M.D.   On: 08/30/2019 20:03          [id number] HEAR Score (for undifferentiated chest pain):    5.  Low risk troponin profile.  Assessment and Plan:   1. Chest discomfort, in the setting of prior coronary stenting most recently in 2014.  Subsequent cath in 2015 demonstrated widely patent stents.  Current presentation is atypical and associated with other complaints including dizziness and extreme fatigue.   No EKG evidence of ischemia and cardiac markers are negative x4.  Recommend ambulation, repeat EKG, and if feels well without concerns, consider discharge with follow-up with primary cardiologist in 1 to 2 weeks. 2. Coronary atherosclerotic heart disease, with prior RCA and circumflex stents most recently 2014.  Secondary prevention should be continued aggressively. 3. Essential hypertension, controlled since admission. 4. Elevated LDL cholesterol, without recent laboratory data 5. Left cataract surgery within the last 48 hours.  Overall, the patient is hemodynamically stable, has an elevated HEAR score but low risk high-sensitivity troponin I evaluation x4.  Given similar prior presentations, it would be appropriate to discharge the patient if she is able to ambulate without significant symptoms or evidence of EKG changes.  She should follow-up with primary cardiologist within the next 7 days.      For questions or updates, please contact [place] Please consult [email or web address] for contact info under     Signed, Sinclair Grooms, MD  08/31/2019 11:28 AM

## 2019-08-31 NOTE — ED Notes (Signed)
Available meds given per [facility]. Name/DOB verified with pt. Pt remaisn on cart in NAD. VSS. Breathing easy, non-labored. Equal rise and fall of chest noted. Will continue to monitor.  covid swab collected, labeled with 2 pt identifiers, and brought to lab Troponin drawn, labeled with 2 pt identifiers, and sent to lab

## 2019-08-31 NOTE — ED Notes (Signed)
Report given to [facility], RN on 4E. All questions answered

## 2019-08-31 NOTE — Evaluation (Signed)
Physical Therapy Evaluation Patient Details Name: [name] MRN: [id number] DOB: [birth year] Today's Date: 08/31/2019   History of Present Illness  Pt is a 74 y/o female admitted secondary to weakness, dizziness, and chest pressure. PMH includes HTN, and CAD s/p stent placement.   Clinical Impression  Pt admitted secondary to problem above with deficits below. Pt very cautious during gait and reporting some fatigue, however, no other symptoms reported. Orthostatics negative. Pt requiring min to min guard A for mobility. Educated about having family assist and using RW at d/c and pt agreeable. Will continue to follow acutely to maximize functional mobility independence and safety.     Follow Up Recommendations Home health PT    Equipment Recommendations  None recommended by PT    Recommendations for Other Services       Precautions / Restrictions Precautions Precautions: Fall Restrictions Weight Bearing Restrictions: No      Mobility  Bed Mobility Overal bed mobility: Modified Independent                Transfers Overall transfer level: Needs assistance Equipment used: Rolling walker (2 wheeled);None Transfers: Sit to/from Stand Sit to Stand: Min guard         General transfer comment: Min guard for safety. Pt asymptomatic upon standing.   Ambulation/Gait Ambulation/Gait assistance: Min guard;Min assist Gait Distance (Feet): 50 Feet Assistive device: None Gait Pattern/deviations: Step-through pattern;Decreased stride length Gait velocity: Decreased   General Gait Details: Slow, very cautious gait. 1 LOB requiring min A. Otherwise required min guard A for safety. Pt reports fatigue, however, otherwise asymptomatic.   Stairs            Wheelchair Mobility    Modified Rankin (Stroke Patients Only)       Balance Overall balance assessment: Needs assistance Sitting-balance support: No upper extremity supported;Feet supported Sitting  balance-Leahy Scale: Good     Standing balance support: No upper extremity supported;During functional activity Standing balance-Leahy Scale: Fair                               Pertinent Vitals/Pain Pain Assessment: 0-10 Pain Score: 5  Pain Location: headache Pain Descriptors / Indicators: Headache Pain Intervention(s): Limited activity within patient's tolerance;Monitored during session;Repositioned    Home Living Family/patient expects to be discharged to:: Private residence Living Arrangements: Alone Available Help at Discharge: Family Type of Home: House Home Access: Stairs to enter Entrance Stairs-Rails: None Entrance Stairs-Number of Steps: 1 Home Layout: One level Home Equipment: Environmental consultant - 2 wheels;Cane - single point Additional Comments: Reports daughter and son     Prior Function Level of Independence: Independent               Hand Dominance        Extremity/Trunk Assessment   Upper Extremity Assessment Upper Extremity Assessment: Overall WFL for tasks assessed    Lower Extremity Assessment Lower Extremity Assessment: Generalized weakness    Cervical / Trunk Assessment Cervical / Trunk Assessment: Normal  Communication   Communication: No difficulties  Cognition Arousal/Alertness: Awake/alert Behavior During Therapy: WFL for tasks assessed/performed Overall Cognitive Status: Within Functional Limits for tasks assessed                                        General Comments General comments (skin integrity, edema, etc.): Educated about having  family stay with pt at d/c. Also educated about using RW at d/c to increase safety with gait.     Exercises     Assessment/Plan    PT Assessment Patient needs continued PT services  PT Problem List Decreased strength;Decreased balance;Decreased mobility;Decreased activity tolerance       PT Treatment Interventions Gait training;Stair training;Functional mobility  training;Therapeutic activities;Therapeutic exercise;Balance training;Patient/family education    PT Goals (Current goals can be found in the Care Plan section)  Acute Rehab PT Goals Patient Stated Goal: to feel better PT Goal Formulation: With patient Time For Goal Achievement: 09/14/19 Potential to Achieve Goals: Good    Frequency Min 3X/week   Barriers to discharge        Co-evaluation               AM-PAC PT "6 Clicks" Mobility  Outcome Measure Help needed turning from your back to your side while in a flat bed without using bedrails?: None Help needed moving from lying on your back to sitting on the side of a flat bed without using bedrails?: None Help needed moving to and from a bed to a chair (including a wheelchair)?: A Little Help needed standing up from a chair using your arms (e.g., wheelchair or bedside chair)?: A Little Help needed to walk in hospital room?: A Little Help needed climbing 3-5 steps with a railing? : A Little 6 Click Score: 20    End of Session Equipment Utilized During Treatment: Gait belt Activity Tolerance: Patient limited by fatigue Patient left: in bed;with call bell/phone within reach;with bed alarm set Nurse Communication: Mobility status PT Visit Diagnosis: Muscle weakness (generalized) (M62.81);Other abnormalities of gait and mobility (R26.89)    Time: [id number] PT Time Calculation (min) (ACUTE ONLY): 23 min   Charges:   PT Evaluation $PT Eval Low Complexity: 1 Low PT Treatments $Gait Training: 8-22 mins        Lou Miner, DPT  Acute Rehabilitation Services  Pager: [phone] Office: [phone]   Rudean Hitt 08/31/2019, 2:04 PM

## 2019-08-31 NOTE — TOC Transition Note (Addendum)
Transition of Care [facility]) - CM/SW Discharge Note Marvetta Gibbons RN, BSN Transitions of Care Unit 4E- RN Case Manager [phone]   Patient Details  Name: [name] MRN: [id number] Date of Birth: 1945/11/27  Transition of Care [facility]) CM/SW Contact:  Dawayne Patricia, RN Phone Number: 08/31/2019, 3:17 PM   Clinical Narrative:    Pt stable for transition home, order placed for HHPT, CM spoke with pt at bedside- list provided for [facility] choice Per CMS guidelines from medicare.gov website with star ratings (copy placed in shadow chart), per pt she would like to use [facility] for services- pt reports she has DME at home that was her husbands who passed 3 months ago. Pt has children nearby that have been assisting her. She states one of her kids will transport home. Pt reports she has no difficulties with affording her meds. Call made to [facility] with [facility] for [facility] referral- referral has been accepted. Address, phone # and PCP all confirmed in epic- pt states mobile # is best contact   Final next level of care: Home w Home Health Services Barriers to Discharge: No Barriers Identified   Patient Goals and CMS Choice Patient states their goals for this hospitalization and ongoing recovery are:: to get stronger CMS Medicare.gov Compare Post Acute Care list provided to:: Patient Choice offered to / list presented to : Patient  Discharge Placement       Home with [facility]                Discharge Plan and Services   Discharge Planning Services: CM Consult Post Acute Care Choice: Home Health          DME Arranged: N/A DME Agency: NA       HH Arranged: PT [place] Agency: [place] ([place]) Date HH Agency Contacted: 08/31/19 Time [place]: 1516 Representative spoke with at [place]: [place] ([place]) Interventions     Readmission Risk Interventions No flowsheet data found.

## 2019-09-01 DIAGNOSIS — E785 Hyperlipidemia, unspecified: Secondary | ICD-10-CM | POA: Diagnosis not present

## 2019-09-01 DIAGNOSIS — I25119 Atherosclerotic heart disease of native coronary artery with unspecified angina pectoris: Secondary | ICD-10-CM | POA: Diagnosis not present

## 2019-09-01 DIAGNOSIS — R42 Dizziness and giddiness: Secondary | ICD-10-CM | POA: Diagnosis not present

## 2019-09-01 DIAGNOSIS — M810 Age-related osteoporosis without current pathological fracture: Secondary | ICD-10-CM | POA: Diagnosis not present

## 2019-09-01 DIAGNOSIS — E039 Hypothyroidism, unspecified: Secondary | ICD-10-CM | POA: Diagnosis not present

## 2019-09-01 DIAGNOSIS — I10 Essential (primary) hypertension: Secondary | ICD-10-CM | POA: Diagnosis not present

## 2019-09-01 DIAGNOSIS — Z7982 Long term (current) use of aspirin: Secondary | ICD-10-CM | POA: Diagnosis not present

## 2019-09-01 DIAGNOSIS — Z95818 Presence of other cardiac implants and grafts: Secondary | ICD-10-CM | POA: Diagnosis not present

## 2019-09-01 DIAGNOSIS — Z87440 Personal history of urinary (tract) infections: Secondary | ICD-10-CM | POA: Diagnosis not present

## 2019-09-01 DIAGNOSIS — K449 Diaphragmatic hernia without obstruction or gangrene: Secondary | ICD-10-CM | POA: Diagnosis not present

## 2019-09-01 NOTE — Discharge Summary (Signed)
[facility] Discharge Summary   Patient: [name] [id number]  PCP: Lawerance Cruel, MD  Date of admission: 08/30/2019   Date of discharge: 08/31/2019      Discharge Diagnoses:  Principal Problem:   Chest pain Active Problems:   Hypothyroidism   Hyperlipidemia   Essential hypertension   S/P primary angioplasty with coronary stent   Admitted From: home Disposition:  Home   Recommendations for Outpatient Follow-up:  1. PCP: Follow-up in 1 week 2. Follow up LABS/TEST: None  Follow-up Information    Lawerance Cruel, MD. Schedule an appointment as soon as possible for a visit in 1 week(s).   Specialty: Family Medicine Contact information: [id number] [place] [address] [phone]        [facility] Follow up.   Specialty: Home Health Services Why: HHPT arranged - they will call you to set up home visits         Diet recommendation: Cardiac diet  Activity: The patient is advised to gradually reintroduce usual activities, as tolerated  Discharge Condition: stable  Code Status: Full code   History of present illness: As per the H and P dictated on admission, "[name] is a 74 y.o. female with history of CAD status post stenting last 1 in 2015, hypertension, hyperlipidemia who has had a left eye cataract surgery on August 29, 2019.  Following which patient came home and started feeling weak weakness persisted and started feeling dizzy and having palpitation whenever she walks.  Last evening around 5 PM patient started also developing chest pressure retrosternal at this point patient called EMS.  EMS arrived and gave aspirin and nitroglycerin chest pain resolved.  Patient was brought to the ER.  ED Course: In the ER EKG was showing normal sinus rhythm chest x-ray unremarkable cardiac markers were negative.  Covid test was negative.  Patient on trying to ambulate was again feeling dizzy with palpitation.  Patient  admitted for further management.  Labs show potassium 3.4 hemoglobin 10.8 which is a drop of around 2 g from 2019.  Patient admitted for further management of chest pain and near syncopal episodes."  Hospital Course:  Summary of her active problems in the hospital is as following.   1. Chest pain with history of CAD status post stenting will admit for further observation continue with patient's beta-blocker aspirin and statins.  Consult cardiology for further recommendation.  Pain does not appear to be cardiac in nature.  Troponins are negative x4.  No further work-up by cardiology.  Outpatient follow-up with PCP. 2. Near syncopal episode with patient walking patient gets dizzy with palpitations.    Oxygen vitals are negative. On ambulation patient was feeling okay.  Ready for discharge. 3. Hypothyroidism on Synthroid.  Check TSH. 4. Hypertension on ARB hydrochlorothiazide and metoprolol. 5. Hypokalemia could be from hydrochlorothiazide replace recheck. 6. Recent left eye surgery for cataract continue eyedrops.  Patient was ambulatory without any assistance. On the day of the discharge the patient's vitals were stable, and no other acute medical condition were reported by patient. the patient was felt safe to be discharge at Home with no therapy needed on discharge.  Consultants: Cardiology  Procedures: none  Discharge Exam: General: Appear in no distress, no Rash; Oral Mucosa Clear, moist. Cardiovascular: S1 and S2 Present, no Murmur, Respiratory: normal respiratory effort, Bilateral Air entry present and no Crackles, no wheezes Abdomen: Bowel Sound present, Soft and no tenderness, no hernia Extremities: no Pedal  edema, no calf tenderness Neurology: alert and oriented to time, place, and person affect appropriate.  Filed Weights   08/31/19 0045  Weight: 61.5 kg   Vitals:   08/31/19 1100 08/31/19 1155  BP: (!) 187/70 (!) 189/79  Pulse: 70   Resp: 18 12  Temp: 98.1 F (36.7 C)     SpO2: 100%     DISCHARGE MEDICATION: Allergies as of 08/31/2019      Reactions   Atorvastatin    REACTION: myaligia   Codeine Hives, Rash   Flagyl [metronidazole Hcl] Anaphylaxis   Metronidazole Nausea And Vomiting, Rash, Other (See Comments)   Chest pain   Oseltamivir Phosphate Itching   Prednisone Palpitations   Makes my heart race   Simvastatin Other (See Comments)   REACTION: myalgia   Sulfa Antibiotics Other (See Comments), Rash   Jittery   Tamiflu [oseltamivir] Itching   Penicillins Nausea And Vomiting, Rash   Did it involve swelling of the face/tongue/throat, SOB, or low BP? No Did it involve sudden or severe rash/hives, skin peeling, or any reaction on the inside of your mouth or nose? Yes Did you need to seek medical attention at a hospital or doctor's office? Yes When did it last happen?74 years old  If all above answers are "NO", may proceed with cephalosporin use.      Medication List    TAKE these medications   Aspirin 81 81 MG EC tablet Generic drug: aspirin Take 81 mg by mouth daily.   Caltrate 600 1500 (600 Ca) MG Tabs tablet Generic drug: calcium carbonate Take 600 mg of elemental calcium by mouth 2 (two) times daily.   levothyroxine 50 MCG tablet Commonly known as: SYNTHROID Take 50 mcg by mouth daily before breakfast.   loteprednol 0.5 % ophthalmic suspension Commonly known as: LOTEMAX Place 1 drop into the left eye in the morning, at noon, in the evening, and at bedtime.   metoprolol tartrate 25 MG tablet Commonly known as: LOPRESSOR Take 0.5 tablets (12.5 mg total) by mouth 2 (two) times daily.   moxifloxacin 0.5 % ophthalmic solution Commonly known as: VIGAMOX Place 1 drop into the left eye in the morning, at noon, in the evening, and at bedtime.   Multivital tablet Take 1 tablet by mouth daily.   NexIUM 40 MG capsule Generic drug: esomeprazole Take 40 mg by mouth daily.   nitroGLYCERIN 0.4 MG SL tablet Commonly known as:  NITROSTAT DISSOLVE ONE TABLET UNDER TONGUE AS NEEDED FOR CHEST PAIN EVERY 5 MINUTES FOR 3 DOSES What changed:   how much to take  how to take this  when to take this  reasons to take this  additional instructions   ondansetron 4 MG disintegrating tablet Commonly known as: Zofran ODT Take 1 tablet (4 mg total) by mouth every 8 (eight) hours as needed for nausea or vomiting.   ondansetron 4 MG disintegrating tablet Commonly known as: Zofran ODT Take 1 tablet (4 mg total) by mouth every 8 (eight) hours as needed.   rosuvastatin 20 MG tablet Commonly known as: CRESTOR Take 1 tablet (20 mg total) by mouth at bedtime.   valsartan-hydrochlorothiazide 160-12.5 MG tablet Commonly known as: Diovan HCT Take 1 tablet by mouth daily.      Allergies  Allergen Reactions  . Atorvastatin     REACTION: myaligia  . Codeine Hives and Rash  . Flagyl [Metronidazole Hcl] Anaphylaxis  . Metronidazole Nausea And Vomiting, Rash and Other (See Comments)    Chest pain   .  Oseltamivir Phosphate Itching  . Prednisone Palpitations    Makes my heart race  . Simvastatin Other (See Comments)    REACTION: myalgia  . Sulfa Antibiotics Other (See Comments) and Rash    Jittery  . Tamiflu [Oseltamivir] Itching  . Penicillins Nausea And Vomiting and Rash    Did it involve swelling of the face/tongue/throat, SOB, or low BP? No Did it involve sudden or severe rash/hives, skin peeling, or any reaction on the inside of your mouth or nose? Yes Did you need to seek medical attention at a hospital or doctor's office? Yes When did it last happen?74 years old  If all above answers are "NO", may proceed with cephalosporin use.   Discharge Instructions    Diet - low sodium heart healthy   Complete by: As directed    Increase activity slowly   Complete by: As directed       The results of significant diagnostics from this hospitalization (including imaging, microbiology, ancillary and laboratory) are  listed below for reference.    Significant Diagnostic Studies: DG Chest 2 View  Result Date: 08/30/2019 CLINICAL DATA:  Chest pain EXAM: CHEST - 2 VIEW COMPARISON:  April 07, 2018 FINDINGS: The heart size and mediastinal contours are within normal limits. Aortic knob calcifications. Both lungs are clear. No acute osseous abnormality. IMPRESSION: No active cardiopulmonary disease. Electronically Signed   By: Prudencio Pair M.D.   On: 08/30/2019 20:03    Microbiology: Recent Results (from the past 240 hour(s))  SARS CORONAVIRUS 2 (TAT 6-24 HRS) Nasopharyngeal Nasopharyngeal Swab     Status: None   Collection Time: 08/31/19 12:01 AM   Specimen: Nasopharyngeal Swab  Result Value Ref Range Status   SARS Coronavirus 2 NEGATIVE NEGATIVE Final    Comment: (NOTE) SARS-CoV-2 target nucleic acids are NOT DETECTED. The SARS-CoV-2 RNA is generally detectable in upper and lower respiratory specimens during the acute phase of infection. Negative results do not preclude SARS-CoV-2 infection, do not rule out co-infections with other pathogens, and should not be used as the sole basis for treatment or other patient management decisions. Negative results must be combined with clinical observations, patient history, and epidemiological information. The expected result is Negative. Fact Sheet for Patients: SugarRoll.be Fact Sheet for Healthcare Providers: https://www.woods-mathews.com/ This test is not yet approved or cleared by the [place] FDA and  has been authorized for detection and/or diagnosis of SARS-CoV-2 by FDA under an Emergency Use Authorization (EUA). This EUA will remain  in effect (meaning this test can be used) for the duration of the COVID-19 declaration under Section 56 4(b)(1) of the Act, 21 U.S.C. section 360bbb-3(b)(1), unless the authorization is terminated or revoked sooner. Performed at [facility], [place] [address]  [place], [place] [address]      Labs: CBC: Recent Labs  Lab 08/30/19 1928 08/31/19 0705  WBC 7.1 8.9  NEUTROABS  --  5.7  HGB 10.8* 10.6*  HCT 35.7* 34.9*  MCV 86.0 84.7  PLT 266 [id number]   Basic Metabolic Panel: Recent Labs  Lab 08/30/19 1928 08/31/19 0705  NA 137 141  K 3.4* 3.5  CL 101 103  CO2 27 28  GLUCOSE 115* 97  BUN 14 9  CREATININE 0.80 0.83  CALCIUM 9.1 9.3   Liver Function Tests: Recent Labs  Lab 08/31/19 0705  AST 27  ALT 21  ALKPHOS 66  BILITOT 1.3*  PROT 6.0*  ALBUMIN 3.4*   No results for input(s): LIPASE, AMYLASE in the  last 168 hours. No results for input(s): AMMONIA in the last 168 hours. Cardiac Enzymes: No results for input(s): CKTOTAL, CKMB, CKMBINDEX, TROPONINI in the last 168 hours. BNP (last 3 results) No results for input(s): BNP in the last 8760 hours. CBG: No results for input(s): GLUCAP in the last 168 hours.  Time spent: 35 minutes  Signed:  Berle Mull  [facility] 08/31/2019 4:50 PM

## 2019-09-05 DIAGNOSIS — I10 Essential (primary) hypertension: Secondary | ICD-10-CM | POA: Diagnosis not present

## 2019-09-05 DIAGNOSIS — M810 Age-related osteoporosis without current pathological fracture: Secondary | ICD-10-CM | POA: Diagnosis not present

## 2019-09-05 DIAGNOSIS — I25119 Atherosclerotic heart disease of native coronary artery with unspecified angina pectoris: Secondary | ICD-10-CM | POA: Diagnosis not present

## 2019-09-05 DIAGNOSIS — E039 Hypothyroidism, unspecified: Secondary | ICD-10-CM | POA: Diagnosis not present

## 2019-09-05 DIAGNOSIS — E785 Hyperlipidemia, unspecified: Secondary | ICD-10-CM | POA: Diagnosis not present

## 2019-09-05 DIAGNOSIS — R42 Dizziness and giddiness: Secondary | ICD-10-CM | POA: Diagnosis not present

## 2019-09-06 DIAGNOSIS — E039 Hypothyroidism, unspecified: Secondary | ICD-10-CM | POA: Diagnosis not present

## 2019-09-06 DIAGNOSIS — I25110 Atherosclerotic heart disease of native coronary artery with unstable angina pectoris: Secondary | ICD-10-CM | POA: Diagnosis not present

## 2019-09-06 DIAGNOSIS — I10 Essential (primary) hypertension: Secondary | ICD-10-CM | POA: Diagnosis not present

## 2019-09-06 DIAGNOSIS — D649 Anemia, unspecified: Secondary | ICD-10-CM | POA: Diagnosis not present

## 2019-09-06 DIAGNOSIS — E876 Hypokalemia: Secondary | ICD-10-CM | POA: Diagnosis not present

## 2019-09-06 DIAGNOSIS — K219 Gastro-esophageal reflux disease without esophagitis: Secondary | ICD-10-CM | POA: Diagnosis not present

## 2019-09-06 DIAGNOSIS — J309 Allergic rhinitis, unspecified: Secondary | ICD-10-CM | POA: Diagnosis not present

## 2019-09-06 DIAGNOSIS — E7800 Pure hypercholesterolemia, unspecified: Secondary | ICD-10-CM | POA: Diagnosis not present

## 2019-09-17 ENCOUNTER — Ambulatory Visit (HOSPITAL_COMMUNITY): Payer: Medicare Other

## 2019-09-18 DIAGNOSIS — I25119 Atherosclerotic heart disease of native coronary artery with unspecified angina pectoris: Secondary | ICD-10-CM | POA: Diagnosis not present

## 2019-09-18 DIAGNOSIS — E039 Hypothyroidism, unspecified: Secondary | ICD-10-CM | POA: Diagnosis not present

## 2019-09-18 DIAGNOSIS — R42 Dizziness and giddiness: Secondary | ICD-10-CM | POA: Diagnosis not present

## 2019-09-18 DIAGNOSIS — I10 Essential (primary) hypertension: Secondary | ICD-10-CM | POA: Diagnosis not present

## 2019-09-18 DIAGNOSIS — E785 Hyperlipidemia, unspecified: Secondary | ICD-10-CM | POA: Diagnosis not present

## 2019-09-18 DIAGNOSIS — M810 Age-related osteoporosis without current pathological fracture: Secondary | ICD-10-CM | POA: Diagnosis not present

## 2019-11-15 DIAGNOSIS — J019 Acute sinusitis, unspecified: Secondary | ICD-10-CM | POA: Diagnosis not present

## 2019-12-05 DIAGNOSIS — H25811 Combined forms of age-related cataract, right eye: Secondary | ICD-10-CM | POA: Diagnosis not present

## 2019-12-05 DIAGNOSIS — H2511 Age-related nuclear cataract, right eye: Secondary | ICD-10-CM | POA: Diagnosis not present

## 2020-02-01 DIAGNOSIS — R29810 Facial weakness: Secondary | ICD-10-CM | POA: Diagnosis not present

## 2020-02-01 DIAGNOSIS — Z883 Allergy status to other anti-infective agents status: Secondary | ICD-10-CM | POA: Diagnosis not present

## 2020-02-01 DIAGNOSIS — R42 Dizziness and giddiness: Secondary | ICD-10-CM | POA: Diagnosis not present

## 2020-02-01 DIAGNOSIS — I10 Essential (primary) hypertension: Secondary | ICD-10-CM | POA: Diagnosis not present

## 2020-02-01 DIAGNOSIS — Z79899 Other long term (current) drug therapy: Secondary | ICD-10-CM | POA: Diagnosis not present

## 2020-02-01 DIAGNOSIS — K219 Gastro-esophageal reflux disease without esophagitis: Secondary | ICD-10-CM | POA: Diagnosis not present

## 2020-02-01 DIAGNOSIS — Z9861 Coronary angioplasty status: Secondary | ICD-10-CM | POA: Diagnosis not present

## 2020-02-01 DIAGNOSIS — M810 Age-related osteoporosis without current pathological fracture: Secondary | ICD-10-CM | POA: Diagnosis not present

## 2020-02-01 DIAGNOSIS — Z882 Allergy status to sulfonamides status: Secondary | ICD-10-CM | POA: Diagnosis not present

## 2020-02-01 DIAGNOSIS — R609 Edema, unspecified: Secondary | ICD-10-CM | POA: Diagnosis not present

## 2020-02-01 DIAGNOSIS — Z885 Allergy status to narcotic agent status: Secondary | ICD-10-CM | POA: Diagnosis not present

## 2020-02-01 DIAGNOSIS — E079 Disorder of thyroid, unspecified: Secondary | ICD-10-CM | POA: Diagnosis not present

## 2020-02-01 DIAGNOSIS — N390 Urinary tract infection, site not specified: Secondary | ICD-10-CM | POA: Diagnosis not present

## 2020-02-01 DIAGNOSIS — R531 Weakness: Secondary | ICD-10-CM | POA: Diagnosis not present

## 2020-02-01 DIAGNOSIS — Z880 Allergy status to penicillin: Secondary | ICD-10-CM | POA: Diagnosis not present

## 2020-02-01 DIAGNOSIS — Z7982 Long term (current) use of aspirin: Secondary | ICD-10-CM | POA: Diagnosis not present

## 2020-02-01 DIAGNOSIS — Z888 Allergy status to other drugs, medicaments and biological substances status: Secondary | ICD-10-CM | POA: Diagnosis not present

## 2020-02-05 ENCOUNTER — Ambulatory Visit (HOSPITAL_COMMUNITY)
Admission: RE | Admit: 2020-02-05 | Payer: Medicare Other | Source: Ambulatory Visit | Attending: Internal Medicine | Admitting: Internal Medicine

## 2020-02-07 ENCOUNTER — Emergency Department (HOSPITAL_BASED_OUTPATIENT_CLINIC_OR_DEPARTMENT_OTHER)
Admission: EM | Admit: 2020-02-07 | Discharge: 2020-02-07 | Disposition: A | Discharge status: Home / Self Care | Payer: Medicare Other | Attending: Emergency Medicine | Admitting: Emergency Medicine

## 2020-02-07 ENCOUNTER — Other Ambulatory Visit: Payer: Self-pay

## 2020-02-07 ENCOUNTER — Emergency Department (HOSPITAL_BASED_OUTPATIENT_CLINIC_OR_DEPARTMENT_OTHER): Payer: Medicare Other

## 2020-02-07 ENCOUNTER — Encounter (HOSPITAL_BASED_OUTPATIENT_CLINIC_OR_DEPARTMENT_OTHER): Payer: Self-pay | Admitting: Emergency Medicine

## 2020-02-07 DIAGNOSIS — E039 Hypothyroidism, unspecified: Secondary | ICD-10-CM | POA: Insufficient documentation

## 2020-02-07 DIAGNOSIS — I2510 Atherosclerotic heart disease of native coronary artery without angina pectoris: Secondary | ICD-10-CM | POA: Insufficient documentation

## 2020-02-07 DIAGNOSIS — I10 Essential (primary) hypertension: Secondary | ICD-10-CM | POA: Diagnosis not present

## 2020-02-07 DIAGNOSIS — R531 Weakness: Secondary | ICD-10-CM | POA: Diagnosis not present

## 2020-02-07 DIAGNOSIS — Z79899 Other long term (current) drug therapy: Secondary | ICD-10-CM | POA: Insufficient documentation

## 2020-02-07 DIAGNOSIS — R5383 Other fatigue: Secondary | ICD-10-CM | POA: Diagnosis not present

## 2020-02-07 DIAGNOSIS — R42 Dizziness and giddiness: Secondary | ICD-10-CM | POA: Diagnosis not present

## 2020-02-07 LAB — CBC
HCT: 39 % (ref 36.0–46.0)
Hemoglobin: 12.1 g/dL (ref 12.0–15.0)
MCH: 26 pg (ref 26.0–34.0)
MCHC: 31 g/dL (ref 30.0–36.0)
MCV: 83.7 fL (ref 80.0–100.0)
Platelets: 331 10*3/uL (ref 150–400)
RBC: 4.66 MIL/uL (ref 3.87–5.11)
RDW: 14.7 % (ref 11.5–15.5)
WBC: 9.7 10*3/uL (ref 4.0–10.5)
nRBC: 0 % (ref 0.0–0.2)

## 2020-02-07 LAB — URINALYSIS, ROUTINE W REFLEX MICROSCOPIC
Bilirubin Urine: NEGATIVE
Glucose, UA: NEGATIVE mg/dL
Ketones, ur: NEGATIVE mg/dL
Nitrite: NEGATIVE
Protein, ur: NEGATIVE mg/dL
Specific Gravity, Urine: 1.025 (ref 1.005–1.030)
pH: 5.5 (ref 5.0–8.0)

## 2020-02-07 LAB — BASIC METABOLIC PANEL
Anion gap: 12 (ref 5–15)
BUN: 18 mg/dL (ref 8–23)
CO2: 28 mmol/L (ref 22–32)
Calcium: 9.7 mg/dL (ref 8.9–10.3)
Chloride: 99 mmol/L (ref 98–111)
Creatinine, Ser: 0.83 mg/dL (ref 0.44–1.00)
GFR calc Af Amer: >60 mL/min (ref >60)
GFR calc non Af Amer: >60 mL/min (ref >60)
Glucose, Bld: 104 mg/dL — ABNORMAL HIGH (ref 70–99)
Potassium: 3.6 mmol/L (ref 3.5–5.1)
Sodium: 139 mmol/L (ref 135–145)

## 2020-02-07 LAB — URINALYSIS, MICROSCOPIC (REFLEX)

## 2020-02-07 LAB — TROPONIN I (HIGH SENSITIVITY): Troponin I (High Sensitivity): 5 ng/L (ref <18)

## 2020-02-07 LAB — D-DIMER, QUANTITATIVE: D-Dimer, Quant: 0.34 ug/mL-FEU (ref 0.00–0.50)

## 2020-02-07 NOTE — ED Triage Notes (Signed)
Pt c/o weakness and palpitations x 1 week. Currently being treated for a UTI.

## 2020-02-07 NOTE — ED Notes (Signed)
Per pt she has been seen x 3 for a UTI and  Given 3 different antiobiotics the last one she has not picked up yet, pt states she  Has felt weak dizzy and had some palpitations,  And on occ has been syncopal , pt has not been vaccinated

## 2020-02-07 NOTE — ED Provider Notes (Signed)
[facility] EMERGENCY DEPARTMENT Provider Note   CSN: [id number] Arrival date & time: 02/07/20  1732     History Chief Complaint  Patient presents with  . Weakness    [name] is a 74 y.o. female.  74 yo F with a cc of fatigue.  Going on for about 2 weeks.  Seen at an outside ED and PCP office prior to this.  Thought to be UTI.  Tried two antibiotics without change.  No urinary symptoms.  No abdominal pain.  No back pain.  No fevers.  No cough.  No chest pain or shortness of breath.   The history is provided by the patient.  Weakness Severity:  Moderate Onset quality:  Gradual Duration:  2 weeks Timing:  Constant Progression:  Worsening Chronicity:  New Relieved by:  Nothing Worsened by:  Nothing Ineffective treatments:  None tried Associated symptoms: no arthralgias, no chest pain, no dizziness, no dysuria, no fever, no headaches, no myalgias, no nausea, no shortness of breath, no urgency and no vomiting        Past Medical History:  Diagnosis Date  . Anginal pain ([place])   . Carotid artery disease ([place])    a. Carotid US 9/17: R 1-39%; L 40-59% >> FU 1 year  . Coronary atherosclerosis of native coronary artery    a. DES to LCx in 07/2010 b. DES to RCA in 04/2013  . Exertional shortness of breath   . Heart murmur   . Hiatal hernia   . History of blood transfusion    "after daughter was born" (05/22/2013)  . HLD (hyperlipidemia)   . HTN (hypertension)   . Hypothyroidism   . Osteoporosis   . Palpitations   . Premature ventricular contractions   . Recurrent UTI    "q 4 months for the last couple years; last one was 12/2012" (05/22/2013)  . Sinus headache   . Tremors of nervous system    by dr. love    Patient Active Problem List   Diagnosis Date Noted  . Abnormal EKG 10/19/2016  . Carotid artery disease ([place])   . Pain in the chest 02/25/2016  . Chest pain 01/27/2015  . S/P primary angioplasty with coronary stent 05/24/2013  . Hypokalemia  05/24/2013  . Hypothyroidism 08/14/2010  . Hyperlipidemia 08/14/2010  . Essential hypertension 08/14/2010  . CAD, NATIVE VESSEL 08/14/2010  . PREMATURE VENTRICULAR CONTRACTIONS 08/14/2010  . HIATAL HERNIA 08/14/2010    Past Surgical History:  Procedure Laterality Date  . APPENDECTOMY    . BREAST CYST ASPIRATION Bilateral    "numerous in the dr's office" (05/22/2013)  . BREAST CYST EXCISION Right 1965  . BREAST LUMPECTOMY Right 1980's   "benign" (05/22/2013)  . CARDIAC CATHETERIZATION  ~ 1968; 08/2010  . CORONARY ANGIOPLASTY WITH STENT PLACEMENT  07/2010  . CORONARY ANGIOPLASTY WITH STENT PLACEMENT  05/23/2013   Patent LCx stent (10-20% ISR), 70% mid RCA (FFR 0.73) s/p DES, 20% prox RCA, 40% ostial D1, 60% distal posterior AV branch; EF 65-70%  . ECTOPIC PREGNANCY SURGERY    . LAPAROTOMY  1965   "ruptured blood vessel in my side during childbirth; didn't find it til 2 days later; had to open me up to repair it" (05/22/2013)  . LEFT HEART CATHETERIZATION WITH CORONARY ANGIOGRAM N/A 05/23/2013   Procedure: LEFT HEART CATHETERIZATION WITH CORONARY ANGIOGRAM;  Surgeon: Wellington Hampshire, MD;  Location: [facility] CATH LAB;  Service: Cardiovascular;  Laterality: N/A;  . LEFT HEART CATHETERIZATION WITH CORONARY  ANGIOGRAM N/A 01/28/2014   Procedure: LEFT HEART CATHETERIZATION WITH CORONARY ANGIOGRAM;  Surgeon: Blane Ohara, MD;  Location: [facility] CATH LAB;  Service: Cardiovascular;  Laterality: N/A;  . TONSILLECTOMY    . TOTAL ABDOMINAL HYSTERECTOMY       OB History    Gravida  3   Para  2   Term      Preterm      AB      Living  2     SAB      TAB      Ectopic      Multiple      Live Births              Family History  Problem Relation Age of Onset  . CAD Mother   . Lymphoma Mother   . Heart disease Father   . Heart attack Brother 44  . Stroke Other     Social History   Tobacco Use  . Smoking status: Never Smoker  . Smokeless tobacco: Never Used  Substance Use  Topics  . Alcohol use: No  . Drug use: No    Home Medications Prior to Admission medications   Medication Sig Start Date End Date Taking? Authorizing Provider  aspirin (ASPIR-81) 81 MG EC tablet Take 81 mg by mouth daily.      [provider]  Calcium Carbonate (CALTRATE 600) 1500 MG TABS Take 600 mg of elemental calcium by mouth 2 (two) times daily.     [provider]  esomeprazole (NEXIUM) 40 MG capsule Take 40 mg by mouth daily.      [provider]  levothyroxine (SYNTHROID, LEVOTHROID) 50 MCG tablet Take 50 mcg by mouth daily before breakfast.    [provider]  loteprednol (LOTEMAX) 0.5 % ophthalmic suspension Place 1 drop into the left eye in the morning, at noon, in the evening, and at bedtime.    [provider]  metoprolol tartrate (LOPRESSOR) 25 MG tablet Take 0.5 tablets (12.5 mg total) by mouth 2 (two) times daily. 04/19/19   Fay Records, MD  moxifloxacin (VIGAMOX) 0.5 % ophthalmic solution Place 1 drop into the left eye in the morning, at noon, in the evening, and at bedtime.    [provider]  Multiple Vitamins-Minerals (MULTIVITAL) tablet Take 1 tablet by mouth daily.      [provider]  nitroGLYCERIN (NITROSTAT) 0.4 MG SL tablet DISSOLVE ONE TABLET UNDER TONGUE AS NEEDED FOR CHEST PAIN EVERY 5 MINUTES FOR 3 DOSES Patient taking differently: Place 0.4 mg under the tongue every 5 (five) minutes as needed for chest pain.  04/19/19   Fay Records, MD  ondansetron (ZOFRAN ODT) 4 MG disintegrating tablet Take 1 tablet (4 mg total) by mouth every 8 (eight) hours as needed for nausea or vomiting. Patient not taking: Reported on 04/07/2018 03/28/18   Gareth Morgan, MD  ondansetron (ZOFRAN ODT) 4 MG disintegrating tablet Take 1 tablet (4 mg total) by mouth every 8 (eight) hours as needed. Patient not taking: Reported on 08/30/2019 04/09/18   Fredia Sorrow, MD  rosuvastatin (CRESTOR) 20 MG tablet Take 1 tablet (20 mg  total) by mouth at bedtime. 04/19/19   Fay Records, MD  valsartan-hydrochlorothiazide (DIOVAN HCT) 160-12.5 MG tablet Take 1 tablet by mouth daily. 04/19/19   Fay Records, MD    Allergies    Atorvastatin, Codeine, Flagyl [metronidazole hcl], Metronidazole, Oseltamivir phosphate, Prednisone, Simvastatin, Sulfa antibiotics, Tamiflu [oseltamivir], and Penicillins  Review of Systems   Review of Systems  Constitutional: Positive for fatigue. Negative for chills and fever.  HENT: Negative for congestion and rhinorrhea.   Eyes: Negative for redness and visual disturbance.  Respiratory: Negative for shortness of breath and wheezing.   Cardiovascular: Negative for chest pain and palpitations.  Gastrointestinal: Negative for nausea and vomiting.  Genitourinary: Negative for dysuria and urgency.  Musculoskeletal: Negative for arthralgias and myalgias.  Skin: Negative for pallor and wound.  Neurological: Positive for weakness. Negative for dizziness and headaches.    Physical Exam Updated Vital Signs BP (!) 164/72   Pulse 69   Temp 97.9 F (36.6 C) (Oral)   Resp 16   Ht 5\' 2"  (1.575 m)   Wt 63 kg   SpO2 97%   BMI 25.42 kg/m   Physical Exam Vitals and nursing note reviewed.  Constitutional:      General: She is not in acute distress.    Appearance: She is well-developed. She is not diaphoretic.     Comments: pale  HENT:     Head: Normocephalic and atraumatic.  Eyes:     Pupils: Pupils are equal, round, and reactive to light.  Cardiovascular:     Rate and Rhythm: Normal rate and regular rhythm.     Heart sounds: No murmur heard.  No friction rub. No gallop.   Pulmonary:     Effort: Pulmonary effort is normal.     Breath sounds: No wheezing or rales.  Abdominal:     General: There is no distension.     Palpations: Abdomen is soft.     Tenderness: There is no abdominal tenderness.  Musculoskeletal:        General: No tenderness.     Cervical back: Normal range of motion and  neck supple.  Skin:    General: Skin is warm and dry.  Neurological:     Mental Status: She is alert and oriented to person, place, and time.  Psychiatric:        Behavior: Behavior normal.     ED Results / Procedures / Treatments   Labs (all labs ordered are listed, but only abnormal results are displayed) Labs Reviewed  BASIC METABOLIC PANEL - Abnormal; Notable for the following components:      Result Value   Glucose, Bld 104 (*)    All other components within normal limits  URINALYSIS, ROUTINE W REFLEX MICROSCOPIC - Abnormal; Notable for the following components:   Hgb urine dipstick SMALL (*)    Leukocytes,Ua SMALL (*)    All other components within normal limits  URINALYSIS, MICROSCOPIC (REFLEX) - Abnormal; Notable for the following components:   Bacteria, UA MANY (*)    All other components within normal limits  CULTURE, BLOOD (ROUTINE X 2)  CULTURE, BLOOD (ROUTINE X 2)  URINE CULTURE  CBC  D-DIMER, QUANTITATIVE (NOT AT [facility])  TROPONIN I (HIGH SENSITIVITY)    EKG EKG Interpretation  Date/Time:  Thursday February 07 2020 17:45:06 EDT Ventricular Rate:  79 PR Interval:  152 QRS Duration: 80 QT Interval:  392 QTC Calculation: 449 R Axis:   31 Text Interpretation: Normal sinus rhythm Normal ECG background noise in v2 V6 Otherwise no significant change Confirmed by Deno Etienne [phone]) on 02/07/2020 5:51:05 PM   Radiology DG Chest 2 View  Result Date: 02/07/2020 CLINICAL DATA:  Fatigue, urinary tract infection, weakness, dizziness EXAM: CHEST - 2 VIEW COMPARISON:  08/30/2019 FINDINGS: Frontal and lateral views of the chest demonstrate an unremarkable  cardiac silhouette. No airspace disease, effusion, or pneumothorax. No acute bony abnormalities. IMPRESSION: 1. Stable exam, no acute process. Electronically Signed   By: Randa Ngo M.D.   On: 02/07/2020 19:20    Procedures Procedures (including critical care time)  Medications Ordered in ED Medications - No data to  display  ED Course  I have reviewed the triage vital signs and the nursing notes.  Pertinent labs & imaging results that were available during my care of the patient were reviewed by me and considered in my medical decision making (see chart for details).    MDM Rules/Calculators/A&P                          74 yo F with a cc of fatigue.  Going on for a couple weeks.  Pan negative ROS.  No abdominal pain.  Clear lungs.  Will obtain lab work, cxr.  Deberah Castle.   Work-up largely unremarkable.  D-dimer is negative no significant electrolyte abnormality no significant anemia.  Chest x-ray viewed by me without focal.  Or pneumothorax.  Troponin negative.  Discussed the results with the patient.  Her UA also I do not think is consistent with urinary tract infection.  She had trace leukocyte esterase on her dipstick and then was reported as TNTC whites.   Patient will continue her Keflex at home.  We will follow-up with her family doctor in the office.  10:03 PM:  I have discussed the diagnosis/risks/treatment options with the patient and believe the pt to be eligible for discharge home to follow-up with PCP. We also discussed returning to the ED immediately if new or worsening sx occur. We discussed the sx which are most concerning (e.g., sudden worsening pain, fever, inability to tolerate by mouth) that necessitate immediate return. Medications administered to the patient during their visit and any new prescriptions provided to the patient are listed below.  Medications given during this visit Medications - No data to display   The patient appears reasonably screen and/or stabilized for discharge and I doubt any other medical condition or other [facility] requiring further screening, evaluation, or treatment in the ED at this time prior to discharge.   Final Clinical Impression(s) / ED Diagnoses Final diagnoses:  Fatigue, unspecified type    Rx / DC Orders ED Discharge Orders    None         Deno Etienne, DO 02/07/20 2203

## 2020-02-07 NOTE — Discharge Instructions (Signed)
Follow up with your family doc.  Eat and drink well for the next couple days.  Return for worsening symptoms.

## 2020-02-09 LAB — URINE CULTURE: Culture: <10000 — AB

## 2020-02-12 LAB — CULTURE, BLOOD (ROUTINE X 2): Culture: NO GROWTH

## 2020-03-10 DIAGNOSIS — N390 Urinary tract infection, site not specified: Secondary | ICD-10-CM | POA: Diagnosis not present

## 2020-03-18 ENCOUNTER — Ambulatory Visit (HOSPITAL_COMMUNITY): Admission: RE | Admit: 2020-03-18 | Payer: Medicare Other | Source: Ambulatory Visit

## 2020-04-17 ENCOUNTER — Other Ambulatory Visit: Payer: Self-pay | Admitting: Internal Medicine

## 2020-04-17 DIAGNOSIS — M791 Myalgia, unspecified site: Secondary | ICD-10-CM | POA: Diagnosis not present

## 2020-04-28 ENCOUNTER — Other Ambulatory Visit: Payer: Self-pay | Admitting: Internal Medicine

## 2020-04-30 ENCOUNTER — Ambulatory Visit (HOSPITAL_COMMUNITY)
Admission: RE | Admit: 2020-04-30 | Payer: Medicare Other | Source: Ambulatory Visit | Attending: Internal Medicine | Admitting: Internal Medicine

## 2020-05-13 ENCOUNTER — Other Ambulatory Visit: Payer: Self-pay | Admitting: Internal Medicine

## 2020-05-26 ENCOUNTER — Encounter (HOSPITAL_COMMUNITY): Payer: Medicare Other

## 2020-05-27 ENCOUNTER — Other Ambulatory Visit: Payer: Self-pay

## 2020-05-27 ENCOUNTER — Other Ambulatory Visit (HOSPITAL_COMMUNITY): Payer: Self-pay | Admitting: Internal Medicine

## 2020-05-27 ENCOUNTER — Ambulatory Visit (HOSPITAL_COMMUNITY)
Admission: RE | Admit: 2020-05-27 | Discharge: 2020-05-27 | Disposition: A | Discharge status: Home / Self Care | Payer: Medicare Other | Source: Ambulatory Visit | Attending: Cardiovascular Disease | Admitting: Cardiovascular Disease

## 2020-05-27 DIAGNOSIS — I6522 Occlusion and stenosis of left carotid artery: Secondary | ICD-10-CM

## 2020-05-27 DIAGNOSIS — I6523 Occlusion and stenosis of bilateral carotid arteries: Secondary | ICD-10-CM | POA: Diagnosis not present

## 2020-05-29 ENCOUNTER — Other Ambulatory Visit: Payer: Self-pay | Admitting: *Deleted

## 2020-05-29 MED ORDER — ROSUVASTATIN CALCIUM 20 MG PO TABS: 20.0000 mg | ORAL_TABLET | Freq: Every day | ORAL | 1 refills | Status: DC

## 2020-05-29 MED ORDER — NITROGLYCERIN 0.4 MG SL SUBL: 0.4000 mg | SUBLINGUAL_TABLET | SUBLINGUAL | 6 refills | Status: DC | PRN

## 2020-05-29 MED ORDER — METOPROLOL TARTRATE 25 MG PO TABS: ORAL_TABLET | ORAL | 1 refills | Status: DC

## 2020-06-13 ENCOUNTER — Other Ambulatory Visit: Payer: Self-pay | Admitting: Internal Medicine

## 2020-07-30 ENCOUNTER — Other Ambulatory Visit: Payer: Self-pay | Admitting: Internal Medicine

## 2020-08-15 ENCOUNTER — Ambulatory Visit: Payer: Medicare Other | Admitting: Internal Medicine

## 2020-09-02 ENCOUNTER — Encounter: Payer: Self-pay | Admitting: Internal Medicine

## 2020-09-02 ENCOUNTER — Other Ambulatory Visit: Payer: Self-pay

## 2020-09-02 ENCOUNTER — Telehealth (INDEPENDENT_AMBULATORY_CARE_PROVIDER_SITE_OTHER): Payer: Medicare Other | Admitting: Internal Medicine

## 2020-09-02 VITALS — HR 77 | Ht 62.0 in | Wt 136.0 lb

## 2020-09-02 DIAGNOSIS — I2510 Atherosclerotic heart disease of native coronary artery without angina pectoris: Secondary | ICD-10-CM | POA: Diagnosis not present

## 2020-09-02 MED ORDER — NITROGLYCERIN 0.4 MG SL SUBL: 0.4000 mg | SUBLINGUAL_TABLET | SUBLINGUAL | 6 refills | Status: AC | PRN

## 2020-09-02 MED ORDER — ROSUVASTATIN CALCIUM 20 MG PO TABS: 20.0000 mg | ORAL_TABLET | Freq: Every day | ORAL | 3 refills | Status: DC

## 2020-09-02 MED ORDER — METOPROLOL TARTRATE 25 MG PO TABS: ORAL_TABLET | ORAL | 3 refills | Status: DC

## 2020-09-02 NOTE — Progress Notes (Addendum)
Virtual Visit via Telephone Note   This visit type was conducted due to national recommendations for restrictions regarding the COVID-19 Pandemic (e.g. social distancing) in an effort to limit this patient's exposure and mitigate transmission in our community.  Due to her co-morbid illnesses, this patient is at least at moderate risk for complications without adequate follow up.  This format is felt to be most appropriate for this patient at this time.  The patient did not have access to video technology/had technical difficulties with video requiring transitioning to audio format only (telephone).  All issues noted in this document were discussed and addressed.  No physical exam could be performed with this format.  Please refer to the patient's chart for her  consent to telehealth for [facility].   Date:  09/02/2020   ID:  [name], DOB [birth year], MRN [id number]  Patient Location: Home Provider Location: Home  PCP:  Nicole Cruel, MD  Cardiologist:  No primary care provider on file.  Electrophysiologist:  None   Evaluation Performed:  Follow-Up Visit  Chief Complaint:    History of Present Illness:    [name] is a 75 y.o. female with a history of HTN and CAD   Last cath done in 2015 for CP  It showd patent stent to RCA  50% posterior AVsegment  Minimal dz in LAD LCx (nondominant) with long patent stent.  10 to 20% instent restenosis.   LVEF normal.  The pt also has mild to moderate CV dz   I saw her in July 2019   She says she has been doing good from a cardiac standpoint   Denies CP  Breathing is OK  No dizziness No palpitations        Past Medical History:  Diagnosis Date  . Anginal pain ([place])   . Carotid artery disease ([place])    a. Carotid US 9/17: R 1-39%; L 40-59% >> FU 1 year  . Coronary atherosclerosis of native coronary artery    a. DES to LCx in 07/2010 b. DES to RCA in 04/2013  . Exertional shortness of breath   . Heart murmur   . Hiatal hernia    . History of blood transfusion    "after daughter was born" (05/22/2013)  . HLD (hyperlipidemia)   . HTN (hypertension)   . Hypothyroidism   . Osteoporosis   . Palpitations   . Premature ventricular contractions   . Recurrent UTI    "q 4 months for the last couple years; last one was 12/2012" (05/22/2013)  . Sinus headache   . Tremors of nervous system    by dr. love   Past Surgical History:  Procedure Laterality Date  . APPENDECTOMY    . BREAST CYST ASPIRATION Bilateral    "numerous in the dr's office" (05/22/2013)  . BREAST CYST EXCISION Right 1965  . BREAST LUMPECTOMY Right 1980's   "benign" (05/22/2013)  . CARDIAC CATHETERIZATION  ~ 1968; 08/2010  . CORONARY ANGIOPLASTY WITH STENT PLACEMENT  07/2010  . CORONARY ANGIOPLASTY WITH STENT PLACEMENT  05/23/2013   Patent LCx stent (10-20% ISR), 70% mid RCA (FFR 0.73) s/p DES, 20% prox RCA, 40% ostial D1, 60% distal posterior AV branch; EF 65-70%  . ECTOPIC PREGNANCY SURGERY    . LAPAROTOMY  1965   "ruptured blood vessel in my side during childbirth; didn't find it til 2 days later; had to open me up to repair it" (05/22/2013)  . LEFT HEART CATHETERIZATION WITH CORONARY  ANGIOGRAM N/A 05/23/2013   Procedure: LEFT HEART CATHETERIZATION WITH CORONARY ANGIOGRAM;  Surgeon: Wellington Hampshire, MD;  Location: [facility] CATH LAB;  Service: Cardiovascular;  Laterality: N/A;  . LEFT HEART CATHETERIZATION WITH CORONARY ANGIOGRAM N/A 01/28/2014   Procedure: LEFT HEART CATHETERIZATION WITH CORONARY ANGIOGRAM;  Surgeon: Blane Ohara, MD;  Location: [facility] CATH LAB;  Service: Cardiovascular;  Laterality: N/A;  . TONSILLECTOMY    . TOTAL ABDOMINAL HYSTERECTOMY       Current Meds  Medication Sig  . aspirin 81 MG EC tablet Take 81 mg by mouth daily.  . calcium carbonate (OSCAL) 1500 (600 Ca) MG TABS tablet Take 600 mg of elemental calcium by mouth 2 (two) times daily.  Marland Kitchen esomeprazole (NEXIUM) 40 MG capsule Take 40 mg by mouth daily.  Marland Kitchen levothyroxine  (SYNTHROID, LEVOTHROID) 50 MCG tablet Take 50 mcg by mouth daily before breakfast.  . metoprolol tartrate (LOPRESSOR) 25 MG tablet TAKE 1/2 TABLET(12.5 MG) BY MOUTH TWICE DAILY  . Multiple Vitamins-Minerals (MULTIVITAL) tablet Take 1 tablet by mouth daily.  . nitroGLYCERIN (NITROSTAT) 0.4 MG SL tablet Place 1 tablet (0.4 mg total) under the tongue every 5 (five) minutes as needed for chest pain.  . rosuvastatin (CRESTOR) 20 MG tablet Take 1 tablet (20 mg total) by mouth daily.  . valsartan-hydrochlorothiazide (DIOVAN-HCT) 160-12.5 MG tablet Take 1 tablet by mouth daily. Please make overdue appt with Dr. Harrington Challenger before anymore refills. 1st attempt     Allergies:   Atorvastatin, Codeine, Flagyl [metronidazole hcl], Metronidazole, Oseltamivir phosphate, Prednisone, Simvastatin, Sulfa antibiotics, Tamiflu [oseltamivir], Penicillins, Macrobid [nitrofurantoin], Other, and Penicillin g   Social History   Tobacco Use  . Smoking status: Never Smoker  . Smokeless tobacco: Never Used  Substance Use Topics  . Alcohol use: No  . Drug use: No     Family Hx: The patient's family history includes CAD in her mother; Heart attack (age of onset: 84) in her brother; Heart disease in her father; Lymphoma in her mother; Stroke in an other family member.  ROS:   Please see the history of present illness.    All other systems reviewed and are negative.   Prior CV studies:   The following studies were reviewed today: Carotid USN  Final Interpretation: Right Carotid: Velocities in the right ICA are consistent with a 1-39% stenosis.  Left Carotid: Velocities in the left ICA are consistent with a 40-59% stenosis.  Vertebrals:  Right vertebral artery demonstrates antegrade flow. Left vertebral              artery demonstrates high resistant flow. Subclavians: Normal flow hemodynamics were seen in bilateral subclavian              arteries.  *See table(s) above for measurements and observations. Suggest  follow up study in 12 months.   Electronically signed by Jenkins Rouge MD on 02/10/2018 at 12:37:47 PM. Labs/Other Tests and Data Reviewed:    EKG:  No ECG reviewed.  Recent Labs: 02/07/2020: BUN 18; Creatinine, Ser 0.83; Hemoglobin 12.1; Platelets 331; Potassium 3.6; Sodium 139   Recent Lipid Panel Lab Results  Component Value Date/Time   CHOL 164 10/19/2016 03:53 AM   TRIG 260 (H) 10/19/2016 03:53 AM   HDL 46 10/19/2016 03:53 AM   CHOLHDL 3.6 10/19/2016 03:53 AM   LDLCALC 66 10/19/2016 03:53 AM   LDLDIRECT 121.5 09/28/2010 09:14 AM    Wt Readings from Last 3 Encounters:  09/02/20 136 lb (61.7 kg)  02/07/20 139 lb (63 kg)  08/31/19 135 lb 9.3 oz (61.5 kg)     Objective:    Vital Signs:  Pulse 77   Ht 5\' 2"  (1.575 m)   Wt 136 lb (61.7 kg)   SpO2 98%   BMI 24.87 kg/m    BP not taken    Exam not done as remote f/u (telephone)  ASSESSMENT & PLAN:    1. CAD   Pt remains symptom free   2  CV dz   Patient with mild plaquing in R carotid  Mod in L ICA  F/U in 1 year (Nov 2022)  3  HL   Pt will get seen soon and have labs drawn     4  HTN  BP is not taken   She has f/u soon in person with C Melinda Crutch  Encouraged pt to wak    5  COVID-19 Education: The signs and symptoms of COVID-19 were discussed with the patient and how to seek care for testing (follow up with PCP or arrange E-visit).  The importance of social distancing was discussed today.  Time:   Today, I have spent 15 minutes with the patient with telehealth technology discussing the above problems.     Medication Adjustments/Labs and Tests Ordered: Current medicines are reviewed at length with the patient today.  Concerns regarding medicines are outlined above.   Tests Ordered: No orders of the defined types were placed in this encounter.   Medication Changes: No orders of the defined types were placed in this encounter.   Follow Up:  In Person August 2021  Signed, Dorris Carnes, MD  09/02/2020 10:50  AM    [place]

## 2020-09-02 NOTE — Patient Instructions (Addendum)
Medication Instructions:  No changes *If you need a refill on your cardiac medications before your next appointment, please call your pharmacy*   Lab Work: none If you have labs (blood work) drawn today and your tests are completely normal, you will receive your results only by: Marland Kitchen MyChart Message (if you have MyChart) OR . A paper copy in the mail If you have any lab test that is abnormal or we need to change your treatment, we will call you to review the results.   Testing/Procedures: none   Follow-Up: At [facility], you and your health needs are our priority.  As part of our continuing mission to provide you with exceptional heart care, we have created designated Provider Care Teams.  These Care Teams include your primary Cardiologist (physician) and Advanced Practice Providers (APPs -  Physician Assistants and Nurse Practitioners) who all work together to provide you with the care you need, when you need it.  Your next appointment:   12 month(s)  The format for your next appointment:   In Person  Provider:   You may see Dr. Dorris Carnes or one of the following Advanced Practice Providers on your designated Care Team:    Richardson Dopp, PA-C  Robbie Lis, [place]  Other Instructions Called PCP office and requested labs be faxed to Dr. Harrington Challenger at [phone].

## 2020-10-13 DIAGNOSIS — E559 Vitamin D deficiency, unspecified: Secondary | ICD-10-CM | POA: Diagnosis not present

## 2020-10-13 DIAGNOSIS — Z1389 Encounter for screening for other disorder: Secondary | ICD-10-CM | POA: Diagnosis not present

## 2020-10-13 DIAGNOSIS — Z0000 Encounter for general adult medical examination without abnormal findings: Secondary | ICD-10-CM | POA: Diagnosis not present

## 2020-10-13 DIAGNOSIS — D649 Anemia, unspecified: Secondary | ICD-10-CM | POA: Diagnosis not present

## 2020-10-13 DIAGNOSIS — Z23 Encounter for immunization: Secondary | ICD-10-CM | POA: Diagnosis not present

## 2020-10-13 DIAGNOSIS — I10 Essential (primary) hypertension: Secondary | ICD-10-CM | POA: Diagnosis not present

## 2020-10-13 DIAGNOSIS — E039 Hypothyroidism, unspecified: Secondary | ICD-10-CM | POA: Diagnosis not present

## 2020-10-13 DIAGNOSIS — E7800 Pure hypercholesterolemia, unspecified: Secondary | ICD-10-CM | POA: Diagnosis not present

## 2020-10-16 DIAGNOSIS — E7800 Pure hypercholesterolemia, unspecified: Secondary | ICD-10-CM | POA: Diagnosis not present

## 2020-10-16 DIAGNOSIS — R350 Frequency of micturition: Secondary | ICD-10-CM | POA: Diagnosis not present

## 2020-10-16 DIAGNOSIS — E559 Vitamin D deficiency, unspecified: Secondary | ICD-10-CM | POA: Diagnosis not present

## 2020-10-16 DIAGNOSIS — F419 Anxiety disorder, unspecified: Secondary | ICD-10-CM | POA: Diagnosis not present

## 2020-10-16 DIAGNOSIS — I779 Disorder of arteries and arterioles, unspecified: Secondary | ICD-10-CM | POA: Diagnosis not present

## 2020-10-16 DIAGNOSIS — I10 Essential (primary) hypertension: Secondary | ICD-10-CM | POA: Diagnosis not present

## 2020-10-16 DIAGNOSIS — K219 Gastro-esophageal reflux disease without esophagitis: Secondary | ICD-10-CM | POA: Diagnosis not present

## 2020-10-16 DIAGNOSIS — M810 Age-related osteoporosis without current pathological fracture: Secondary | ICD-10-CM | POA: Diagnosis not present

## 2020-10-16 DIAGNOSIS — E039 Hypothyroidism, unspecified: Secondary | ICD-10-CM | POA: Diagnosis not present

## 2020-10-17 ENCOUNTER — Telehealth: Payer: Self-pay | Admitting: *Deleted

## 2020-10-17 NOTE — Telephone Encounter (Signed)
Called patient to discuss her lab results from PCP and recommendations from Dr. Harrington Challenger  The labs that were sent were outdated. The pt just had recent labs at PCP this week.  Prior labs: LDL was 97 Recom: LDL should be lower w CAD, increase Crestor to 40 mg daily, add Zetia 10 mg daily, repeat lipids, ast in 8 weeks.  I have requested a copy of recent labs from PCP office.  The pt would like to begin with increasing Crestor every other day. If tolerated will increase to 40 mg daily.  She will call after taking it for a month and if tolerating will then begin Zetia. Pt is aware we will plan lipids/ast in 8 weeks.  I adv to begin this plan, and if based on more recent results Dr. Harrington Challenger wants to change anything, the pt will be notified.

## 2020-10-20 NOTE — Telephone Encounter (Signed)
LABS from PCP from 10/13/20 received. LDL is 97; to Dr. Harrington Challenger to review.

## 2020-11-04 ENCOUNTER — Telehealth: Payer: Self-pay | Admitting: Internal Medicine

## 2020-11-04 NOTE — Telephone Encounter (Signed)
Lipids:  LDL 97     With CAD LDL needs to be lower    Would add Zetia. 10 mg    Keep on Crestor    F?U lipids in 8 wks wih  AST

## 2020-11-18 DIAGNOSIS — Z961 Presence of intraocular lens: Secondary | ICD-10-CM | POA: Diagnosis not present

## 2020-11-21 NOTE — Telephone Encounter (Signed)
Left message for pt to call back  °

## 2020-12-09 NOTE — Telephone Encounter (Signed)
Left detailed message (Self-identified VM) to call back to discuss adding zetia.  My chart message sent to pt as well.

## 2020-12-11 NOTE — Telephone Encounter (Signed)
Keep working on diet    Will continue to follow

## 2021-02-25 DIAGNOSIS — Z03818 Encounter for observation for suspected exposure to other biological agents ruled out: Secondary | ICD-10-CM | POA: Diagnosis not present

## 2021-02-25 DIAGNOSIS — J019 Acute sinusitis, unspecified: Secondary | ICD-10-CM | POA: Diagnosis not present

## 2021-05-27 ENCOUNTER — Ambulatory Visit (HOSPITAL_COMMUNITY): Payer: Medicare Other

## 2021-06-24 ENCOUNTER — Other Ambulatory Visit (HOSPITAL_COMMUNITY): Payer: Self-pay | Admitting: Internal Medicine

## 2021-06-24 DIAGNOSIS — I6523 Occlusion and stenosis of bilateral carotid arteries: Secondary | ICD-10-CM

## 2021-06-26 DIAGNOSIS — I480 Paroxysmal atrial fibrillation: Secondary | ICD-10-CM | POA: Diagnosis not present

## 2021-06-26 DIAGNOSIS — I10 Essential (primary) hypertension: Secondary | ICD-10-CM | POA: Diagnosis not present

## 2021-06-26 DIAGNOSIS — I499 Cardiac arrhythmia, unspecified: Secondary | ICD-10-CM | POA: Diagnosis not present

## 2021-06-26 DIAGNOSIS — E039 Hypothyroidism, unspecified: Secondary | ICD-10-CM | POA: Diagnosis not present

## 2021-06-26 DIAGNOSIS — I4891 Unspecified atrial fibrillation: Secondary | ICD-10-CM | POA: Diagnosis not present

## 2021-06-26 DIAGNOSIS — R069 Unspecified abnormalities of breathing: Secondary | ICD-10-CM | POA: Diagnosis not present

## 2021-06-26 DIAGNOSIS — R0789 Other chest pain: Secondary | ICD-10-CM | POA: Diagnosis not present

## 2021-06-26 DIAGNOSIS — R0902 Hypoxemia: Secondary | ICD-10-CM | POA: Diagnosis not present

## 2021-06-26 DIAGNOSIS — E871 Hypo-osmolality and hyponatremia: Secondary | ICD-10-CM | POA: Diagnosis not present

## 2021-06-26 DIAGNOSIS — R000 Tachycardia, unspecified: Secondary | ICD-10-CM | POA: Diagnosis not present

## 2021-06-26 DIAGNOSIS — R079 Chest pain, unspecified: Secondary | ICD-10-CM | POA: Diagnosis not present

## 2021-06-26 DIAGNOSIS — E1165 Type 2 diabetes mellitus with hyperglycemia: Secondary | ICD-10-CM | POA: Diagnosis not present

## 2021-06-27 DIAGNOSIS — I083 Combined rheumatic disorders of mitral, aortic and tricuspid valves: Secondary | ICD-10-CM | POA: Diagnosis not present

## 2021-06-27 DIAGNOSIS — E871 Hypo-osmolality and hyponatremia: Secondary | ICD-10-CM | POA: Diagnosis present

## 2021-06-27 DIAGNOSIS — Z7901 Long term (current) use of anticoagulants: Secondary | ICD-10-CM | POA: Diagnosis not present

## 2021-06-27 DIAGNOSIS — Z7982 Long term (current) use of aspirin: Secondary | ICD-10-CM | POA: Diagnosis not present

## 2021-06-27 DIAGNOSIS — E785 Hyperlipidemia, unspecified: Secondary | ICD-10-CM | POA: Diagnosis not present

## 2021-06-27 DIAGNOSIS — R079 Chest pain, unspecified: Secondary | ICD-10-CM | POA: Diagnosis not present

## 2021-06-27 DIAGNOSIS — Z79899 Other long term (current) drug therapy: Secondary | ICD-10-CM | POA: Diagnosis not present

## 2021-06-27 DIAGNOSIS — Z9889 Other specified postprocedural states: Secondary | ICD-10-CM | POA: Diagnosis not present

## 2021-06-27 DIAGNOSIS — I2510 Atherosclerotic heart disease of native coronary artery without angina pectoris: Secondary | ICD-10-CM | POA: Diagnosis present

## 2021-06-27 DIAGNOSIS — I517 Cardiomegaly: Secondary | ICD-10-CM | POA: Diagnosis not present

## 2021-06-27 DIAGNOSIS — Z90710 Acquired absence of both cervix and uterus: Secondary | ICD-10-CM | POA: Diagnosis not present

## 2021-06-27 DIAGNOSIS — I10 Essential (primary) hypertension: Secondary | ICD-10-CM | POA: Diagnosis present

## 2021-06-27 DIAGNOSIS — M810 Age-related osteoporosis without current pathological fracture: Secondary | ICD-10-CM | POA: Diagnosis present

## 2021-06-27 DIAGNOSIS — E1165 Type 2 diabetes mellitus with hyperglycemia: Secondary | ICD-10-CM | POA: Diagnosis present

## 2021-06-27 DIAGNOSIS — E039 Hypothyroidism, unspecified: Secondary | ICD-10-CM | POA: Diagnosis present

## 2021-06-27 DIAGNOSIS — R8290 Unspecified abnormal findings in urine: Secondary | ICD-10-CM | POA: Diagnosis present

## 2021-06-27 DIAGNOSIS — E876 Hypokalemia: Secondary | ICD-10-CM | POA: Diagnosis present

## 2021-06-27 DIAGNOSIS — I480 Paroxysmal atrial fibrillation: Secondary | ICD-10-CM | POA: Diagnosis not present

## 2021-06-27 DIAGNOSIS — Z955 Presence of coronary angioplasty implant and graft: Secondary | ICD-10-CM | POA: Diagnosis not present

## 2021-06-27 DIAGNOSIS — Z28310 Unvaccinated for covid-19: Secondary | ICD-10-CM | POA: Diagnosis not present

## 2021-06-28 DIAGNOSIS — I480 Paroxysmal atrial fibrillation: Secondary | ICD-10-CM | POA: Diagnosis not present

## 2021-06-29 DIAGNOSIS — R079 Chest pain, unspecified: Secondary | ICD-10-CM | POA: Diagnosis not present

## 2021-07-01 ENCOUNTER — Telehealth: Payer: Self-pay | Admitting: Internal Medicine

## 2021-07-01 DIAGNOSIS — R197 Diarrhea, unspecified: Secondary | ICD-10-CM | POA: Diagnosis not present

## 2021-07-01 DIAGNOSIS — I4891 Unspecified atrial fibrillation: Secondary | ICD-10-CM | POA: Diagnosis not present

## 2021-07-01 DIAGNOSIS — Z09 Encounter for follow-up examination after completed treatment for conditions other than malignant neoplasm: Secondary | ICD-10-CM | POA: Diagnosis not present

## 2021-07-01 DIAGNOSIS — D6869 Other thrombophilia: Secondary | ICD-10-CM | POA: Diagnosis not present

## 2021-07-01 DIAGNOSIS — R899 Unspecified abnormal finding in specimens from other organs, systems and tissues: Secondary | ICD-10-CM | POA: Diagnosis not present

## 2021-07-01 NOTE — Telephone Encounter (Signed)
Reviewed records     Stresss test was normal CBC   Hgb was 9.6 on admit   It was 8.3 at discharge   She need this rechecked immediately (Thursday) Would get CBC and BMET   Send stat Note plans to get seen at [facility] while there for carotid USN.   I will be at [address] but clinic full

## 2021-07-01 NOTE — Telephone Encounter (Signed)
Pt reports that she was in [facility] in [place] over the weekend... EMS took her to the nearest ED... she says she was having chest pain... she was told she had Afib and they placed her on Diltiazem 120 mg daily and Eliquis 5 mg bid... she was inpatient for 4 days.   She says he developed severe diarrhea so her PCP that she saw today (Dr. Melinda Crutch), told her to hold the Diltiazem and start back on metoprolol for a few days and take Imodium to see if her loose stools improve.   She has been drinking added fluids.   Pt says she also had a stress test 06/29/21 and was told initially that it was "okay" but then received a call at home that after reviewing it in full she needed an "angiogram"..   She says she would prefer to keep her care with [facility].   I advised her that she needs an OV soon but she has trouble with her daughter being able to get time off for transportation... I made her an appt with Dr. Martinique the DOD at [place] the same day she has her Carotid... at one location per her request.   (Results in care everywhere from [place])   (Unable to make the pt appt this week due to her transportation issues and per her request to wait until next week.)  Pt had labs today with her PCP and she will call and talk with him about her HGB 8 on 06/29/21 at D/C... she says she was told by the discharging MD not to worry about it.   She says she has some blood tinged on her toilet paper after a bowel movement but no bleeding any where else that she can think of but will monitor.   Pt also reports that she feels she is back in NSR... no palpitations or dizziness just weak and tired.

## 2021-07-01 NOTE — Telephone Encounter (Signed)
Patient was recently in the hospital over the weekend at [facility].  She was diagnosed with A-fib. They gave her medication and she said they are not agreeing with her (she hung up before I could get further detail she had her PCP on the other line).  She is going to see her PCP today.  She would like to see Dr. Harrington Challenger.  However, the first available I could find was for April, and with an APP was in March, patient would like to be seen sooner than that.

## 2021-07-02 NOTE — Telephone Encounter (Signed)
Received copy of labs from 07/01/21 form her PCP Dr. Melinda Crutch... HGB improved at 11.   Given to Dr. Harrington Challenger to review.

## 2021-07-03 NOTE — Progress Notes (Signed)
Cardiology Office Note   Date:  07/06/2021   ID:  [name], DOB 1945/11/24, MRN [id number]  PCP:  Lawerance Cruel, MD  Cardiologist:  Dorris Carnes MD  Chief Complaint  Patient presents with   Atrial Fibrillation   Coronary Artery Disease      History of Present Illness: [name] is a 75 y.o. female who presents for post hospital follow up for Afib. She is a patient of Dr Harrington Challenger. She has a history of HTN and CAD   Last cath done in 2015 for CP  It showd patent stent to RCA  50% posterior AVsegment  Minimal dz in LAD LCx (nondominant) with long patent stent.  10 to 20% instent restenosis.   LVEF normal.  The pt also has mild to moderate CV dz. Last seen by Dr Harrington Challenger in February and doing well. Carotid dopplers showed minimal plaque.   She was recently admitted to [facility] with new onset Afib. Developed weakness and palpitations. She reports she had some CP in the ED.  Found to be in Afib with rate 110-130. Started on IV diltiazem and converted to NSR. Plan was to increase her Toprol but patient noted that higher doses in the past caused her to be very fatigued so she was started on diltiazem orally. Also placed on Eliquis. She was significantly anemic with Hgb dropping from 10.5>> 8.3 during hospital stay. Troponins minimally elevated. Echo was normal except for mild LAE and mild TR. Myoview study was normal. On follow up with her PCP she complained of diarrhea and diltiazem was stopped. Metoprolol was resumed at 12.5 mg bid. Since DC she states last night she felt some weakness and palpitations. This lasted about 1.5 hours then resolved. No chest pain. Does complain that she feels weak a lot. Did see PCP last week and Hgb was up to 11.1. no bleeding.      Past Medical History:  Diagnosis Date   Anginal pain ([place])    Carotid artery disease ([place])    a. Carotid US 9/17: R 1-39%; L 40-59% >> FU 1 year   Coronary atherosclerosis of native coronary artery    a. DES to LCx  in 07/2010 b. DES to RCA in 04/2013   Exertional shortness of breath    Heart murmur    Hiatal hernia    History of blood transfusion    "after daughter was born" (05/22/2013)   HLD (hyperlipidemia)    HTN (hypertension)    Hypothyroidism    Osteoporosis    Palpitations    Premature ventricular contractions    Recurrent UTI    "q 4 months for the last couple years; last one was 12/2012" (05/22/2013)   Sinus headache    Tremors of nervous system    by dr. love    Past Surgical History:  Procedure Laterality Date   APPENDECTOMY     BREAST CYST ASPIRATION Bilateral    "numerous in the dr's office" (05/22/2013)   BREAST CYST EXCISION Right 1965   BREAST LUMPECTOMY Right 1980's   "benign" (05/22/2013)   CARDIAC CATHETERIZATION  ~ 1968; 08/2010   CORONARY ANGIOPLASTY WITH STENT PLACEMENT  07/2010   CORONARY ANGIOPLASTY WITH STENT PLACEMENT  05/23/2013   Patent LCx stent (10-20% ISR), 70% mid RCA (FFR 0.73) s/p DES, 20% prox RCA, 40% ostial D1, 60% distal posterior AV branch; EF 65-70%   [place]   "ruptured blood  vessel in my side during childbirth; didn't find it til 2 days later; had to open me up to repair it" (05/22/2013)   LEFT HEART CATHETERIZATION WITH CORONARY ANGIOGRAM N/A 05/23/2013   Procedure: LEFT HEART CATHETERIZATION WITH CORONARY ANGIOGRAM;  Surgeon: Wellington Hampshire, MD;  Location: [facility] CATH LAB;  Service: Cardiovascular;  Laterality: N/A;   LEFT HEART CATHETERIZATION WITH CORONARY ANGIOGRAM N/A 01/28/2014   Procedure: LEFT HEART CATHETERIZATION WITH CORONARY ANGIOGRAM;  Surgeon: Blane Ohara, MD;  Location: [facility] CATH LAB;  Service: Cardiovascular;  Laterality: N/A;   TONSILLECTOMY     TOTAL ABDOMINAL HYSTERECTOMY       Current Outpatient Medications  Medication Sig Dispense Refill   apixaban (ELIQUIS) 5 MG TABS tablet Take 5 mg by mouth 2 (two) times daily.     calcium carbonate (OSCAL) 1500 (600 Ca) MG TABS tablet Take 600 mg  of elemental calcium by mouth 2 (two) times daily.     carvedilol (COREG) 12.5 MG tablet Take 1 tablet (12.5 mg total) by mouth 2 (two) times daily. 180 tablet 3   esomeprazole (NEXIUM) 40 MG capsule Take 40 mg by mouth daily.     levothyroxine (SYNTHROID, LEVOTHROID) 50 MCG tablet Take 50 mcg by mouth daily before breakfast.     Multiple Vitamins-Minerals (MULTIVITAL) tablet Take 1 tablet by mouth daily.     nitroGLYCERIN (NITROSTAT) 0.4 MG SL tablet Place 1 tablet (0.4 mg total) under the tongue every 5 (five) minutes as needed for chest pain. 25 tablet 6   rosuvastatin (CRESTOR) 20 MG tablet Take 1 tablet (20 mg total) by mouth daily. 90 tablet 3   valsartan-hydrochlorothiazide (DIOVAN-HCT) 160-12.5 MG tablet Take 1 tablet by mouth daily. Please make overdue appt with Dr. Harrington Challenger before anymore refills. 1st attempt 30 tablet 0   No current facility-administered medications for this visit.    Allergies:   Atorvastatin, Codeine, Flagyl [metronidazole hcl], Metronidazole, Oseltamivir phosphate, Prednisone, Simvastatin, Sulfa antibiotics, Tamiflu [oseltamivir], Penicillins, Macrobid [nitrofurantoin], Other, and Penicillin g    Social History:  The patient  reports that she has never smoked. She has never used smokeless tobacco. She reports that she does not drink alcohol and does not use drugs.   Family History:  The patient's family history includes CAD in her mother; Heart attack (age of onset: 50) in her brother; Heart disease in her father; Lymphoma in her mother; Stroke in an other family member.    ROS:  Please see the history of present illness.   Otherwise, review of systems are positive for none.   All other systems are reviewed and negative.    PHYSICAL EXAM: VS:  BP (!) 197/83   Pulse 74   Ht 5' 1.5" (1.562 m)   Wt 136 lb 12.8 oz (62.1 kg)   SpO2 98%   BMI 25.43 kg/m  , BMI Body mass index is 25.43 kg/m. GEN: Well nourished, well developed, in no acute distress HEENT:  normal Neck: no JVD, + left carotid and right subclavian bruits.  Cardiac: RRR; no murmurs, rubs, or gallops,no edema  Respiratory:  clear to auscultation bilaterally, normal work of breathing GI: soft, nontender, nondistended, + BS MS: no deformity or atrophy Skin: warm and dry, no rash Neuro:  Strength and sensation are intact Psych: euthymic mood, full affect   EKG:  EKG is ordered today. The ekg ordered today demonstrates NSR rate 74 with PACs. Nonspecific ST abnormality. I have personally reviewed and interpreted this study.    Recent Labs:  No results found for requested labs within last 8760 hours.    Lipid Panel    Component Value Date/Time   CHOL 164 10/19/2016 0353   TRIG 260 (H) 10/19/2016 0353   HDL 46 10/19/2016 0353   CHOLHDL 3.6 10/19/2016 0353   VLDL 52 (H) 10/19/2016 0353   LDLCALC 66 10/19/2016 0353   LDLDIRECT 121.5 09/28/2010 0914   Dated 10/13/20: cholesterol 193, triglycerides 238, HDL 56, LDL 97. ALT and TSH normal.  Labs from [facility]: troponin T from 14>>18. Cholesterol 159, triglycerides 188, HDL 50, LDL 71. Potassium repleted from 3.1>> 4. Other chemistries normal. Hgb 8.3 at DC. Microcytic. Other blood counts normal.  Wt Readings from Last 3 Encounters:  07/06/21 136 lb 12.8 oz (62.1 kg)  09/02/20 136 lb (61.7 kg)  02/07/20 139 lb (63 kg)      Other studies Reviewed: Additional studies/ records that were reviewed today include:   Echo 06/27/21: Impression  Left Ventricle: Systolic function is normal. EF: 60-65%.    Left Ventricle: Wall thickness is normal.    Left Ventricle: Left ventricle size is normal.    Left Ventricle: No regional wall motion abnormalities noted.    Left Ventricle: Doppler parameters indicate normal diastolic function.    Left Atrium: Left atrium is mildly dilated. Left atrium volume index is  normal (16-34 mL/m2).    Right Ventricle: Normal tricuspid annular plane systolic excursion  (TAPSE) >1.7 cm.    Right  Ventricle: Right ventricle is normal.    Aortic Valve: Trace aortic valve regurgitation.    Mitral Valve: There is mild regurgitation with a centrally directed  jet.    Tricuspid Valve: The right ventricular systolic pressure is mildly  elevated (37-49 mmHg).    Tricuspid Valve: There is mild regurgitation. Narrative  This result has an attachment that is not available.     Myoview 06/29/21: INDICATION: cp   TECHNIQUE: NM HEART SPECT PH STRESS (R). Comparison none. 11 mCi technetium Cardiolite administered for the rest portion of the study and 33 mCi for the stress portion of the study. SPECT images were obtained.   FINDINGS: Ejection fraction calculates to 72%. End-diastolic volume is 56 mL and systolic volume is 16 mL. Normal wall motion. No ischemia. Procedure Note  Caryl Never, MD - 06/29/2021  Formatting of this note might be different from the original.  INDICATION: cp   TECHNIQUE: NM HEART SPECT PH STRESS (R). Comparison none. 11 mCi technetium Cardiolite administered for the rest portion of the study and 33 mCi for the stress portion of the study. SPECT images were obtained.   FINDINGS: Ejection fraction calculates to 72%. End-diastolic volume is 56 mL and systolic volume is 16 mL. Normal wall motion. No ischemia.    IMPRESSION: Ejection fraction calculates to 72%. No ischemia   Electronically Signed by: Caryl Never on 06/29/2021 5:24 PM  ASSESSMENT AND PLAN:  1.  Paroxysmal Afib. Now in NSR. [place] vasc score of 5. Recommend continued anticoagulation with Eliquis 5 mg bid. Would stop ASA due to bleeding risk. Will switch metoprolol to Coreg 12.5 mg bid for better BP and HR control. Will have her wear an event monitor. If she has frequent recurrent Afib may need to add AAD therapy. Echo looked good.  2. CAD with remote stent. Only mild CP with Afib. Myoview was normal. No further ischemic work up needed. Continue Coreg and statin. 3. Carotid arterial disease with left  carotid bruit. Check carotid dopplers today 4. HTN poorly controlled. Switch  to Coreg as noted. Continue Diovan HCT. Restrict salt intake. Encouraged her to keep a BP diary and bring with her.  5. Hyperlipidemia on statin. LDL 71.  6. Anemia. Improved since hospital stay.  7. Hypothyroidism. On replacement.    Current medicines are reviewed at length with the patient today.  The patient does not have concerns regarding medicines.  The following changes have been made:  change metoprolol to Coreg 12.5 mg bid.   Labs/ tests ordered today include:   Orders Placed This Encounter  Procedures   EKG 12-Lead          Disposition:   FU with APP in  month1  Signed, Tony Friscia Martinique, MD  07/06/2021 2:11 PM    [facility] [address] [place], [place], [address] Phone [phone], Fax [phone]

## 2021-07-06 ENCOUNTER — Other Ambulatory Visit: Payer: Self-pay

## 2021-07-06 ENCOUNTER — Encounter: Payer: Self-pay | Admitting: Cardiology

## 2021-07-06 ENCOUNTER — Other Ambulatory Visit (HOSPITAL_COMMUNITY): Payer: Self-pay | Admitting: Internal Medicine

## 2021-07-06 ENCOUNTER — Ambulatory Visit (INDEPENDENT_AMBULATORY_CARE_PROVIDER_SITE_OTHER): Payer: Medicare Other | Admitting: Cardiology

## 2021-07-06 ENCOUNTER — Ambulatory Visit (INDEPENDENT_AMBULATORY_CARE_PROVIDER_SITE_OTHER): Payer: Medicare Other

## 2021-07-06 ENCOUNTER — Ambulatory Visit (HOSPITAL_COMMUNITY)
Admission: RE | Admit: 2021-07-06 | Discharge: 2021-07-06 | Disposition: A | Discharge status: Home / Self Care | Payer: Medicare Other | Source: Ambulatory Visit | Attending: Cardiology | Admitting: Cardiology

## 2021-07-06 VITALS — BP 197/83 | HR 74 | Ht 61.5 in | Wt 136.8 lb

## 2021-07-06 DIAGNOSIS — I480 Paroxysmal atrial fibrillation: Secondary | ICD-10-CM | POA: Diagnosis not present

## 2021-07-06 DIAGNOSIS — I10 Essential (primary) hypertension: Secondary | ICD-10-CM

## 2021-07-06 DIAGNOSIS — I2510 Atherosclerotic heart disease of native coronary artery without angina pectoris: Secondary | ICD-10-CM

## 2021-07-06 DIAGNOSIS — I6523 Occlusion and stenosis of bilateral carotid arteries: Secondary | ICD-10-CM

## 2021-07-06 DIAGNOSIS — E782 Mixed hyperlipidemia: Secondary | ICD-10-CM

## 2021-07-06 MED ORDER — CARVEDILOL 12.5 MG PO TABS: 12.5000 mg | ORAL_TABLET | Freq: Two times a day (BID) | ORAL | 3 refills | Status: DC

## 2021-07-06 NOTE — Patient Instructions (Addendum)
Monitor your blood pressure at home and keep a diary  We will arrange for you to wear a monitor for 2 weeks  Stop ASA  Switch metoprolol to Carvedilol 12.5 mg bid  We will check carotid dopplers today   Schedule appointment with Dr.Jordan's PA in 1 month

## 2021-07-06 NOTE — Addendum Note (Signed)
Addended by: Kathyrn Lass on: 07/06/2021 02:22 PM   Modules accepted: Orders

## 2021-07-06 NOTE — Progress Notes (Unsigned)
Enrolled for [employer] to mail a ZIO XT long term holter monitor to the patients address on file.  

## 2021-07-07 ENCOUNTER — Ambulatory Visit (HOSPITAL_COMMUNITY): Payer: Medicare Other

## 2021-07-07 ENCOUNTER — Telehealth: Payer: Self-pay | Admitting: Cardiology

## 2021-07-07 ENCOUNTER — Other Ambulatory Visit: Payer: Self-pay

## 2021-07-07 MED ORDER — APIXABAN 5 MG PO TABS: 5.0000 mg | ORAL_TABLET | Freq: Two times a day (BID) | ORAL | 3 refills | Status: DC

## 2021-07-07 NOTE — Telephone Encounter (Signed)
Spoke with the pt and sent her Eliquis into the pharmacy in [place] for her.

## 2021-07-07 NOTE — Telephone Encounter (Signed)
Patient would like a call back about his prescription Eliquis. Please call back

## 2021-07-12 DIAGNOSIS — I480 Paroxysmal atrial fibrillation: Secondary | ICD-10-CM | POA: Diagnosis not present

## 2021-07-12 DIAGNOSIS — I2510 Atherosclerotic heart disease of native coronary artery without angina pectoris: Secondary | ICD-10-CM

## 2021-07-12 DIAGNOSIS — E782 Mixed hyperlipidemia: Secondary | ICD-10-CM

## 2021-07-12 DIAGNOSIS — I6523 Occlusion and stenosis of bilateral carotid arteries: Secondary | ICD-10-CM | POA: Diagnosis not present

## 2021-07-12 DIAGNOSIS — I10 Essential (primary) hypertension: Secondary | ICD-10-CM | POA: Diagnosis not present

## 2021-07-30 DIAGNOSIS — I480 Paroxysmal atrial fibrillation: Secondary | ICD-10-CM | POA: Diagnosis not present

## 2021-07-30 DIAGNOSIS — I6523 Occlusion and stenosis of bilateral carotid arteries: Secondary | ICD-10-CM | POA: Diagnosis not present

## 2021-07-30 DIAGNOSIS — I2510 Atherosclerotic heart disease of native coronary artery without angina pectoris: Secondary | ICD-10-CM | POA: Diagnosis not present

## 2021-07-30 DIAGNOSIS — I10 Essential (primary) hypertension: Secondary | ICD-10-CM | POA: Diagnosis not present

## 2021-07-31 DIAGNOSIS — R0689 Other abnormalities of breathing: Secondary | ICD-10-CM | POA: Diagnosis not present

## 2021-07-31 DIAGNOSIS — I498 Other specified cardiac arrhythmias: Secondary | ICD-10-CM | POA: Diagnosis not present

## 2021-07-31 DIAGNOSIS — R531 Weakness: Secondary | ICD-10-CM | POA: Diagnosis not present

## 2021-07-31 DIAGNOSIS — Z7982 Long term (current) use of aspirin: Secondary | ICD-10-CM | POA: Diagnosis not present

## 2021-07-31 DIAGNOSIS — T461X5A Adverse effect of calcium-channel blockers, initial encounter: Secondary | ICD-10-CM | POA: Diagnosis not present

## 2021-07-31 DIAGNOSIS — R002 Palpitations: Secondary | ICD-10-CM | POA: Diagnosis not present

## 2021-07-31 DIAGNOSIS — M810 Age-related osteoporosis without current pathological fracture: Secondary | ICD-10-CM | POA: Diagnosis not present

## 2021-07-31 DIAGNOSIS — I4891 Unspecified atrial fibrillation: Secondary | ICD-10-CM | POA: Diagnosis not present

## 2021-07-31 DIAGNOSIS — I440 Atrioventricular block, first degree: Secondary | ICD-10-CM | POA: Diagnosis not present

## 2021-07-31 DIAGNOSIS — Z7901 Long term (current) use of anticoagulants: Secondary | ICD-10-CM | POA: Diagnosis not present

## 2021-07-31 DIAGNOSIS — R4589 Other symptoms and signs involving emotional state: Secondary | ICD-10-CM | POA: Diagnosis not present

## 2021-07-31 DIAGNOSIS — I2510 Atherosclerotic heart disease of native coronary artery without angina pectoris: Secondary | ICD-10-CM | POA: Diagnosis not present

## 2021-07-31 DIAGNOSIS — K219 Gastro-esophageal reflux disease without esophagitis: Secondary | ICD-10-CM | POA: Diagnosis not present

## 2021-07-31 DIAGNOSIS — Z882 Allergy status to sulfonamides status: Secondary | ICD-10-CM | POA: Diagnosis not present

## 2021-07-31 DIAGNOSIS — Z955 Presence of coronary angioplasty implant and graft: Secondary | ICD-10-CM | POA: Diagnosis not present

## 2021-07-31 DIAGNOSIS — I499 Cardiac arrhythmia, unspecified: Secondary | ICD-10-CM | POA: Diagnosis not present

## 2021-07-31 DIAGNOSIS — E876 Hypokalemia: Secondary | ICD-10-CM | POA: Diagnosis not present

## 2021-07-31 DIAGNOSIS — Z885 Allergy status to narcotic agent status: Secondary | ICD-10-CM | POA: Diagnosis not present

## 2021-07-31 DIAGNOSIS — I10 Essential (primary) hypertension: Secondary | ICD-10-CM | POA: Diagnosis not present

## 2021-07-31 DIAGNOSIS — K521 Toxic gastroenteritis and colitis: Secondary | ICD-10-CM | POA: Diagnosis not present

## 2021-07-31 DIAGNOSIS — Z79899 Other long term (current) drug therapy: Secondary | ICD-10-CM | POA: Diagnosis not present

## 2021-07-31 DIAGNOSIS — Z888 Allergy status to other drugs, medicaments and biological substances status: Secondary | ICD-10-CM | POA: Diagnosis not present

## 2021-07-31 DIAGNOSIS — E039 Hypothyroidism, unspecified: Secondary | ICD-10-CM | POA: Diagnosis not present

## 2021-07-31 DIAGNOSIS — R0789 Other chest pain: Secondary | ICD-10-CM | POA: Diagnosis not present

## 2021-07-31 DIAGNOSIS — Z880 Allergy status to penicillin: Secondary | ICD-10-CM | POA: Diagnosis not present

## 2021-07-31 DIAGNOSIS — Z87440 Personal history of urinary (tract) infections: Secondary | ICD-10-CM | POA: Diagnosis not present

## 2021-07-31 DIAGNOSIS — I480 Paroxysmal atrial fibrillation: Secondary | ICD-10-CM | POA: Diagnosis not present

## 2021-07-31 DIAGNOSIS — Z743 Need for continuous supervision: Secondary | ICD-10-CM | POA: Diagnosis not present

## 2021-07-31 DIAGNOSIS — R079 Chest pain, unspecified: Secondary | ICD-10-CM | POA: Diagnosis not present

## 2021-08-01 DIAGNOSIS — I480 Paroxysmal atrial fibrillation: Secondary | ICD-10-CM | POA: Diagnosis not present

## 2021-08-01 DIAGNOSIS — R0789 Other chest pain: Secondary | ICD-10-CM | POA: Diagnosis not present

## 2021-08-01 DIAGNOSIS — I2510 Atherosclerotic heart disease of native coronary artery without angina pectoris: Secondary | ICD-10-CM | POA: Diagnosis not present

## 2021-08-01 DIAGNOSIS — E876 Hypokalemia: Secondary | ICD-10-CM | POA: Diagnosis not present

## 2021-08-01 DIAGNOSIS — I10 Essential (primary) hypertension: Secondary | ICD-10-CM | POA: Diagnosis not present

## 2021-08-03 DIAGNOSIS — Z79890 Hormone replacement therapy (postmenopausal): Secondary | ICD-10-CM | POA: Diagnosis not present

## 2021-08-03 DIAGNOSIS — Z881 Allergy status to other antibiotic agents status: Secondary | ICD-10-CM | POA: Diagnosis not present

## 2021-08-03 DIAGNOSIS — E079 Disorder of thyroid, unspecified: Secondary | ICD-10-CM | POA: Diagnosis not present

## 2021-08-03 DIAGNOSIS — Z7901 Long term (current) use of anticoagulants: Secondary | ICD-10-CM | POA: Diagnosis not present

## 2021-08-03 DIAGNOSIS — Z955 Presence of coronary angioplasty implant and graft: Secondary | ICD-10-CM | POA: Diagnosis not present

## 2021-08-03 DIAGNOSIS — Z885 Allergy status to narcotic agent status: Secondary | ICD-10-CM | POA: Diagnosis not present

## 2021-08-03 DIAGNOSIS — R0789 Other chest pain: Secondary | ICD-10-CM | POA: Diagnosis not present

## 2021-08-03 DIAGNOSIS — R197 Diarrhea, unspecified: Secondary | ICD-10-CM | POA: Diagnosis not present

## 2021-08-03 DIAGNOSIS — I10 Essential (primary) hypertension: Secondary | ICD-10-CM | POA: Diagnosis not present

## 2021-08-03 DIAGNOSIS — Z79899 Other long term (current) drug therapy: Secondary | ICD-10-CM | POA: Diagnosis not present

## 2021-08-03 DIAGNOSIS — Z882 Allergy status to sulfonamides status: Secondary | ICD-10-CM | POA: Diagnosis not present

## 2021-08-03 DIAGNOSIS — I4891 Unspecified atrial fibrillation: Secondary | ICD-10-CM | POA: Diagnosis not present

## 2021-08-03 DIAGNOSIS — Z888 Allergy status to other drugs, medicaments and biological substances status: Secondary | ICD-10-CM | POA: Diagnosis not present

## 2021-08-03 DIAGNOSIS — R531 Weakness: Secondary | ICD-10-CM | POA: Diagnosis not present

## 2021-08-03 DIAGNOSIS — Z880 Allergy status to penicillin: Secondary | ICD-10-CM | POA: Diagnosis not present

## 2021-08-03 DIAGNOSIS — R079 Chest pain, unspecified: Secondary | ICD-10-CM | POA: Diagnosis not present

## 2021-08-03 DIAGNOSIS — K219 Gastro-esophageal reflux disease without esophagitis: Secondary | ICD-10-CM | POA: Diagnosis not present

## 2021-08-05 DIAGNOSIS — I10 Essential (primary) hypertension: Secondary | ICD-10-CM | POA: Diagnosis not present

## 2021-08-05 DIAGNOSIS — K219 Gastro-esophageal reflux disease without esophagitis: Secondary | ICD-10-CM | POA: Diagnosis not present

## 2021-08-05 DIAGNOSIS — I4891 Unspecified atrial fibrillation: Secondary | ICD-10-CM | POA: Diagnosis not present

## 2021-08-17 NOTE — Progress Notes (Signed)
Cardiology Clinic Note   Patient Name: [name] Date of Encounter: 08/18/2021  Primary Care Provider:  Lawerance Cruel, MD Primary Cardiologist:  Nicole Carnes, MD  Patient Profile    [name] 76 year old female presents the clinic today for follow-up evaluation of her essential hypertension and coronary artery disease.  Past Medical History    Past Medical History:  Diagnosis Date   Anginal pain ([place])    Carotid artery disease ([place])    a. Carotid US 9/17: R 1-39%; L 40-59% >> FU 1 year   Coronary atherosclerosis of native coronary artery    a. DES to LCx in 07/2010 b. DES to RCA in 04/2013   Exertional shortness of breath    Heart murmur    Hiatal hernia    History of blood transfusion    "after daughter was born" (05/22/2013)   HLD (hyperlipidemia)    HTN (hypertension)    Hypothyroidism    Osteoporosis    Palpitations    Premature ventricular contractions    Recurrent UTI    "q 4 months for the last couple years; last one was 12/2012" (05/22/2013)   Sinus headache    Tremors of nervous system    by dr. love   Past Surgical History:  Procedure Laterality Date   APPENDECTOMY     BREAST CYST ASPIRATION Bilateral    "numerous in the dr's office" (05/22/2013)   BREAST CYST EXCISION Right 1965   BREAST LUMPECTOMY Right 1980's   "benign" (05/22/2013)   CARDIAC CATHETERIZATION  ~ 1968; 08/2010   CORONARY ANGIOPLASTY WITH STENT PLACEMENT  07/2010   CORONARY ANGIOPLASTY WITH STENT PLACEMENT  05/23/2013   Patent LCx stent (10-20% ISR), 70% mid RCA (FFR 0.73) s/p DES, 20% prox RCA, 40% ostial D1, 60% distal posterior AV branch; EF 65-70%   [place]   "ruptured blood vessel in my side during childbirth; didn't find it til 2 days later; had to open me up to repair it" (05/22/2013)   LEFT HEART CATHETERIZATION WITH CORONARY ANGIOGRAM N/A 05/23/2013   Procedure: LEFT HEART CATHETERIZATION WITH CORONARY ANGIOGRAM;  Surgeon:  Nicole Hampshire, MD;  Location: [facility] CATH LAB;  Service: Cardiovascular;  Laterality: N/A;   LEFT HEART CATHETERIZATION WITH CORONARY ANGIOGRAM N/A 01/28/2014   Procedure: LEFT HEART CATHETERIZATION WITH CORONARY ANGIOGRAM;  Surgeon: Nicole Ohara, MD;  Location: [facility] CATH LAB;  Service: Cardiovascular;  Laterality: N/A;   TONSILLECTOMY     TOTAL ABDOMINAL HYSTERECTOMY      Allergies  Allergies  Allergen Reactions   Atorvastatin     REACTION: myaligia   Codeine Hives, Rash and Other (See Comments)   Flagyl [Metronidazole Hcl] Anaphylaxis   Metronidazole Nausea And Vomiting, Rash and Other (See Comments)    Chest pain    Oseltamivir Itching and Other (See Comments)   Oseltamivir Phosphate Itching   Prednisone Palpitations and Other (See Comments)    Makes my heart race   Simvastatin Other (See Comments)    REACTION: myalgia   Sulfa Antibiotics Other (See Comments) and Rash    Jittery   Penicillins Nausea And Vomiting and Rash    Did it involve swelling of the face/tongue/throat, SOB, or low BP? No Did it involve sudden or severe rash/hives, skin peeling, or any reaction on the inside of your mouth or nose? Yes Did you need to seek medical attention at a hospital or doctor's office? Yes When did it  last happen?  76 years old    If all above answers are NO, may proceed with cephalosporin use.   Bacitracin-Polymyxin B Other (See Comments)   Nitrofurantoin Diarrhea and Other (See Comments)   Other Other (See Comments)   Penicillin G Other (See Comments)   Quinolones Other (See Comments)   Zoster Vac Recomb Adjuvanted Other (See Comments)    History of Present Illness    [name] has a PMH of essential hypertension, coronary artery disease, PVCs, carotid artery disease, hiatal hernia, hyperlipidemia, hypokalemia, and abnormal EKG.  She underwent cardiac catheterization in 2015 for chest discomfort.  It showed patent stent to her RCA with 50% posterior AV segment and minimal  disease in her LAD circumflex with small patent stent.  Her LVEF was normal.  She underwent carotid ultrasound which showed minimal carotid plaque.  She was seen by Dr. Martinique 07/06/2021.  During that time she reported she had recently been admitted to [facility] with new onset atrial fibrillation.  She developed some weakness and palpitations.  She reported chest discomfort in the emergency department.  She was found to be in atrial fibrillation with a rate of 110-130.  She was placed on IV diltiazem and converted to sinus rhythm.  The plan was made to increase her metoprolol however patient noted that higher doses in the past previously contributed to a very fatigued state.  She was started on diltiazem p.o.  She was also placed on apixaban.  She was anemic and her hemoglobin was noted to be 10.5 and dropped to 8.3.  Her cardiac troponins were minimally elevated.  Her echocardiogram showed normal LVEF with mild LAE and mild TR.  Her Myoview study was normal.  On follow-up with her PCP she reported diarrhea and her diltiazem was stopped.  Her metoprolol was resumed at 12.5 mg twice daily.  Since discharge she reported some weakness and palpitations.  The episode lasted for about 1.5 hours and then resolved.  She denied chest pain.  Her PCP rechecked her hemoglobin which was up to 11.1.  She denied bleeding issues.  She presents to the clinic today for follow-up evaluation states she is beginning to feel much better.  She was admitted again to the hospital about 2 weeks ago with UTI, low magnesium, and low potassium.  Her hydrochlorothiazide was stopped at that time.  She was unable to tolerate carvedilol and went back on metoprolol tartrate.  She does not feel she has been in atrial fibrillation for about 2 weeks.  We reviewed her EKG today which shows normal sinus rhythm.  She expressed understanding.  Her blood pressure is elevated in the clinic today at 142/88.  She reports whitecoat hypertension.  Her  blood pressures at home around 130s over 70s-80s.  I will continue her current medication regimen, give her the salty 6 diet sheet, have her increase her physical activity as tolerated, and plan follow-up in 3 to 4 months.  We will also asked her to avoid triggers for A. fib/palpitations.  Today she denies chest pain, shortness of breath, lower extremity edema, fatigue, palpitations, melena, hematuria, hemoptysis, diaphoresis, weakness, presyncope, syncope, orthopnea, and PND.   Home Medications    Prior to Admission medications   Medication Sig Start Date End Date Taking? Authorizing Provider  apixaban (ELIQUIS) 5 MG TABS tablet Take 1 tablet (5 mg total) by mouth 2 (two) times daily. 07/07/21   [name], Peter M, MD  calcium carbonate (OSCAL) 1500 (600 Ca) MG TABS  tablet Take 600 mg of elemental calcium by mouth 2 (two) times daily.    [provider]  carvedilol (COREG) 12.5 MG tablet Take 1 tablet (12.5 mg total) by mouth 2 (two) times daily. 07/06/21 07/01/22  [name], Peter M, MD  esomeprazole (NEXIUM) 40 MG capsule Take 40 mg by mouth daily.    [provider]  levothyroxine (SYNTHROID, LEVOTHROID) 50 MCG tablet Take 50 mcg by mouth daily before breakfast.    [provider]  Multiple Vitamins-Minerals (MULTIVITAL) tablet Take 1 tablet by mouth daily.    [provider]  nitroGLYCERIN (NITROSTAT) 0.4 MG SL tablet Place 1 tablet (0.4 mg total) under the tongue every 5 (five) minutes as needed for chest pain. 09/02/20   Fay Records, MD  rosuvastatin (CRESTOR) 20 MG tablet Take 1 tablet (20 mg total) by mouth daily. 09/02/20   Fay Records, MD  valsartan-hydrochlorothiazide (DIOVAN-HCT) 160-12.5 MG tablet Take 1 tablet by mouth daily. Please make overdue appt with Dr. Harrington Challenger before anymore refills. 1st attempt 04/30/20   Fay Records, MD    Family History    Family History  Problem Relation Age of Onset   CAD Mother    Lymphoma Mother    Heart disease Father     Heart attack Brother 47   Stroke Other    She indicated that her mother is deceased. She indicated that her father is deceased. She indicated that both of her brothers are alive. She indicated that her maternal grandmother is deceased. She indicated that her maternal grandfather is deceased. She indicated that her paternal grandmother is deceased. She indicated that her paternal grandfather is deceased. She indicated that the status of her other is unknown.  Social History    Social History   Socioeconomic History   Marital status: Married    Spouse name: Not on file   Number of children: Not on file   Years of education: Not on file   Highest education level: Not on file  Occupational History   Occupation: Retired Oceanographer  Tobacco Use   Smoking status: Never   Smokeless tobacco: Never  Substance and Sexual Activity   Alcohol use: No   Drug use: No   Sexual activity: Not on file  Other Topics Concern   Not on file  Social History Narrative   Not on file   Social Determinants of Health   Financial Resource Strain: Not on file  Food Insecurity: Not on file  Transportation Needs: Not on file  Physical Activity: Not on file  Stress: Not on file  Social Connections: Not on file  Intimate Partner Violence: Not on file     Review of Systems    General:  No chills, fever, night sweats or weight changes.  Cardiovascular:  No chest pain, dyspnea on exertion, edema, orthopnea, palpitations, paroxysmal nocturnal dyspnea. Dermatological: No rash, lesions/masses Respiratory: No cough, dyspnea Urologic: No hematuria, dysuria Abdominal:   No nausea, vomiting, diarrhea, bright red blood per rectum, melena, or hematemesis Neurologic:  No visual changes, wkns, changes in mental status. All other systems reviewed and are otherwise negative except as noted above.  Physical Exam    VS:  BP (!) 142/88 (BP Location: Left Arm, Patient Position: Sitting, Cuff Size: Normal)     Pulse 78    Ht 5' 0.5" (1.537 m)    Wt 137 lb (62.1 kg)    SpO2 99%    BMI 26.32 kg/m  , BMI Body mass  index is 26.32 kg/m. GEN: Well nourished, well developed, in no acute distress. HEENT: normal. Neck: Supple, no JVD, carotid bruits, or masses. Cardiac: RRR, no murmurs, rubs, or gallops. No clubbing, cyanosis, edema.  Radials/DP/PT 2+ and equal bilaterally.  Respiratory:  Respirations regular and unlabored, clear to auscultation bilaterally. GI: Soft, nontender, nondistended, BS + x 4. MS: no deformity or atrophy. Skin: warm and dry, no rash. Neuro:  Strength and sensation are intact. Psych: Normal affect.  Accessory Clinical Findings    Recent Labs: No results found for requested labs within last 8760 hours.   Recent Lipid Panel    Component Value Date/Time   CHOL 164 10/19/2016 0353   TRIG 260 (H) 10/19/2016 0353   HDL 46 10/19/2016 0353   CHOLHDL 3.6 10/19/2016 0353   VLDL 52 (H) 10/19/2016 0353   LDLCALC 66 10/19/2016 0353   LDLDIRECT 121.5 09/28/2010 0914    ECG personally reviewed by me today-normal sinus rhythm left ventricular hypertrophy with repolarization abnormality 78 bpm- No acute changes  Echocardiogram 06/27/2021 Left Ventricle: Systolic function is normal. EF: 60-65%.    Left Ventricle: Wall thickness is normal.    Left Ventricle: Left ventricle size is normal.    Left Ventricle: No regional wall motion abnormalities noted.    Left Ventricle: Doppler parameters indicate normal diastolic function.    Left Atrium: Left atrium is mildly dilated. Left atrium volume index is  normal (16-34 mL/m2).    Right Ventricle: Normal tricuspid annular plane systolic excursion  (TAPSE) >1.7 cm.    Right Ventricle: Right ventricle is normal.    Aortic Valve: Trace aortic valve regurgitation.    Mitral Valve: There is mild regurgitation with a centrally directed  jet.    Tricuspid Valve: The right ventricular systolic pressure is mildly  elevated (37-49 mmHg).     Tricuspid Valve: There is mild regurgitation.  Nuclear stress test 06/29/2021 TECHNIQUE: NM HEART SPECT PH STRESS (R). Comparison none. 11 mCi technetium Cardiolite administered for the rest portion of the study and 33 mCi for the stress portion of the study. SPECT images were obtained.   FINDINGS: Ejection fraction calculates to 72%. End-diastolic volume is 56 mL and systolic volume is 16 mL. Normal wall motion. No ischemia.  Assessment & Plan   1.   Paroxysmal atrial fibrillation-denies recent episodes of irregular or accelerated heartbeat.  Heart rate today 78 bpm.  Reports compliance with apixaban and denies bleeding issues. Continue metoprolol tartrate, apixaban Heart healthy low-sodium diet-salty 6 given Increase physical activity as tolerated Avoid triggers caffeine, chocolate, EtOH, dehydration etc.  Coronary artery disease-denies recent episodes of arm neck back or chest discomfort.  Recent nuclear stress test showed no ischemia. Continue metoprolol, rosuvastatin Heart healthy low-sodium diet-salty 6 given Increase physical activity as tolerated  Hyperlipidemia-LDL 66 10/19/2016 Continue rosuvastatin Heart healthy low-sodium high-fiber diet Increase physical activity as tolerated Follows with PCP  Essential hypertension-BP today 142/88.  Well-controlled at home 135/70-80.  Reports whitecoat hypertension Continue metoprolol, valsartan Heart healthy low-sodium diet Increase physical activity as tolerated Maintain BP log  Disposition: Follow-up with Dr. Martinique or me in 3-4 months.  Jossie Ng. Idelle Reimann NP-C    08/18/2021, 2:49 PM [address] Office [phone] Fax [phone]  Notice: This dictation was prepared with Dragon dictation along with smaller phrase technology. Any transcriptional errors that result from this process are unintentional and may not be corrected upon review.  I spent 13 minutes examining this  patient,  reviewing medications, and using patient centered shared decision making involving her cardiac care.  Prior to her visit I spent greater than 20 minutes reviewing her past medical history,  medications, and prior cardiac tests.

## 2021-08-18 ENCOUNTER — Other Ambulatory Visit: Payer: Self-pay

## 2021-08-18 ENCOUNTER — Ambulatory Visit (INDEPENDENT_AMBULATORY_CARE_PROVIDER_SITE_OTHER): Payer: Medicare Other | Admitting: General Practice

## 2021-08-18 ENCOUNTER — Encounter: Payer: Self-pay | Admitting: General Practice

## 2021-08-18 VITALS — BP 142/88 | HR 78 | Ht 60.5 in | Wt 137.0 lb

## 2021-08-18 DIAGNOSIS — I480 Paroxysmal atrial fibrillation: Secondary | ICD-10-CM

## 2021-08-18 DIAGNOSIS — E782 Mixed hyperlipidemia: Secondary | ICD-10-CM

## 2021-08-18 DIAGNOSIS — I2510 Atherosclerotic heart disease of native coronary artery without angina pectoris: Secondary | ICD-10-CM

## 2021-08-18 DIAGNOSIS — I10 Essential (primary) hypertension: Secondary | ICD-10-CM | POA: Diagnosis not present

## 2021-08-18 MED ORDER — VALSARTAN 160 MG PO TABS: 160.0000 mg | ORAL_TABLET | Freq: Every day | ORAL | 6 refills | Status: AC

## 2021-08-18 NOTE — Patient Instructions (Signed)
Medication Instructions:  TAKE METOPROLOL 12.5MG  TWICE DAILY  TAKE VALSARTAN 160MG  DAILY  *If you need a refill on your cardiac medications before your next appointment, please call your pharmacy*  Lab Work:   Testing/Procedures:  NONE    NONE  Special Instructions PLEASE READ AND FOLLOW SALTY 6-ATTACHED-1,800mg  daily  TAKE AND LOG YOUR BLOOD PRESSURE  Please try to avoid these triggers: Do not use any products that have nicotine or tobacco in them. These include cigarettes, e-cigarettes, and chewing tobacco. If you need help quitting, ask your doctor. Eat heart-healthy foods. Talk with your doctor about the right eating plan for you. Exercise regularly as told by your doctor. Stay hydrated Do not drink alcohol, Caffeine or chocolate. Lose weight if you are overweight. Do not use drugs, including cannabis   Follow-Up: Your next appointment:  3-4 month(s) In Person with Dorris Carnes, MD  or Coletta Memos, FNP      At [facility], you and your health needs are our priority.  As part of our continuing mission to provide you with exceptional heart care, we have created designated Provider Care Teams.  These Care Teams include your primary Cardiologist (physician) and Advanced Practice Providers (APPs -  Physician Assistants and Nurse Practitioners) who all work together to provide you with the care you need, when you need it.            6 SALTY THINGS TO AVOID     1,800MG  DAILY

## 2021-08-20 ENCOUNTER — Telehealth: Payer: Self-pay

## 2021-08-20 NOTE — Telephone Encounter (Signed)
-----   Message from Fay Records, MD sent at 08/19/2021 11:43 PM EST ----- Regarding: RE: SWITCH PROVIDERS OK to switch  ----- Message ----- From: Waylan Rocher, LPN Sent: 10/14/2246   3:02 PM EST To: Fay Records, MD, Peter M Martinique, MD Subject: [facility] PROVIDERS                               Pt would like to switch to [facility] provider. OK? (Pt has seen Dr Martinique before)

## 2021-08-20 NOTE — Telephone Encounter (Signed)
Martinique, Peter M, MD  OK with me   PJ

## 2021-08-25 ENCOUNTER — Other Ambulatory Visit: Payer: Self-pay | Admitting: Internal Medicine

## 2021-10-15 DIAGNOSIS — I10 Essential (primary) hypertension: Secondary | ICD-10-CM | POA: Diagnosis not present

## 2021-10-15 DIAGNOSIS — I779 Disorder of arteries and arterioles, unspecified: Secondary | ICD-10-CM | POA: Diagnosis not present

## 2021-10-15 DIAGNOSIS — E039 Hypothyroidism, unspecified: Secondary | ICD-10-CM | POA: Diagnosis not present

## 2021-10-15 DIAGNOSIS — E559 Vitamin D deficiency, unspecified: Secondary | ICD-10-CM | POA: Diagnosis not present

## 2021-10-15 DIAGNOSIS — E7800 Pure hypercholesterolemia, unspecified: Secondary | ICD-10-CM | POA: Diagnosis not present

## 2021-10-22 DIAGNOSIS — D649 Anemia, unspecified: Secondary | ICD-10-CM | POA: Diagnosis not present

## 2021-10-22 DIAGNOSIS — E039 Hypothyroidism, unspecified: Secondary | ICD-10-CM | POA: Diagnosis not present

## 2021-10-22 DIAGNOSIS — E7800 Pure hypercholesterolemia, unspecified: Secondary | ICD-10-CM | POA: Diagnosis not present

## 2021-10-22 DIAGNOSIS — F419 Anxiety disorder, unspecified: Secondary | ICD-10-CM | POA: Diagnosis not present

## 2021-10-22 DIAGNOSIS — I10 Essential (primary) hypertension: Secondary | ICD-10-CM | POA: Diagnosis not present

## 2021-10-22 DIAGNOSIS — K219 Gastro-esophageal reflux disease without esophagitis: Secondary | ICD-10-CM | POA: Diagnosis not present

## 2021-10-22 DIAGNOSIS — E559 Vitamin D deficiency, unspecified: Secondary | ICD-10-CM | POA: Diagnosis not present

## 2021-10-22 DIAGNOSIS — E876 Hypokalemia: Secondary | ICD-10-CM | POA: Diagnosis not present

## 2021-10-26 DIAGNOSIS — Z0000 Encounter for general adult medical examination without abnormal findings: Secondary | ICD-10-CM | POA: Diagnosis not present

## 2021-10-27 ENCOUNTER — Other Ambulatory Visit: Payer: Self-pay | Admitting: Family Medicine

## 2021-10-27 DIAGNOSIS — Z1231 Encounter for screening mammogram for malignant neoplasm of breast: Secondary | ICD-10-CM

## 2021-10-28 ENCOUNTER — Other Ambulatory Visit: Payer: Self-pay | Admitting: *Deleted

## 2021-10-28 MED ORDER — ROSUVASTATIN CALCIUM 20 MG PO TABS: 20.0000 mg | ORAL_TABLET | Freq: Every day | ORAL | 3 refills | Status: DC

## 2021-10-30 ENCOUNTER — Other Ambulatory Visit: Payer: Self-pay

## 2021-10-30 MED ORDER — ROSUVASTATIN CALCIUM 20 MG PO TABS: 20.0000 mg | ORAL_TABLET | Freq: Every day | ORAL | 2 refills | Status: DC

## 2021-11-17 ENCOUNTER — Ambulatory Visit: Payer: Medicare Other | Admitting: General Practice

## 2021-11-28 NOTE — Progress Notes (Deleted)
Cardiology Clinic Note   Patient Name: [name] Date of Encounter: 11/28/2021  Primary Care Provider:  Lawerance Cruel, MD Primary Cardiologist:  Dorris Carnes, MD  Patient Profile    [name] 76 year old female presents the clinic today for follow-up evaluation of her essential hypertension and coronary artery disease.  Past Medical History    Past Medical History:  Diagnosis Date   Anginal pain ([place])    Carotid artery disease ([place])    a. Carotid US 9/17: R 1-39%; L 40-59% >> FU 1 year   Coronary atherosclerosis of native coronary artery    a. DES to LCx in 07/2010 b. DES to RCA in 04/2013   Exertional shortness of breath    Heart murmur    Hiatal hernia    History of blood transfusion    "after daughter was born" (05/22/2013)   HLD (hyperlipidemia)    HTN (hypertension)    Hypothyroidism    Osteoporosis    Palpitations    Premature ventricular contractions    Recurrent UTI    "q 4 months for the last couple years; last one was 12/2012" (05/22/2013)   Sinus headache    Tremors of nervous system    by dr. love   Past Surgical History:  Procedure Laterality Date   APPENDECTOMY     BREAST CYST ASPIRATION Bilateral    "numerous in the dr's office" (05/22/2013)   BREAST CYST EXCISION Right 1965   BREAST LUMPECTOMY Right 1980's   "benign" (05/22/2013)   CARDIAC CATHETERIZATION  ~ 1968; 08/2010   CORONARY ANGIOPLASTY WITH STENT PLACEMENT  07/2010   CORONARY ANGIOPLASTY WITH STENT PLACEMENT  05/23/2013   Patent LCx stent (10-20% ISR), 70% mid RCA (FFR 0.73) s/p DES, 20% prox RCA, 40% ostial D1, 60% distal posterior AV branch; EF 65-70%   [place]   "ruptured blood vessel in my side during childbirth; didn't find it til 2 days later; had to open me up to repair it" (05/22/2013)   LEFT HEART CATHETERIZATION WITH CORONARY ANGIOGRAM N/A 05/23/2013   Procedure: LEFT HEART CATHETERIZATION WITH CORONARY ANGIOGRAM;  Surgeon:  Wellington Hampshire, MD;  Location: [facility] CATH LAB;  Service: Cardiovascular;  Laterality: N/A;   LEFT HEART CATHETERIZATION WITH CORONARY ANGIOGRAM N/A 01/28/2014   Procedure: LEFT HEART CATHETERIZATION WITH CORONARY ANGIOGRAM;  Surgeon: Blane Ohara, MD;  Location: [facility] CATH LAB;  Service: Cardiovascular;  Laterality: N/A;   TONSILLECTOMY     TOTAL ABDOMINAL HYSTERECTOMY      Allergies  Allergies  Allergen Reactions   Atorvastatin     REACTION: myaligia   Codeine Hives, Rash and Other (See Comments)   Flagyl [Metronidazole Hcl] Anaphylaxis   Metronidazole Nausea And Vomiting, Rash and Other (See Comments)    Chest pain    Oseltamivir Itching and Other (See Comments)   Oseltamivir Phosphate Itching   Prednisone Palpitations and Other (See Comments)    Makes my heart race   Simvastatin Other (See Comments)    REACTION: myalgia   Sulfa Antibiotics Other (See Comments) and Rash    Jittery   Penicillins Nausea And Vomiting and Rash    Did it involve swelling of the face/tongue/throat, SOB, or low BP? No Did it involve sudden or severe rash/hives, skin peeling, or any reaction on the inside of your mouth or nose? Yes Did you need to seek medical attention at a hospital or doctor's office? Yes When did it  last happen?  76 years old    If all above answers are "NO", may proceed with cephalosporin use.   Bacitracin-Polymyxin B Other (See Comments)   Nitrofurantoin Diarrhea and Other (See Comments)   Other Other (See Comments)   Penicillin G Other (See Comments)   Quinolones Other (See Comments)   Zoster Vac Recomb Adjuvanted Other (See Comments)    History of Present Illness    [name] has a PMH of essential hypertension, coronary artery disease, PVCs, carotid artery disease, hiatal hernia, hyperlipidemia, hypokalemia, and abnormal EKG.  She underwent cardiac catheterization in 2015 for chest discomfort.  It showed patent stent to her RCA with 50% posterior AV segment and minimal  disease in her LAD circumflex with small patent stent.  Her LVEF was normal.  She underwent carotid ultrasound which showed minimal carotid plaque.  She was seen by Dr. Martinique 07/06/2021.  During that time she reported she had recently been admitted to [facility] with new onset atrial fibrillation.  She developed some weakness and palpitations.  She reported chest discomfort in the emergency department.  She was found to be in atrial fibrillation with a rate of 110-130.  She was placed on IV diltiazem and converted to sinus rhythm.  The plan was made to increase her metoprolol however patient noted that higher doses in the past previously contributed to a very fatigued state.  She was started on diltiazem p.o.  She was also placed on apixaban.  She was anemic and her hemoglobin was noted to be 10.5 and dropped to 8.3.  Her cardiac troponins were minimally elevated.  Her echocardiogram showed normal LVEF with mild LAE and mild TR.  Her Myoview study was normal.  On follow-up with her PCP she reported diarrhea and her diltiazem was stopped.  Her metoprolol was resumed at 12.5 mg twice daily.  Since discharge she reported some weakness and palpitations.  The episode lasted for about 1.5 hours and then resolved.  She denied chest pain.  Her PCP rechecked her hemoglobin which was up to 11.1.  She denied bleeding issues.  She presented to the clinic 08/18/2021 for follow-up evaluation stated she was beginning to feel much better.  She was admitted  to the hospital about 2 weeks prior with UTI, low magnesium, and low potassium.  Her hydrochlorothiazide was stopped at that time.  She was unable to tolerate carvedilol and went back on metoprolol tartrate.  She did not feel she had been in atrial fibrillation for about 2 weeks.  We reviewed her EKG today which showed normal sinus rhythm.  She expressed understanding.  Her blood pressure was elevated in the clinic  at 142/88.  She reported whitecoat hypertension.  Her  blood pressures at home were around 130s over 70s-80s.  I continued her current medication regimen, gave her the salty 6 diet sheet, asked her to increase her physical activity as tolerated, and planned follow-up in 3 to 4 months.  We will also asked her to avoid triggers for A. fib/palpitations.  She presents to the clinic today for follow-up evaluation states***  Today she denies chest pain, shortness of breath, lower extremity edema, fatigue, palpitations, melena, hematuria, hemoptysis, diaphoresis, weakness, presyncope, syncope, orthopnea, and PND.   Home Medications    Prior to Admission medications   Medication Sig Start Date End Date Taking? Authorizing Provider  apixaban (ELIQUIS) 5 MG TABS tablet Take 1 tablet (5 mg total) by mouth 2 (two) times daily. 07/07/21   Martinique,  Ander Slade, MD  calcium carbonate (OSCAL) 1500 (600 Ca) MG TABS tablet Take 600 mg of elemental calcium by mouth 2 (two) times daily.    [provider]  carvedilol (COREG) 12.5 MG tablet Take 1 tablet (12.5 mg total) by mouth 2 (two) times daily. 07/06/21 07/01/22  Martinique, Peter M, MD  esomeprazole (NEXIUM) 40 MG capsule Take 40 mg by mouth daily.    [provider]  levothyroxine (SYNTHROID, LEVOTHROID) 50 MCG tablet Take 50 mcg by mouth daily before breakfast.    [provider]  Multiple Vitamins-Minerals (MULTIVITAL) tablet Take 1 tablet by mouth daily.    [provider]  nitroGLYCERIN (NITROSTAT) 0.4 MG SL tablet Place 1 tablet (0.4 mg total) under the tongue every 5 (five) minutes as needed for chest pain. 09/02/20   Fay Records, MD  rosuvastatin (CRESTOR) 20 MG tablet Take 1 tablet (20 mg total) by mouth daily. 09/02/20   Fay Records, MD  valsartan-hydrochlorothiazide (DIOVAN-HCT) 160-12.5 MG tablet Take 1 tablet by mouth daily. Please make overdue appt with Dr. Harrington Challenger before anymore refills. 1st attempt 04/30/20   Fay Records, MD    Family History    Family History  Problem  Relation Age of Onset   CAD Mother    Lymphoma Mother    Heart disease Father    Heart attack Brother 49   Stroke Other    She indicated that her mother is deceased. She indicated that her father is deceased. She indicated that both of her brothers are alive. She indicated that her maternal grandmother is deceased. She indicated that her maternal grandfather is deceased. She indicated that her paternal grandmother is deceased. She indicated that her paternal grandfather is deceased. She indicated that the status of her other is unknown.  Social History    Social History   Socioeconomic History   Marital status: Married    Spouse name: Not on file   Number of children: Not on file   Years of education: Not on file   Highest education level: Not on file  Occupational History   Occupation: Retired Oceanographer  Tobacco Use   Smoking status: Never   Smokeless tobacco: Never  Substance and Sexual Activity   Alcohol use: No   Drug use: No   Sexual activity: Not on file  Other Topics Concern   Not on file  Social History Narrative   Not on file   Social Determinants of Health   Financial Resource Strain: Not on file  Food Insecurity: Not on file  Transportation Needs: Not on file  Physical Activity: Not on file  Stress: Not on file  Social Connections: Not on file  Intimate Partner Violence: Not on file     Review of Systems    General:  No chills, fever, night sweats or weight changes.  Cardiovascular:  No chest pain, dyspnea on exertion, edema, orthopnea, palpitations, paroxysmal nocturnal dyspnea. Dermatological: No rash, lesions/masses Respiratory: No cough, dyspnea Urologic: No hematuria, dysuria Abdominal:   No nausea, vomiting, diarrhea, bright red blood per rectum, melena, or hematemesis Neurologic:  No visual changes, wkns, changes in mental status. All other systems reviewed and are otherwise negative except as noted above.  Physical Exam    VS:   There were no vitals taken for this visit. , BMI There is no height or weight on file to calculate BMI. GEN: Well nourished, well developed, in no acute distress. HEENT: normal. Neck: Supple, no JVD, carotid bruits,  or masses. Cardiac: RRR, no murmurs, rubs, or gallops. No clubbing, cyanosis, edema.  Radials/DP/PT 2+ and equal bilaterally.  Respiratory:  Respirations regular and unlabored, clear to auscultation bilaterally. GI: Soft, nontender, nondistended, BS + x 4. MS: no deformity or atrophy. Skin: warm and dry, no rash. Neuro:  Strength and sensation are intact. Psych: Normal affect.  Accessory Clinical Findings    Recent Labs: No results found for requested labs within last 8760 hours.   Recent Lipid Panel    Component Value Date/Time   CHOL 164 10/19/2016 0353   TRIG 260 (H) 10/19/2016 0353   HDL 46 10/19/2016 0353   CHOLHDL 3.6 10/19/2016 0353   VLDL 52 (H) 10/19/2016 0353   LDLCALC 66 10/19/2016 0353   LDLDIRECT 121.5 09/28/2010 0914    ECG personally reviewed by me today-***   EKG 08/18/2021 normal sinus rhythm left ventricular hypertrophy with repolarization abnormality 78 bpm- No acute changes  Echocardiogram 06/27/2021 Left Ventricle: Systolic function is normal. EF: 60-65%.    Left Ventricle: Wall thickness is normal.    Left Ventricle: Left ventricle size is normal.    Left Ventricle: No regional wall motion abnormalities noted.    Left Ventricle: Doppler parameters indicate normal diastolic function.    Left Atrium: Left atrium is mildly dilated. Left atrium volume index is  normal (16-34 mL/m2).    Right Ventricle: Normal tricuspid annular plane systolic excursion  (TAPSE) >1.7 cm.    Right Ventricle: Right ventricle is normal.    Aortic Valve: Trace aortic valve regurgitation.    Mitral Valve: There is mild regurgitation with a centrally directed  jet.    Tricuspid Valve: The right ventricular systolic pressure is mildly  elevated (37-49 mmHg).     Tricuspid Valve: There is mild regurgitation.  Nuclear stress test 06/29/2021 TECHNIQUE: NM HEART SPECT PH STRESS (R). Comparison none. 11 mCi technetium Cardiolite administered for the rest portion of the study and 33 mCi for the stress portion of the study. SPECT images were obtained.   FINDINGS: Ejection fraction calculates to 72%. End-diastolic volume is 56 mL and systolic volume is 16 mL. Normal wall motion. No ischemia.  Assessment & Plan   1.   Essential hypertension-BP today 142/88***.  Well-controlled at home 135/70-80.  Previously noted to have whitecoat hypertension. Continue metoprolol, valsartan Heart healthy low-sodium diet Increase physical activity as tolerated Continue BP log  Paroxysmal atrial fibrillation-heart rate today***.  Continues to deny  episodes of irregular or accelerated heartbeat.    Continues compliance with apixaban and denies bleeding issues. Continue metoprolol tartrate, apixaban Heart healthy low-sodium diet-salty 6 given Increase physical activity as tolerated Avoid triggers caffeine, chocolate, EtOH, dehydration etc.-reviewed  Coronary artery disease-no chest pain today.  Nuclear stress test showed no ischemia. Continue metoprolol, rosuvastatin Heart healthy low-sodium diet-salty 6 given Increase physical activity as tolerated  Hyperlipidemia-LDL 66 10/19/2016*** Continue rosuvastatin Heart healthy low-sodium high-fiber diet Increase physical activity as tolerated Follows with PCP  Disposition: Follow-up with Dr. Martinique in 6 months.  Jossie Ng. Cobie Leidner NP-C    11/28/2021, 7:37 AM [place] [address] Office [phone] Fax [phone]  Notice: This dictation was prepared with Dragon dictation along with smaller phrase technology. Any transcriptional errors that result from this process are unintentional and may not be corrected upon review.  I spent 13 minutes examining this patient, reviewing  medications, and using patient centered shared decision making involving her cardiac care.  Prior to her visit I spent  greater than 20 minutes reviewing her past medical history,  medications, and prior cardiac tests.

## 2021-12-01 ENCOUNTER — Ambulatory Visit: Payer: Medicare Other | Admitting: General Practice

## 2021-12-04 DIAGNOSIS — Z79890 Hormone replacement therapy (postmenopausal): Secondary | ICD-10-CM | POA: Diagnosis not present

## 2021-12-04 DIAGNOSIS — R197 Diarrhea, unspecified: Secondary | ICD-10-CM | POA: Diagnosis not present

## 2021-12-04 DIAGNOSIS — R110 Nausea: Secondary | ICD-10-CM | POA: Diagnosis not present

## 2021-12-04 DIAGNOSIS — E039 Hypothyroidism, unspecified: Secondary | ICD-10-CM | POA: Diagnosis present

## 2021-12-04 DIAGNOSIS — R0789 Other chest pain: Secondary | ICD-10-CM | POA: Diagnosis not present

## 2021-12-04 DIAGNOSIS — R8281 Pyuria: Secondary | ICD-10-CM | POA: Diagnosis present

## 2021-12-04 DIAGNOSIS — R079 Chest pain, unspecified: Secondary | ICD-10-CM | POA: Diagnosis present

## 2021-12-04 DIAGNOSIS — Z955 Presence of coronary angioplasty implant and graft: Secondary | ICD-10-CM | POA: Diagnosis not present

## 2021-12-04 DIAGNOSIS — K219 Gastro-esophageal reflux disease without esophagitis: Secondary | ICD-10-CM | POA: Diagnosis present

## 2021-12-04 DIAGNOSIS — R072 Precordial pain: Secondary | ICD-10-CM | POA: Diagnosis not present

## 2021-12-04 DIAGNOSIS — I10 Essential (primary) hypertension: Secondary | ICD-10-CM | POA: Diagnosis not present

## 2021-12-04 DIAGNOSIS — Z881 Allergy status to other antibiotic agents status: Secondary | ICD-10-CM | POA: Diagnosis not present

## 2021-12-04 DIAGNOSIS — I2510 Atherosclerotic heart disease of native coronary artery without angina pectoris: Secondary | ICD-10-CM | POA: Diagnosis not present

## 2021-12-04 DIAGNOSIS — Z66 Do not resuscitate: Secondary | ICD-10-CM | POA: Diagnosis present

## 2021-12-04 DIAGNOSIS — I159 Secondary hypertension, unspecified: Secondary | ICD-10-CM | POA: Diagnosis not present

## 2021-12-04 DIAGNOSIS — E876 Hypokalemia: Secondary | ICD-10-CM | POA: Diagnosis present

## 2021-12-04 DIAGNOSIS — I083 Combined rheumatic disorders of mitral, aortic and tricuspid valves: Secondary | ICD-10-CM | POA: Diagnosis not present

## 2021-12-04 DIAGNOSIS — I517 Cardiomegaly: Secondary | ICD-10-CM | POA: Diagnosis not present

## 2021-12-04 DIAGNOSIS — I119 Hypertensive heart disease without heart failure: Secondary | ICD-10-CM | POA: Diagnosis present

## 2021-12-04 DIAGNOSIS — R531 Weakness: Secondary | ICD-10-CM | POA: Diagnosis not present

## 2021-12-04 DIAGNOSIS — I480 Paroxysmal atrial fibrillation: Secondary | ICD-10-CM | POA: Diagnosis present

## 2021-12-04 DIAGNOSIS — I160 Hypertensive urgency: Secondary | ICD-10-CM | POA: Diagnosis not present

## 2021-12-04 DIAGNOSIS — R7303 Prediabetes: Secondary | ICD-10-CM | POA: Diagnosis present

## 2021-12-04 DIAGNOSIS — E781 Pure hyperglyceridemia: Secondary | ICD-10-CM | POA: Diagnosis not present

## 2021-12-04 DIAGNOSIS — R011 Cardiac murmur, unspecified: Secondary | ICD-10-CM | POA: Diagnosis present

## 2021-12-04 DIAGNOSIS — Z886 Allergy status to analgesic agent status: Secondary | ICD-10-CM | POA: Diagnosis not present

## 2021-12-04 DIAGNOSIS — Z79899 Other long term (current) drug therapy: Secondary | ICD-10-CM | POA: Diagnosis not present

## 2021-12-04 DIAGNOSIS — D649 Anemia, unspecified: Secondary | ICD-10-CM | POA: Diagnosis present

## 2021-12-04 DIAGNOSIS — I25110 Atherosclerotic heart disease of native coronary artery with unstable angina pectoris: Secondary | ICD-10-CM | POA: Diagnosis present

## 2021-12-04 DIAGNOSIS — F419 Anxiety disorder, unspecified: Secondary | ICD-10-CM | POA: Diagnosis not present

## 2021-12-04 DIAGNOSIS — Z7901 Long term (current) use of anticoagulants: Secondary | ICD-10-CM | POA: Diagnosis not present

## 2021-12-04 DIAGNOSIS — I25118 Atherosclerotic heart disease of native coronary artery with other forms of angina pectoris: Secondary | ICD-10-CM | POA: Diagnosis not present

## 2021-12-05 DIAGNOSIS — R531 Weakness: Secondary | ICD-10-CM | POA: Diagnosis not present

## 2021-12-05 DIAGNOSIS — I517 Cardiomegaly: Secondary | ICD-10-CM | POA: Diagnosis not present

## 2021-12-05 DIAGNOSIS — I083 Combined rheumatic disorders of mitral, aortic and tricuspid valves: Secondary | ICD-10-CM | POA: Diagnosis not present

## 2021-12-05 DIAGNOSIS — R0789 Other chest pain: Secondary | ICD-10-CM | POA: Diagnosis not present

## 2021-12-05 DIAGNOSIS — R072 Precordial pain: Secondary | ICD-10-CM | POA: Diagnosis not present

## 2021-12-05 DIAGNOSIS — R079 Chest pain, unspecified: Secondary | ICD-10-CM | POA: Diagnosis not present

## 2021-12-05 DIAGNOSIS — I480 Paroxysmal atrial fibrillation: Secondary | ICD-10-CM | POA: Diagnosis not present

## 2021-12-05 DIAGNOSIS — D649 Anemia, unspecified: Secondary | ICD-10-CM | POA: Diagnosis not present

## 2021-12-05 DIAGNOSIS — I160 Hypertensive urgency: Secondary | ICD-10-CM | POA: Diagnosis not present

## 2021-12-05 DIAGNOSIS — I10 Essential (primary) hypertension: Secondary | ICD-10-CM | POA: Diagnosis not present

## 2021-12-06 DIAGNOSIS — R8281 Pyuria: Secondary | ICD-10-CM | POA: Diagnosis present

## 2021-12-06 DIAGNOSIS — I160 Hypertensive urgency: Secondary | ICD-10-CM | POA: Diagnosis not present

## 2021-12-06 DIAGNOSIS — R079 Chest pain, unspecified: Secondary | ICD-10-CM | POA: Diagnosis present

## 2021-12-06 DIAGNOSIS — E781 Pure hyperglyceridemia: Secondary | ICD-10-CM | POA: Diagnosis not present

## 2021-12-06 DIAGNOSIS — R110 Nausea: Secondary | ICD-10-CM | POA: Diagnosis not present

## 2021-12-06 DIAGNOSIS — D649 Anemia, unspecified: Secondary | ICD-10-CM | POA: Diagnosis present

## 2021-12-06 DIAGNOSIS — Z79899 Other long term (current) drug therapy: Secondary | ICD-10-CM | POA: Diagnosis not present

## 2021-12-06 DIAGNOSIS — K219 Gastro-esophageal reflux disease without esophagitis: Secondary | ICD-10-CM | POA: Diagnosis present

## 2021-12-06 DIAGNOSIS — I159 Secondary hypertension, unspecified: Secondary | ICD-10-CM | POA: Diagnosis not present

## 2021-12-06 DIAGNOSIS — Z886 Allergy status to analgesic agent status: Secondary | ICD-10-CM | POA: Diagnosis not present

## 2021-12-06 DIAGNOSIS — I10 Essential (primary) hypertension: Secondary | ICD-10-CM | POA: Diagnosis not present

## 2021-12-06 DIAGNOSIS — I480 Paroxysmal atrial fibrillation: Secondary | ICD-10-CM | POA: Diagnosis present

## 2021-12-06 DIAGNOSIS — R197 Diarrhea, unspecified: Secondary | ICD-10-CM | POA: Diagnosis not present

## 2021-12-06 DIAGNOSIS — I119 Hypertensive heart disease without heart failure: Secondary | ICD-10-CM | POA: Diagnosis present

## 2021-12-06 DIAGNOSIS — Z79890 Hormone replacement therapy (postmenopausal): Secondary | ICD-10-CM | POA: Diagnosis not present

## 2021-12-06 DIAGNOSIS — I2510 Atherosclerotic heart disease of native coronary artery without angina pectoris: Secondary | ICD-10-CM | POA: Diagnosis not present

## 2021-12-06 DIAGNOSIS — F419 Anxiety disorder, unspecified: Secondary | ICD-10-CM | POA: Diagnosis not present

## 2021-12-06 DIAGNOSIS — Z955 Presence of coronary angioplasty implant and graft: Secondary | ICD-10-CM | POA: Diagnosis not present

## 2021-12-06 DIAGNOSIS — R072 Precordial pain: Secondary | ICD-10-CM | POA: Diagnosis not present

## 2021-12-06 DIAGNOSIS — E039 Hypothyroidism, unspecified: Secondary | ICD-10-CM | POA: Diagnosis present

## 2021-12-06 DIAGNOSIS — Z881 Allergy status to other antibiotic agents status: Secondary | ICD-10-CM | POA: Diagnosis not present

## 2021-12-06 DIAGNOSIS — I25118 Atherosclerotic heart disease of native coronary artery with other forms of angina pectoris: Secondary | ICD-10-CM | POA: Diagnosis not present

## 2021-12-06 DIAGNOSIS — Z66 Do not resuscitate: Secondary | ICD-10-CM | POA: Diagnosis present

## 2021-12-06 DIAGNOSIS — I25110 Atherosclerotic heart disease of native coronary artery with unstable angina pectoris: Secondary | ICD-10-CM | POA: Diagnosis present

## 2021-12-06 DIAGNOSIS — R011 Cardiac murmur, unspecified: Secondary | ICD-10-CM | POA: Diagnosis present

## 2021-12-06 DIAGNOSIS — I517 Cardiomegaly: Secondary | ICD-10-CM | POA: Diagnosis not present

## 2021-12-06 DIAGNOSIS — R7303 Prediabetes: Secondary | ICD-10-CM | POA: Diagnosis present

## 2021-12-06 DIAGNOSIS — I083 Combined rheumatic disorders of mitral, aortic and tricuspid valves: Secondary | ICD-10-CM | POA: Diagnosis not present

## 2021-12-06 DIAGNOSIS — E876 Hypokalemia: Secondary | ICD-10-CM | POA: Diagnosis present

## 2021-12-06 DIAGNOSIS — Z7901 Long term (current) use of anticoagulants: Secondary | ICD-10-CM | POA: Diagnosis not present

## 2021-12-07 DIAGNOSIS — D649 Anemia, unspecified: Secondary | ICD-10-CM | POA: Diagnosis not present

## 2021-12-07 DIAGNOSIS — E039 Hypothyroidism, unspecified: Secondary | ICD-10-CM | POA: Diagnosis not present

## 2021-12-07 DIAGNOSIS — R079 Chest pain, unspecified: Secondary | ICD-10-CM | POA: Diagnosis not present

## 2021-12-07 DIAGNOSIS — I25118 Atherosclerotic heart disease of native coronary artery with other forms of angina pectoris: Secondary | ICD-10-CM | POA: Diagnosis not present

## 2021-12-07 DIAGNOSIS — I10 Essential (primary) hypertension: Secondary | ICD-10-CM | POA: Diagnosis not present

## 2021-12-07 DIAGNOSIS — I480 Paroxysmal atrial fibrillation: Secondary | ICD-10-CM | POA: Diagnosis not present

## 2021-12-08 DIAGNOSIS — I480 Paroxysmal atrial fibrillation: Secondary | ICD-10-CM | POA: Diagnosis not present

## 2021-12-08 DIAGNOSIS — D649 Anemia, unspecified: Secondary | ICD-10-CM | POA: Diagnosis not present

## 2021-12-08 DIAGNOSIS — I10 Essential (primary) hypertension: Secondary | ICD-10-CM | POA: Diagnosis not present

## 2021-12-08 DIAGNOSIS — R079 Chest pain, unspecified: Secondary | ICD-10-CM | POA: Diagnosis not present

## 2021-12-08 DIAGNOSIS — E039 Hypothyroidism, unspecified: Secondary | ICD-10-CM | POA: Diagnosis not present

## 2021-12-08 DIAGNOSIS — I25118 Atherosclerotic heart disease of native coronary artery with other forms of angina pectoris: Secondary | ICD-10-CM | POA: Diagnosis not present

## 2021-12-09 DIAGNOSIS — R079 Chest pain, unspecified: Secondary | ICD-10-CM | POA: Diagnosis not present

## 2021-12-09 DIAGNOSIS — D649 Anemia, unspecified: Secondary | ICD-10-CM | POA: Diagnosis not present

## 2021-12-09 DIAGNOSIS — I10 Essential (primary) hypertension: Secondary | ICD-10-CM | POA: Diagnosis not present

## 2021-12-09 DIAGNOSIS — E039 Hypothyroidism, unspecified: Secondary | ICD-10-CM | POA: Diagnosis not present

## 2021-12-09 DIAGNOSIS — I25118 Atherosclerotic heart disease of native coronary artery with other forms of angina pectoris: Secondary | ICD-10-CM | POA: Diagnosis not present

## 2021-12-09 DIAGNOSIS — I480 Paroxysmal atrial fibrillation: Secondary | ICD-10-CM | POA: Diagnosis not present

## 2021-12-16 DIAGNOSIS — I2510 Atherosclerotic heart disease of native coronary artery without angina pectoris: Secondary | ICD-10-CM | POA: Diagnosis not present

## 2021-12-16 DIAGNOSIS — E876 Hypokalemia: Secondary | ICD-10-CM | POA: Diagnosis not present

## 2021-12-16 DIAGNOSIS — I272 Pulmonary hypertension, unspecified: Secondary | ICD-10-CM | POA: Diagnosis not present

## 2021-12-16 DIAGNOSIS — T45515A Adverse effect of anticoagulants, initial encounter: Secondary | ICD-10-CM | POA: Diagnosis not present

## 2021-12-16 DIAGNOSIS — Z885 Allergy status to narcotic agent status: Secondary | ICD-10-CM | POA: Diagnosis not present

## 2021-12-16 DIAGNOSIS — Z66 Do not resuscitate: Secondary | ICD-10-CM | POA: Diagnosis not present

## 2021-12-16 DIAGNOSIS — Z882 Allergy status to sulfonamides status: Secondary | ICD-10-CM | POA: Diagnosis not present

## 2021-12-16 DIAGNOSIS — Z9861 Coronary angioplasty status: Secondary | ICD-10-CM | POA: Diagnosis not present

## 2021-12-16 DIAGNOSIS — Z7982 Long term (current) use of aspirin: Secondary | ICD-10-CM | POA: Diagnosis not present

## 2021-12-16 DIAGNOSIS — K254 Chronic or unspecified gastric ulcer with hemorrhage: Secondary | ICD-10-CM | POA: Diagnosis not present

## 2021-12-16 DIAGNOSIS — K449 Diaphragmatic hernia without obstruction or gangrene: Secondary | ICD-10-CM | POA: Diagnosis not present

## 2021-12-16 DIAGNOSIS — K219 Gastro-esophageal reflux disease without esophagitis: Secondary | ICD-10-CM | POA: Diagnosis not present

## 2021-12-16 DIAGNOSIS — K922 Gastrointestinal hemorrhage, unspecified: Secondary | ICD-10-CM | POA: Diagnosis not present

## 2021-12-16 DIAGNOSIS — E785 Hyperlipidemia, unspecified: Secondary | ICD-10-CM | POA: Diagnosis not present

## 2021-12-16 DIAGNOSIS — R710 Precipitous drop in hematocrit: Secondary | ICD-10-CM | POA: Diagnosis not present

## 2021-12-16 DIAGNOSIS — Z888 Allergy status to other drugs, medicaments and biological substances status: Secondary | ICD-10-CM | POA: Diagnosis not present

## 2021-12-16 DIAGNOSIS — I10 Essential (primary) hypertension: Secondary | ICD-10-CM | POA: Diagnosis not present

## 2021-12-16 DIAGNOSIS — E7800 Pure hypercholesterolemia, unspecified: Secondary | ICD-10-CM | POA: Diagnosis not present

## 2021-12-16 DIAGNOSIS — I480 Paroxysmal atrial fibrillation: Secondary | ICD-10-CM | POA: Diagnosis not present

## 2021-12-16 DIAGNOSIS — Z955 Presence of coronary angioplasty implant and graft: Secondary | ICD-10-CM | POA: Diagnosis not present

## 2021-12-16 DIAGNOSIS — E039 Hypothyroidism, unspecified: Secondary | ICD-10-CM | POA: Diagnosis not present

## 2021-12-16 DIAGNOSIS — M810 Age-related osteoporosis without current pathological fracture: Secondary | ICD-10-CM | POA: Diagnosis not present

## 2021-12-16 DIAGNOSIS — Z79890 Hormone replacement therapy (postmenopausal): Secondary | ICD-10-CM | POA: Diagnosis not present

## 2021-12-16 DIAGNOSIS — D62 Acute posthemorrhagic anemia: Secondary | ICD-10-CM | POA: Diagnosis not present

## 2021-12-16 DIAGNOSIS — Z880 Allergy status to penicillin: Secondary | ICD-10-CM | POA: Diagnosis not present

## 2021-12-16 DIAGNOSIS — R531 Weakness: Secondary | ICD-10-CM | POA: Diagnosis not present

## 2021-12-16 DIAGNOSIS — K3189 Other diseases of stomach and duodenum: Secondary | ICD-10-CM | POA: Diagnosis not present

## 2021-12-16 DIAGNOSIS — Z79899 Other long term (current) drug therapy: Secondary | ICD-10-CM | POA: Diagnosis not present

## 2021-12-16 DIAGNOSIS — Z7901 Long term (current) use of anticoagulants: Secondary | ICD-10-CM | POA: Diagnosis not present

## 2021-12-16 DIAGNOSIS — K921 Melena: Secondary | ICD-10-CM | POA: Diagnosis not present

## 2021-12-22 DIAGNOSIS — D500 Iron deficiency anemia secondary to blood loss (chronic): Secondary | ICD-10-CM | POA: Diagnosis not present

## 2021-12-22 DIAGNOSIS — Z66 Do not resuscitate: Secondary | ICD-10-CM | POA: Diagnosis not present

## 2021-12-22 DIAGNOSIS — I2510 Atherosclerotic heart disease of native coronary artery without angina pectoris: Secondary | ICD-10-CM | POA: Diagnosis not present

## 2021-12-22 DIAGNOSIS — Z79899 Other long term (current) drug therapy: Secondary | ICD-10-CM | POA: Diagnosis not present

## 2021-12-22 DIAGNOSIS — K250 Acute gastric ulcer with hemorrhage: Secondary | ICD-10-CM | POA: Diagnosis not present

## 2021-12-22 DIAGNOSIS — Z09 Encounter for follow-up examination after completed treatment for conditions other than malignant neoplasm: Secondary | ICD-10-CM | POA: Diagnosis not present

## 2021-12-22 DIAGNOSIS — D62 Acute posthemorrhagic anemia: Secondary | ICD-10-CM | POA: Diagnosis not present

## 2021-12-22 DIAGNOSIS — K254 Chronic or unspecified gastric ulcer with hemorrhage: Secondary | ICD-10-CM | POA: Diagnosis not present

## 2021-12-23 DIAGNOSIS — Z955 Presence of coronary angioplasty implant and graft: Secondary | ICD-10-CM | POA: Diagnosis not present

## 2021-12-23 DIAGNOSIS — I10 Essential (primary) hypertension: Secondary | ICD-10-CM | POA: Diagnosis not present

## 2021-12-23 DIAGNOSIS — I480 Paroxysmal atrial fibrillation: Secondary | ICD-10-CM | POA: Diagnosis not present

## 2021-12-23 DIAGNOSIS — D62 Acute posthemorrhagic anemia: Secondary | ICD-10-CM | POA: Diagnosis not present

## 2021-12-23 DIAGNOSIS — E785 Hyperlipidemia, unspecified: Secondary | ICD-10-CM | POA: Diagnosis not present

## 2021-12-23 DIAGNOSIS — I2510 Atherosclerotic heart disease of native coronary artery without angina pectoris: Secondary | ICD-10-CM | POA: Diagnosis not present

## 2021-12-29 DIAGNOSIS — D500 Iron deficiency anemia secondary to blood loss (chronic): Secondary | ICD-10-CM | POA: Diagnosis not present

## 2022-01-01 ENCOUNTER — Ambulatory Visit: Payer: Medicare Other | Admitting: General Practice

## 2022-01-05 DIAGNOSIS — D500 Iron deficiency anemia secondary to blood loss (chronic): Secondary | ICD-10-CM | POA: Diagnosis not present

## 2022-01-12 DIAGNOSIS — D500 Iron deficiency anemia secondary to blood loss (chronic): Secondary | ICD-10-CM | POA: Diagnosis not present

## 2022-01-19 DIAGNOSIS — D500 Iron deficiency anemia secondary to blood loss (chronic): Secondary | ICD-10-CM | POA: Diagnosis not present

## 2022-02-03 DIAGNOSIS — K254 Chronic or unspecified gastric ulcer with hemorrhage: Secondary | ICD-10-CM | POA: Diagnosis not present

## 2022-02-03 DIAGNOSIS — I2510 Atherosclerotic heart disease of native coronary artery without angina pectoris: Secondary | ICD-10-CM | POA: Diagnosis not present

## 2022-02-03 DIAGNOSIS — I480 Paroxysmal atrial fibrillation: Secondary | ICD-10-CM | POA: Diagnosis not present

## 2022-02-04 DIAGNOSIS — K254 Chronic or unspecified gastric ulcer with hemorrhage: Secondary | ICD-10-CM | POA: Diagnosis not present

## 2022-02-23 DIAGNOSIS — E876 Hypokalemia: Secondary | ICD-10-CM | POA: Diagnosis not present

## 2022-02-23 DIAGNOSIS — R531 Weakness: Secondary | ICD-10-CM | POA: Diagnosis not present

## 2022-02-23 DIAGNOSIS — Z20822 Contact with and (suspected) exposure to covid-19: Secondary | ICD-10-CM | POA: Diagnosis not present

## 2022-02-23 DIAGNOSIS — I517 Cardiomegaly: Secondary | ICD-10-CM | POA: Diagnosis not present

## 2022-04-13 DIAGNOSIS — Z961 Presence of intraocular lens: Secondary | ICD-10-CM | POA: Diagnosis not present

## 2022-04-13 DIAGNOSIS — H52223 Regular astigmatism, bilateral: Secondary | ICD-10-CM | POA: Diagnosis not present

## 2022-04-13 DIAGNOSIS — H524 Presbyopia: Secondary | ICD-10-CM | POA: Diagnosis not present

## 2022-05-11 DIAGNOSIS — M810 Age-related osteoporosis without current pathological fracture: Secondary | ICD-10-CM | POA: Diagnosis not present

## 2022-05-11 DIAGNOSIS — E7800 Pure hypercholesterolemia, unspecified: Secondary | ICD-10-CM | POA: Diagnosis not present

## 2022-05-11 DIAGNOSIS — I10 Essential (primary) hypertension: Secondary | ICD-10-CM | POA: Diagnosis not present

## 2022-05-11 DIAGNOSIS — E039 Hypothyroidism, unspecified: Secondary | ICD-10-CM | POA: Diagnosis not present

## 2022-05-11 DIAGNOSIS — K219 Gastro-esophageal reflux disease without esophagitis: Secondary | ICD-10-CM | POA: Diagnosis not present

## 2022-05-24 DIAGNOSIS — R531 Weakness: Secondary | ICD-10-CM | POA: Diagnosis present

## 2022-05-24 DIAGNOSIS — I288 Other diseases of pulmonary vessels: Secondary | ICD-10-CM | POA: Diagnosis not present

## 2022-05-24 DIAGNOSIS — E785 Hyperlipidemia, unspecified: Secondary | ICD-10-CM | POA: Diagnosis not present

## 2022-05-24 DIAGNOSIS — E1165 Type 2 diabetes mellitus with hyperglycemia: Secondary | ICD-10-CM | POA: Diagnosis present

## 2022-05-24 DIAGNOSIS — E876 Hypokalemia: Secondary | ICD-10-CM | POA: Diagnosis present

## 2022-05-24 DIAGNOSIS — Z955 Presence of coronary angioplasty implant and graft: Secondary | ICD-10-CM | POA: Diagnosis not present

## 2022-05-24 DIAGNOSIS — I5033 Acute on chronic diastolic (congestive) heart failure: Secondary | ICD-10-CM | POA: Diagnosis present

## 2022-05-24 DIAGNOSIS — I491 Atrial premature depolarization: Secondary | ICD-10-CM | POA: Diagnosis not present

## 2022-05-24 DIAGNOSIS — I6503 Occlusion and stenosis of bilateral vertebral arteries: Secondary | ICD-10-CM | POA: Diagnosis not present

## 2022-05-24 DIAGNOSIS — I272 Pulmonary hypertension, unspecified: Secondary | ICD-10-CM | POA: Diagnosis present

## 2022-05-24 DIAGNOSIS — M810 Age-related osteoporosis without current pathological fracture: Secondary | ICD-10-CM | POA: Diagnosis present

## 2022-05-24 DIAGNOSIS — J90 Pleural effusion, not elsewhere classified: Secondary | ICD-10-CM | POA: Diagnosis not present

## 2022-05-24 DIAGNOSIS — Z882 Allergy status to sulfonamides status: Secondary | ICD-10-CM | POA: Diagnosis not present

## 2022-05-24 DIAGNOSIS — D649 Anemia, unspecified: Secondary | ICD-10-CM | POA: Diagnosis present

## 2022-05-24 DIAGNOSIS — I110 Hypertensive heart disease with heart failure: Secondary | ICD-10-CM | POA: Diagnosis present

## 2022-05-24 DIAGNOSIS — I252 Old myocardial infarction: Secondary | ICD-10-CM | POA: Diagnosis not present

## 2022-05-24 DIAGNOSIS — R197 Diarrhea, unspecified: Secondary | ICD-10-CM | POA: Diagnosis present

## 2022-05-24 DIAGNOSIS — R4182 Altered mental status, unspecified: Secondary | ICD-10-CM | POA: Diagnosis not present

## 2022-05-24 DIAGNOSIS — I480 Paroxysmal atrial fibrillation: Secondary | ICD-10-CM | POA: Diagnosis not present

## 2022-05-24 DIAGNOSIS — I6522 Occlusion and stenosis of left carotid artery: Secondary | ICD-10-CM | POA: Diagnosis present

## 2022-05-24 DIAGNOSIS — Z888 Allergy status to other drugs, medicaments and biological substances status: Secondary | ICD-10-CM | POA: Diagnosis not present

## 2022-05-24 DIAGNOSIS — R002 Palpitations: Secondary | ICD-10-CM | POA: Diagnosis not present

## 2022-05-24 DIAGNOSIS — I088 Other rheumatic multiple valve diseases: Secondary | ICD-10-CM | POA: Diagnosis not present

## 2022-05-24 DIAGNOSIS — Z885 Allergy status to narcotic agent status: Secondary | ICD-10-CM | POA: Diagnosis not present

## 2022-05-24 DIAGNOSIS — J9811 Atelectasis: Secondary | ICD-10-CM | POA: Diagnosis not present

## 2022-05-24 DIAGNOSIS — I10 Essential (primary) hypertension: Secondary | ICD-10-CM | POA: Diagnosis not present

## 2022-05-24 DIAGNOSIS — R9431 Abnormal electrocardiogram [ECG] [EKG]: Secondary | ICD-10-CM | POA: Diagnosis present

## 2022-05-24 DIAGNOSIS — I517 Cardiomegaly: Secondary | ICD-10-CM | POA: Diagnosis not present

## 2022-05-24 DIAGNOSIS — I2510 Atherosclerotic heart disease of native coronary artery without angina pectoris: Secondary | ICD-10-CM | POA: Diagnosis not present

## 2022-05-24 DIAGNOSIS — I071 Rheumatic tricuspid insufficiency: Secondary | ICD-10-CM | POA: Diagnosis present

## 2022-05-24 DIAGNOSIS — E878 Other disorders of electrolyte and fluid balance, not elsewhere classified: Secondary | ICD-10-CM | POA: Diagnosis not present

## 2022-05-24 DIAGNOSIS — R42 Dizziness and giddiness: Secondary | ICD-10-CM | POA: Diagnosis not present

## 2022-05-24 DIAGNOSIS — K219 Gastro-esophageal reflux disease without esophagitis: Secondary | ICD-10-CM | POA: Diagnosis present

## 2022-05-24 DIAGNOSIS — Z90710 Acquired absence of both cervix and uterus: Secondary | ICD-10-CM | POA: Diagnosis not present

## 2022-05-24 DIAGNOSIS — E039 Hypothyroidism, unspecified: Secondary | ICD-10-CM | POA: Diagnosis not present

## 2022-05-24 DIAGNOSIS — R0789 Other chest pain: Secondary | ICD-10-CM | POA: Diagnosis present

## 2022-05-24 DIAGNOSIS — Z7901 Long term (current) use of anticoagulants: Secondary | ICD-10-CM | POA: Diagnosis not present

## 2022-05-24 DIAGNOSIS — R079 Chest pain, unspecified: Secondary | ICD-10-CM | POA: Diagnosis not present

## 2022-05-25 DIAGNOSIS — I2510 Atherosclerotic heart disease of native coronary artery without angina pectoris: Secondary | ICD-10-CM | POA: Diagnosis not present

## 2022-05-25 DIAGNOSIS — I088 Other rheumatic multiple valve diseases: Secondary | ICD-10-CM | POA: Diagnosis not present

## 2022-05-25 DIAGNOSIS — I288 Other diseases of pulmonary vessels: Secondary | ICD-10-CM | POA: Diagnosis not present

## 2022-05-25 DIAGNOSIS — Z7901 Long term (current) use of anticoagulants: Secondary | ICD-10-CM | POA: Diagnosis not present

## 2022-05-25 DIAGNOSIS — E785 Hyperlipidemia, unspecified: Secondary | ICD-10-CM | POA: Diagnosis not present

## 2022-05-25 DIAGNOSIS — E878 Other disorders of electrolyte and fluid balance, not elsewhere classified: Secondary | ICD-10-CM | POA: Diagnosis not present

## 2022-05-25 DIAGNOSIS — E039 Hypothyroidism, unspecified: Secondary | ICD-10-CM | POA: Diagnosis not present

## 2022-05-25 DIAGNOSIS — K219 Gastro-esophageal reflux disease without esophagitis: Secondary | ICD-10-CM | POA: Diagnosis not present

## 2022-05-25 DIAGNOSIS — I10 Essential (primary) hypertension: Secondary | ICD-10-CM | POA: Diagnosis not present

## 2022-05-25 DIAGNOSIS — I480 Paroxysmal atrial fibrillation: Secondary | ICD-10-CM | POA: Diagnosis not present

## 2022-05-25 DIAGNOSIS — J90 Pleural effusion, not elsewhere classified: Secondary | ICD-10-CM | POA: Diagnosis not present

## 2022-05-25 DIAGNOSIS — J9811 Atelectasis: Secondary | ICD-10-CM | POA: Diagnosis not present

## 2022-05-25 DIAGNOSIS — Z955 Presence of coronary angioplasty implant and graft: Secondary | ICD-10-CM | POA: Diagnosis not present

## 2022-05-25 DIAGNOSIS — E876 Hypokalemia: Secondary | ICD-10-CM | POA: Diagnosis not present

## 2022-05-26 DIAGNOSIS — K219 Gastro-esophageal reflux disease without esophagitis: Secondary | ICD-10-CM | POA: Diagnosis present

## 2022-05-26 DIAGNOSIS — Z882 Allergy status to sulfonamides status: Secondary | ICD-10-CM | POA: Diagnosis not present

## 2022-05-26 DIAGNOSIS — J90 Pleural effusion, not elsewhere classified: Secondary | ICD-10-CM | POA: Diagnosis not present

## 2022-05-26 DIAGNOSIS — I480 Paroxysmal atrial fibrillation: Secondary | ICD-10-CM | POA: Diagnosis present

## 2022-05-26 DIAGNOSIS — I252 Old myocardial infarction: Secondary | ICD-10-CM | POA: Diagnosis not present

## 2022-05-26 DIAGNOSIS — R531 Weakness: Secondary | ICD-10-CM | POA: Diagnosis present

## 2022-05-26 DIAGNOSIS — R9431 Abnormal electrocardiogram [ECG] [EKG]: Secondary | ICD-10-CM | POA: Diagnosis present

## 2022-05-26 DIAGNOSIS — R197 Diarrhea, unspecified: Secondary | ICD-10-CM | POA: Diagnosis present

## 2022-05-26 DIAGNOSIS — Z7901 Long term (current) use of anticoagulants: Secondary | ICD-10-CM | POA: Diagnosis not present

## 2022-05-26 DIAGNOSIS — M810 Age-related osteoporosis without current pathological fracture: Secondary | ICD-10-CM | POA: Diagnosis present

## 2022-05-26 DIAGNOSIS — E1165 Type 2 diabetes mellitus with hyperglycemia: Secondary | ICD-10-CM | POA: Diagnosis present

## 2022-05-26 DIAGNOSIS — I10 Essential (primary) hypertension: Secondary | ICD-10-CM | POA: Diagnosis not present

## 2022-05-26 DIAGNOSIS — E878 Other disorders of electrolyte and fluid balance, not elsewhere classified: Secondary | ICD-10-CM | POA: Diagnosis not present

## 2022-05-26 DIAGNOSIS — I5033 Acute on chronic diastolic (congestive) heart failure: Secondary | ICD-10-CM | POA: Diagnosis present

## 2022-05-26 DIAGNOSIS — I071 Rheumatic tricuspid insufficiency: Secondary | ICD-10-CM | POA: Diagnosis present

## 2022-05-26 DIAGNOSIS — E876 Hypokalemia: Secondary | ICD-10-CM | POA: Diagnosis present

## 2022-05-26 DIAGNOSIS — J9811 Atelectasis: Secondary | ICD-10-CM | POA: Diagnosis not present

## 2022-05-26 DIAGNOSIS — R0789 Other chest pain: Secondary | ICD-10-CM | POA: Diagnosis present

## 2022-05-26 DIAGNOSIS — I517 Cardiomegaly: Secondary | ICD-10-CM | POA: Diagnosis not present

## 2022-05-26 DIAGNOSIS — I110 Hypertensive heart disease with heart failure: Secondary | ICD-10-CM | POA: Diagnosis present

## 2022-05-26 DIAGNOSIS — Z888 Allergy status to other drugs, medicaments and biological substances status: Secondary | ICD-10-CM | POA: Diagnosis not present

## 2022-05-26 DIAGNOSIS — I272 Pulmonary hypertension, unspecified: Secondary | ICD-10-CM | POA: Diagnosis present

## 2022-05-26 DIAGNOSIS — I6522 Occlusion and stenosis of left carotid artery: Secondary | ICD-10-CM | POA: Diagnosis present

## 2022-05-26 DIAGNOSIS — E039 Hypothyroidism, unspecified: Secondary | ICD-10-CM | POA: Diagnosis present

## 2022-05-26 DIAGNOSIS — E785 Hyperlipidemia, unspecified: Secondary | ICD-10-CM | POA: Diagnosis present

## 2022-05-26 DIAGNOSIS — Z885 Allergy status to narcotic agent status: Secondary | ICD-10-CM | POA: Diagnosis not present

## 2022-05-26 DIAGNOSIS — Z955 Presence of coronary angioplasty implant and graft: Secondary | ICD-10-CM | POA: Diagnosis not present

## 2022-05-26 DIAGNOSIS — Z90710 Acquired absence of both cervix and uterus: Secondary | ICD-10-CM | POA: Diagnosis not present

## 2022-05-26 DIAGNOSIS — D649 Anemia, unspecified: Secondary | ICD-10-CM | POA: Diagnosis present

## 2022-05-31 DIAGNOSIS — D649 Anemia, unspecified: Secondary | ICD-10-CM | POA: Diagnosis not present

## 2022-05-31 DIAGNOSIS — Z9181 History of falling: Secondary | ICD-10-CM | POA: Diagnosis not present

## 2022-05-31 DIAGNOSIS — Z85118 Personal history of other malignant neoplasm of bronchus and lung: Secondary | ICD-10-CM | POA: Diagnosis not present

## 2022-05-31 DIAGNOSIS — Z7901 Long term (current) use of anticoagulants: Secondary | ICD-10-CM | POA: Diagnosis not present

## 2022-05-31 DIAGNOSIS — I10 Essential (primary) hypertension: Secondary | ICD-10-CM | POA: Diagnosis not present

## 2022-05-31 DIAGNOSIS — Z955 Presence of coronary angioplasty implant and graft: Secondary | ICD-10-CM | POA: Diagnosis not present

## 2022-05-31 DIAGNOSIS — I2510 Atherosclerotic heart disease of native coronary artery without angina pectoris: Secondary | ICD-10-CM | POA: Diagnosis not present

## 2022-05-31 DIAGNOSIS — J9811 Atelectasis: Secondary | ICD-10-CM | POA: Diagnosis not present

## 2022-05-31 DIAGNOSIS — Z7902 Long term (current) use of antithrombotics/antiplatelets: Secondary | ICD-10-CM | POA: Diagnosis not present

## 2022-05-31 DIAGNOSIS — I480 Paroxysmal atrial fibrillation: Secondary | ICD-10-CM | POA: Diagnosis not present

## 2022-05-31 DIAGNOSIS — I2721 Secondary pulmonary arterial hypertension: Secondary | ICD-10-CM | POA: Diagnosis not present

## 2022-05-31 DIAGNOSIS — E039 Hypothyroidism, unspecified: Secondary | ICD-10-CM | POA: Diagnosis not present

## 2022-05-31 DIAGNOSIS — I6522 Occlusion and stenosis of left carotid artery: Secondary | ICD-10-CM | POA: Diagnosis not present

## 2022-05-31 DIAGNOSIS — I252 Old myocardial infarction: Secondary | ICD-10-CM | POA: Diagnosis not present

## 2022-05-31 DIAGNOSIS — Z7984 Long term (current) use of oral hypoglycemic drugs: Secondary | ICD-10-CM | POA: Diagnosis not present

## 2022-05-31 DIAGNOSIS — K219 Gastro-esophageal reflux disease without esophagitis: Secondary | ICD-10-CM | POA: Diagnosis not present

## 2022-05-31 DIAGNOSIS — I110 Hypertensive heart disease with heart failure: Secondary | ICD-10-CM | POA: Diagnosis not present

## 2022-05-31 DIAGNOSIS — I509 Heart failure, unspecified: Secondary | ICD-10-CM | POA: Diagnosis not present

## 2022-06-01 DIAGNOSIS — J9811 Atelectasis: Secondary | ICD-10-CM | POA: Diagnosis not present

## 2022-06-01 DIAGNOSIS — I480 Paroxysmal atrial fibrillation: Secondary | ICD-10-CM | POA: Diagnosis not present

## 2022-06-01 DIAGNOSIS — I2510 Atherosclerotic heart disease of native coronary artery without angina pectoris: Secondary | ICD-10-CM | POA: Diagnosis not present

## 2022-06-01 DIAGNOSIS — D649 Anemia, unspecified: Secondary | ICD-10-CM | POA: Diagnosis not present

## 2022-06-01 DIAGNOSIS — I509 Heart failure, unspecified: Secondary | ICD-10-CM | POA: Diagnosis not present

## 2022-06-01 DIAGNOSIS — I110 Hypertensive heart disease with heart failure: Secondary | ICD-10-CM | POA: Diagnosis not present

## 2022-06-03 DIAGNOSIS — D649 Anemia, unspecified: Secondary | ICD-10-CM | POA: Diagnosis not present

## 2022-06-03 DIAGNOSIS — I2510 Atherosclerotic heart disease of native coronary artery without angina pectoris: Secondary | ICD-10-CM | POA: Diagnosis not present

## 2022-06-03 DIAGNOSIS — I480 Paroxysmal atrial fibrillation: Secondary | ICD-10-CM | POA: Diagnosis not present

## 2022-06-03 DIAGNOSIS — J9811 Atelectasis: Secondary | ICD-10-CM | POA: Diagnosis not present

## 2022-06-03 DIAGNOSIS — I509 Heart failure, unspecified: Secondary | ICD-10-CM | POA: Diagnosis not present

## 2022-06-03 DIAGNOSIS — I110 Hypertensive heart disease with heart failure: Secondary | ICD-10-CM | POA: Diagnosis not present

## 2022-06-04 DIAGNOSIS — I480 Paroxysmal atrial fibrillation: Secondary | ICD-10-CM | POA: Diagnosis not present

## 2022-06-04 DIAGNOSIS — D649 Anemia, unspecified: Secondary | ICD-10-CM | POA: Diagnosis not present

## 2022-06-04 DIAGNOSIS — I2510 Atherosclerotic heart disease of native coronary artery without angina pectoris: Secondary | ICD-10-CM | POA: Diagnosis not present

## 2022-06-04 DIAGNOSIS — I110 Hypertensive heart disease with heart failure: Secondary | ICD-10-CM | POA: Diagnosis not present

## 2022-06-04 DIAGNOSIS — J9811 Atelectasis: Secondary | ICD-10-CM | POA: Diagnosis not present

## 2022-06-04 DIAGNOSIS — I509 Heart failure, unspecified: Secondary | ICD-10-CM | POA: Diagnosis not present

## 2022-06-08 DIAGNOSIS — I480 Paroxysmal atrial fibrillation: Secondary | ICD-10-CM | POA: Diagnosis not present

## 2022-06-08 DIAGNOSIS — I110 Hypertensive heart disease with heart failure: Secondary | ICD-10-CM | POA: Diagnosis not present

## 2022-06-08 DIAGNOSIS — I2510 Atherosclerotic heart disease of native coronary artery without angina pectoris: Secondary | ICD-10-CM | POA: Diagnosis not present

## 2022-06-08 DIAGNOSIS — J9811 Atelectasis: Secondary | ICD-10-CM | POA: Diagnosis not present

## 2022-06-08 DIAGNOSIS — I509 Heart failure, unspecified: Secondary | ICD-10-CM | POA: Diagnosis not present

## 2022-06-08 DIAGNOSIS — D649 Anemia, unspecified: Secondary | ICD-10-CM | POA: Diagnosis not present

## 2022-06-09 DIAGNOSIS — D649 Anemia, unspecified: Secondary | ICD-10-CM | POA: Diagnosis not present

## 2022-06-09 DIAGNOSIS — J9811 Atelectasis: Secondary | ICD-10-CM | POA: Diagnosis not present

## 2022-06-09 DIAGNOSIS — I110 Hypertensive heart disease with heart failure: Secondary | ICD-10-CM | POA: Diagnosis not present

## 2022-06-09 DIAGNOSIS — I509 Heart failure, unspecified: Secondary | ICD-10-CM | POA: Diagnosis not present

## 2022-06-09 DIAGNOSIS — I480 Paroxysmal atrial fibrillation: Secondary | ICD-10-CM | POA: Diagnosis not present

## 2022-06-09 DIAGNOSIS — I2510 Atherosclerotic heart disease of native coronary artery without angina pectoris: Secondary | ICD-10-CM | POA: Diagnosis not present

## 2022-06-10 DIAGNOSIS — Z955 Presence of coronary angioplasty implant and graft: Secondary | ICD-10-CM | POA: Diagnosis not present

## 2022-06-10 DIAGNOSIS — I2510 Atherosclerotic heart disease of native coronary artery without angina pectoris: Secondary | ICD-10-CM | POA: Diagnosis not present

## 2022-06-10 DIAGNOSIS — I5030 Unspecified diastolic (congestive) heart failure: Secondary | ICD-10-CM | POA: Diagnosis not present

## 2022-06-10 DIAGNOSIS — I2721 Secondary pulmonary arterial hypertension: Secondary | ICD-10-CM | POA: Diagnosis not present

## 2022-06-10 DIAGNOSIS — I509 Heart failure, unspecified: Secondary | ICD-10-CM | POA: Diagnosis not present

## 2022-06-10 DIAGNOSIS — I252 Old myocardial infarction: Secondary | ICD-10-CM | POA: Diagnosis not present

## 2022-06-10 DIAGNOSIS — I6522 Occlusion and stenosis of left carotid artery: Secondary | ICD-10-CM | POA: Diagnosis not present

## 2022-06-10 DIAGNOSIS — Z7901 Long term (current) use of anticoagulants: Secondary | ICD-10-CM | POA: Diagnosis not present

## 2022-06-10 DIAGNOSIS — J9811 Atelectasis: Secondary | ICD-10-CM | POA: Diagnosis not present

## 2022-06-10 DIAGNOSIS — I480 Paroxysmal atrial fibrillation: Secondary | ICD-10-CM | POA: Diagnosis not present

## 2022-06-10 DIAGNOSIS — E039 Hypothyroidism, unspecified: Secondary | ICD-10-CM | POA: Diagnosis not present

## 2022-06-10 DIAGNOSIS — I10 Essential (primary) hypertension: Secondary | ICD-10-CM | POA: Diagnosis not present

## 2022-06-10 DIAGNOSIS — K219 Gastro-esophageal reflux disease without esophagitis: Secondary | ICD-10-CM | POA: Diagnosis not present

## 2022-06-10 DIAGNOSIS — D649 Anemia, unspecified: Secondary | ICD-10-CM | POA: Diagnosis not present

## 2022-06-10 DIAGNOSIS — E785 Hyperlipidemia, unspecified: Secondary | ICD-10-CM | POA: Diagnosis not present

## 2022-06-10 DIAGNOSIS — I110 Hypertensive heart disease with heart failure: Secondary | ICD-10-CM | POA: Diagnosis not present

## 2022-06-11 DIAGNOSIS — I509 Heart failure, unspecified: Secondary | ICD-10-CM | POA: Diagnosis not present

## 2022-06-11 DIAGNOSIS — R899 Unspecified abnormal finding in specimens from other organs, systems and tissues: Secondary | ICD-10-CM | POA: Diagnosis not present

## 2022-06-11 DIAGNOSIS — Z6824 Body mass index (BMI) 24.0-24.9, adult: Secondary | ICD-10-CM | POA: Diagnosis not present

## 2022-06-11 DIAGNOSIS — Z09 Encounter for follow-up examination after completed treatment for conditions other than malignant neoplasm: Secondary | ICD-10-CM | POA: Diagnosis not present

## 2022-06-11 DIAGNOSIS — E876 Hypokalemia: Secondary | ICD-10-CM | POA: Diagnosis not present

## 2022-06-14 DIAGNOSIS — Z885 Allergy status to narcotic agent status: Secondary | ICD-10-CM | POA: Diagnosis not present

## 2022-06-14 DIAGNOSIS — D649 Anemia, unspecified: Secondary | ICD-10-CM | POA: Diagnosis not present

## 2022-06-14 DIAGNOSIS — K5730 Diverticulosis of large intestine without perforation or abscess without bleeding: Secondary | ICD-10-CM | POA: Diagnosis not present

## 2022-06-14 DIAGNOSIS — Z880 Allergy status to penicillin: Secondary | ICD-10-CM | POA: Diagnosis not present

## 2022-06-14 DIAGNOSIS — I10 Essential (primary) hypertension: Secondary | ICD-10-CM | POA: Diagnosis not present

## 2022-06-14 DIAGNOSIS — I959 Hypotension, unspecified: Secondary | ICD-10-CM | POA: Diagnosis not present

## 2022-06-14 DIAGNOSIS — Z882 Allergy status to sulfonamides status: Secondary | ICD-10-CM | POA: Diagnosis not present

## 2022-06-14 DIAGNOSIS — Z85118 Personal history of other malignant neoplasm of bronchus and lung: Secondary | ICD-10-CM | POA: Diagnosis not present

## 2022-06-14 DIAGNOSIS — R001 Bradycardia, unspecified: Secondary | ICD-10-CM | POA: Diagnosis not present

## 2022-06-14 DIAGNOSIS — S20212A Contusion of left front wall of thorax, initial encounter: Secondary | ICD-10-CM | POA: Diagnosis not present

## 2022-06-14 DIAGNOSIS — S7002XA Contusion of left hip, initial encounter: Secondary | ICD-10-CM | POA: Diagnosis not present

## 2022-06-14 DIAGNOSIS — R0781 Pleurodynia: Secondary | ICD-10-CM | POA: Diagnosis not present

## 2022-06-14 DIAGNOSIS — T68XXXA Hypothermia, initial encounter: Secondary | ICD-10-CM | POA: Diagnosis not present

## 2022-06-14 DIAGNOSIS — R296 Repeated falls: Secondary | ICD-10-CM | POA: Diagnosis not present

## 2022-06-14 DIAGNOSIS — S0990XA Unspecified injury of head, initial encounter: Secondary | ICD-10-CM | POA: Diagnosis not present

## 2022-06-14 DIAGNOSIS — Z888 Allergy status to other drugs, medicaments and biological substances status: Secondary | ICD-10-CM | POA: Diagnosis not present

## 2022-06-14 DIAGNOSIS — I4891 Unspecified atrial fibrillation: Secondary | ICD-10-CM | POA: Diagnosis not present

## 2022-06-15 DIAGNOSIS — D649 Anemia, unspecified: Secondary | ICD-10-CM | POA: Diagnosis not present

## 2022-06-15 DIAGNOSIS — J9811 Atelectasis: Secondary | ICD-10-CM | POA: Diagnosis not present

## 2022-06-15 DIAGNOSIS — I110 Hypertensive heart disease with heart failure: Secondary | ICD-10-CM | POA: Diagnosis not present

## 2022-06-15 DIAGNOSIS — I509 Heart failure, unspecified: Secondary | ICD-10-CM | POA: Diagnosis not present

## 2022-06-15 DIAGNOSIS — I2510 Atherosclerotic heart disease of native coronary artery without angina pectoris: Secondary | ICD-10-CM | POA: Diagnosis not present

## 2022-06-15 DIAGNOSIS — I480 Paroxysmal atrial fibrillation: Secondary | ICD-10-CM | POA: Diagnosis not present

## 2022-06-18 DIAGNOSIS — J9811 Atelectasis: Secondary | ICD-10-CM | POA: Diagnosis not present

## 2022-06-18 DIAGNOSIS — I2510 Atherosclerotic heart disease of native coronary artery without angina pectoris: Secondary | ICD-10-CM | POA: Diagnosis not present

## 2022-06-18 DIAGNOSIS — I480 Paroxysmal atrial fibrillation: Secondary | ICD-10-CM | POA: Diagnosis not present

## 2022-06-18 DIAGNOSIS — I509 Heart failure, unspecified: Secondary | ICD-10-CM | POA: Diagnosis not present

## 2022-06-18 DIAGNOSIS — D649 Anemia, unspecified: Secondary | ICD-10-CM | POA: Diagnosis not present

## 2022-06-18 DIAGNOSIS — I110 Hypertensive heart disease with heart failure: Secondary | ICD-10-CM | POA: Diagnosis not present

## 2022-06-21 DIAGNOSIS — D649 Anemia, unspecified: Secondary | ICD-10-CM | POA: Diagnosis not present

## 2022-06-21 DIAGNOSIS — I480 Paroxysmal atrial fibrillation: Secondary | ICD-10-CM | POA: Diagnosis not present

## 2022-06-21 DIAGNOSIS — I110 Hypertensive heart disease with heart failure: Secondary | ICD-10-CM | POA: Diagnosis not present

## 2022-06-21 DIAGNOSIS — I509 Heart failure, unspecified: Secondary | ICD-10-CM | POA: Diagnosis not present

## 2022-06-21 DIAGNOSIS — J9811 Atelectasis: Secondary | ICD-10-CM | POA: Diagnosis not present

## 2022-06-21 DIAGNOSIS — I2510 Atherosclerotic heart disease of native coronary artery without angina pectoris: Secondary | ICD-10-CM | POA: Diagnosis not present

## 2022-06-23 DIAGNOSIS — I2510 Atherosclerotic heart disease of native coronary artery without angina pectoris: Secondary | ICD-10-CM | POA: Diagnosis not present

## 2022-06-23 DIAGNOSIS — I509 Heart failure, unspecified: Secondary | ICD-10-CM | POA: Diagnosis not present

## 2022-06-23 DIAGNOSIS — I480 Paroxysmal atrial fibrillation: Secondary | ICD-10-CM | POA: Diagnosis not present

## 2022-06-23 DIAGNOSIS — J9811 Atelectasis: Secondary | ICD-10-CM | POA: Diagnosis not present

## 2022-06-23 DIAGNOSIS — I110 Hypertensive heart disease with heart failure: Secondary | ICD-10-CM | POA: Diagnosis not present

## 2022-06-23 DIAGNOSIS — D649 Anemia, unspecified: Secondary | ICD-10-CM | POA: Diagnosis not present

## 2022-06-24 DIAGNOSIS — J9811 Atelectasis: Secondary | ICD-10-CM | POA: Diagnosis not present

## 2022-06-24 DIAGNOSIS — I509 Heart failure, unspecified: Secondary | ICD-10-CM | POA: Diagnosis not present

## 2022-06-24 DIAGNOSIS — I110 Hypertensive heart disease with heart failure: Secondary | ICD-10-CM | POA: Diagnosis not present

## 2022-06-24 DIAGNOSIS — I2510 Atherosclerotic heart disease of native coronary artery without angina pectoris: Secondary | ICD-10-CM | POA: Diagnosis not present

## 2022-06-24 DIAGNOSIS — D649 Anemia, unspecified: Secondary | ICD-10-CM | POA: Diagnosis not present

## 2022-06-24 DIAGNOSIS — I480 Paroxysmal atrial fibrillation: Secondary | ICD-10-CM | POA: Diagnosis not present

## 2022-06-28 DIAGNOSIS — D649 Anemia, unspecified: Secondary | ICD-10-CM | POA: Diagnosis not present

## 2022-06-28 DIAGNOSIS — I2510 Atherosclerotic heart disease of native coronary artery without angina pectoris: Secondary | ICD-10-CM | POA: Diagnosis not present

## 2022-06-28 DIAGNOSIS — I480 Paroxysmal atrial fibrillation: Secondary | ICD-10-CM | POA: Diagnosis not present

## 2022-06-28 DIAGNOSIS — I509 Heart failure, unspecified: Secondary | ICD-10-CM | POA: Diagnosis not present

## 2022-06-28 DIAGNOSIS — I110 Hypertensive heart disease with heart failure: Secondary | ICD-10-CM | POA: Diagnosis not present

## 2022-06-28 DIAGNOSIS — J9811 Atelectasis: Secondary | ICD-10-CM | POA: Diagnosis not present

## 2022-06-30 DIAGNOSIS — I480 Paroxysmal atrial fibrillation: Secondary | ICD-10-CM | POA: Diagnosis not present

## 2022-06-30 DIAGNOSIS — Z9181 History of falling: Secondary | ICD-10-CM | POA: Diagnosis not present

## 2022-06-30 DIAGNOSIS — I252 Old myocardial infarction: Secondary | ICD-10-CM | POA: Diagnosis not present

## 2022-06-30 DIAGNOSIS — J9811 Atelectasis: Secondary | ICD-10-CM | POA: Diagnosis not present

## 2022-06-30 DIAGNOSIS — D649 Anemia, unspecified: Secondary | ICD-10-CM | POA: Diagnosis not present

## 2022-06-30 DIAGNOSIS — I2510 Atherosclerotic heart disease of native coronary artery without angina pectoris: Secondary | ICD-10-CM | POA: Diagnosis not present

## 2022-06-30 DIAGNOSIS — Z955 Presence of coronary angioplasty implant and graft: Secondary | ICD-10-CM | POA: Diagnosis not present

## 2022-06-30 DIAGNOSIS — Z7984 Long term (current) use of oral hypoglycemic drugs: Secondary | ICD-10-CM | POA: Diagnosis not present

## 2022-06-30 DIAGNOSIS — E039 Hypothyroidism, unspecified: Secondary | ICD-10-CM | POA: Diagnosis not present

## 2022-06-30 DIAGNOSIS — Z7901 Long term (current) use of anticoagulants: Secondary | ICD-10-CM | POA: Diagnosis not present

## 2022-06-30 DIAGNOSIS — I10 Essential (primary) hypertension: Secondary | ICD-10-CM | POA: Diagnosis not present

## 2022-06-30 DIAGNOSIS — I509 Heart failure, unspecified: Secondary | ICD-10-CM | POA: Diagnosis not present

## 2022-06-30 DIAGNOSIS — Z85118 Personal history of other malignant neoplasm of bronchus and lung: Secondary | ICD-10-CM | POA: Diagnosis not present

## 2022-06-30 DIAGNOSIS — I6522 Occlusion and stenosis of left carotid artery: Secondary | ICD-10-CM | POA: Diagnosis not present

## 2022-06-30 DIAGNOSIS — Z7902 Long term (current) use of antithrombotics/antiplatelets: Secondary | ICD-10-CM | POA: Diagnosis not present

## 2022-06-30 DIAGNOSIS — I110 Hypertensive heart disease with heart failure: Secondary | ICD-10-CM | POA: Diagnosis not present

## 2022-06-30 DIAGNOSIS — K219 Gastro-esophageal reflux disease without esophagitis: Secondary | ICD-10-CM | POA: Diagnosis not present

## 2022-06-30 DIAGNOSIS — I2721 Secondary pulmonary arterial hypertension: Secondary | ICD-10-CM | POA: Diagnosis not present

## 2022-07-06 DIAGNOSIS — I2510 Atherosclerotic heart disease of native coronary artery without angina pectoris: Secondary | ICD-10-CM | POA: Diagnosis not present

## 2022-07-06 DIAGNOSIS — I509 Heart failure, unspecified: Secondary | ICD-10-CM | POA: Diagnosis not present

## 2022-07-06 DIAGNOSIS — D649 Anemia, unspecified: Secondary | ICD-10-CM | POA: Diagnosis not present

## 2022-07-06 DIAGNOSIS — J9811 Atelectasis: Secondary | ICD-10-CM | POA: Diagnosis not present

## 2022-07-06 DIAGNOSIS — I480 Paroxysmal atrial fibrillation: Secondary | ICD-10-CM | POA: Diagnosis not present

## 2022-07-06 DIAGNOSIS — I110 Hypertensive heart disease with heart failure: Secondary | ICD-10-CM | POA: Diagnosis not present

## 2022-07-08 DIAGNOSIS — I10 Essential (primary) hypertension: Secondary | ICD-10-CM | POA: Diagnosis not present

## 2022-07-08 DIAGNOSIS — Z955 Presence of coronary angioplasty implant and graft: Secondary | ICD-10-CM | POA: Diagnosis not present

## 2022-07-08 DIAGNOSIS — I5032 Chronic diastolic (congestive) heart failure: Secondary | ICD-10-CM | POA: Diagnosis not present

## 2022-07-08 DIAGNOSIS — I480 Paroxysmal atrial fibrillation: Secondary | ICD-10-CM | POA: Diagnosis not present

## 2022-07-08 DIAGNOSIS — E7800 Pure hypercholesterolemia, unspecified: Secondary | ICD-10-CM | POA: Diagnosis not present

## 2022-07-08 DIAGNOSIS — I2510 Atherosclerotic heart disease of native coronary artery without angina pectoris: Secondary | ICD-10-CM | POA: Diagnosis not present

## 2022-07-08 DIAGNOSIS — Z7901 Long term (current) use of anticoagulants: Secondary | ICD-10-CM | POA: Diagnosis not present

## 2022-07-09 DIAGNOSIS — I110 Hypertensive heart disease with heart failure: Secondary | ICD-10-CM | POA: Diagnosis not present

## 2022-07-09 DIAGNOSIS — I2510 Atherosclerotic heart disease of native coronary artery without angina pectoris: Secondary | ICD-10-CM | POA: Diagnosis not present

## 2022-07-09 DIAGNOSIS — I480 Paroxysmal atrial fibrillation: Secondary | ICD-10-CM | POA: Diagnosis not present

## 2022-07-09 DIAGNOSIS — D649 Anemia, unspecified: Secondary | ICD-10-CM | POA: Diagnosis not present

## 2022-07-09 DIAGNOSIS — J9811 Atelectasis: Secondary | ICD-10-CM | POA: Diagnosis not present

## 2022-07-09 DIAGNOSIS — I509 Heart failure, unspecified: Secondary | ICD-10-CM | POA: Diagnosis not present

## 2022-07-12 DIAGNOSIS — I110 Hypertensive heart disease with heart failure: Secondary | ICD-10-CM | POA: Diagnosis not present

## 2022-07-12 DIAGNOSIS — I509 Heart failure, unspecified: Secondary | ICD-10-CM | POA: Diagnosis not present

## 2022-07-12 DIAGNOSIS — I2510 Atherosclerotic heart disease of native coronary artery without angina pectoris: Secondary | ICD-10-CM | POA: Diagnosis not present

## 2022-07-12 DIAGNOSIS — J9811 Atelectasis: Secondary | ICD-10-CM | POA: Diagnosis not present

## 2022-07-12 DIAGNOSIS — I480 Paroxysmal atrial fibrillation: Secondary | ICD-10-CM | POA: Diagnosis not present

## 2022-07-12 DIAGNOSIS — D649 Anemia, unspecified: Secondary | ICD-10-CM | POA: Diagnosis not present

## 2022-07-15 DIAGNOSIS — I110 Hypertensive heart disease with heart failure: Secondary | ICD-10-CM | POA: Diagnosis not present

## 2022-07-15 DIAGNOSIS — I2510 Atherosclerotic heart disease of native coronary artery without angina pectoris: Secondary | ICD-10-CM | POA: Diagnosis not present

## 2022-07-15 DIAGNOSIS — J9811 Atelectasis: Secondary | ICD-10-CM | POA: Diagnosis not present

## 2022-07-15 DIAGNOSIS — D649 Anemia, unspecified: Secondary | ICD-10-CM | POA: Diagnosis not present

## 2022-07-15 DIAGNOSIS — I509 Heart failure, unspecified: Secondary | ICD-10-CM | POA: Diagnosis not present

## 2022-07-15 DIAGNOSIS — I480 Paroxysmal atrial fibrillation: Secondary | ICD-10-CM | POA: Diagnosis not present

## 2022-07-20 DIAGNOSIS — I509 Heart failure, unspecified: Secondary | ICD-10-CM | POA: Diagnosis not present

## 2022-07-20 DIAGNOSIS — J9811 Atelectasis: Secondary | ICD-10-CM | POA: Diagnosis not present

## 2022-07-20 DIAGNOSIS — D649 Anemia, unspecified: Secondary | ICD-10-CM | POA: Diagnosis not present

## 2022-07-20 DIAGNOSIS — I2510 Atherosclerotic heart disease of native coronary artery without angina pectoris: Secondary | ICD-10-CM | POA: Diagnosis not present

## 2022-07-20 DIAGNOSIS — I110 Hypertensive heart disease with heart failure: Secondary | ICD-10-CM | POA: Diagnosis not present

## 2022-07-20 DIAGNOSIS — I480 Paroxysmal atrial fibrillation: Secondary | ICD-10-CM | POA: Diagnosis not present

## 2022-07-22 ENCOUNTER — Other Ambulatory Visit: Payer: Self-pay | Admitting: Cardiology

## 2022-07-22 DIAGNOSIS — I110 Hypertensive heart disease with heart failure: Secondary | ICD-10-CM | POA: Diagnosis not present

## 2022-07-22 DIAGNOSIS — J9811 Atelectasis: Secondary | ICD-10-CM | POA: Diagnosis not present

## 2022-07-22 DIAGNOSIS — I509 Heart failure, unspecified: Secondary | ICD-10-CM | POA: Diagnosis not present

## 2022-07-22 DIAGNOSIS — D649 Anemia, unspecified: Secondary | ICD-10-CM | POA: Diagnosis not present

## 2022-07-22 DIAGNOSIS — I480 Paroxysmal atrial fibrillation: Secondary | ICD-10-CM | POA: Diagnosis not present

## 2022-07-22 DIAGNOSIS — I2510 Atherosclerotic heart disease of native coronary artery without angina pectoris: Secondary | ICD-10-CM | POA: Diagnosis not present

## 2022-07-22 NOTE — Telephone Encounter (Signed)
Prescription refill request for Eliquis received. Indication: afib  Last office visit: [place], 08/18/2021 Scr: 0.81, 06/14/2022 Age: 76 yo  Weight: 62.1 kg  Refill sent.

## 2022-10-26 ENCOUNTER — Other Ambulatory Visit: Payer: Self-pay

## 2022-10-26 DIAGNOSIS — R0602 Shortness of breath: Secondary | ICD-10-CM | POA: Diagnosis not present

## 2022-10-26 DIAGNOSIS — Z85118 Personal history of other malignant neoplasm of bronchus and lung: Secondary | ICD-10-CM | POA: Diagnosis not present

## 2022-10-26 DIAGNOSIS — R55 Syncope and collapse: Secondary | ICD-10-CM | POA: Diagnosis not present

## 2022-10-26 DIAGNOSIS — R9431 Abnormal electrocardiogram [ECG] [EKG]: Secondary | ICD-10-CM | POA: Diagnosis not present

## 2022-10-26 DIAGNOSIS — I4891 Unspecified atrial fibrillation: Secondary | ICD-10-CM | POA: Diagnosis not present

## 2022-10-26 DIAGNOSIS — Z79899 Other long term (current) drug therapy: Secondary | ICD-10-CM | POA: Diagnosis not present

## 2022-10-26 DIAGNOSIS — M810 Age-related osteoporosis without current pathological fracture: Secondary | ICD-10-CM | POA: Diagnosis not present

## 2022-10-26 DIAGNOSIS — E876 Hypokalemia: Secondary | ICD-10-CM | POA: Diagnosis not present

## 2022-10-26 DIAGNOSIS — E079 Disorder of thyroid, unspecified: Secondary | ICD-10-CM | POA: Diagnosis not present

## 2022-10-26 DIAGNOSIS — I10 Essential (primary) hypertension: Secondary | ICD-10-CM | POA: Diagnosis not present

## 2022-10-26 DIAGNOSIS — Z7901 Long term (current) use of anticoagulants: Secondary | ICD-10-CM | POA: Diagnosis not present

## 2022-10-26 DIAGNOSIS — R531 Weakness: Secondary | ICD-10-CM | POA: Diagnosis not present

## 2022-10-26 DIAGNOSIS — Z880 Allergy status to penicillin: Secondary | ICD-10-CM | POA: Diagnosis not present

## 2022-10-26 DIAGNOSIS — Z7902 Long term (current) use of antithrombotics/antiplatelets: Secondary | ICD-10-CM | POA: Diagnosis not present

## 2022-10-26 DIAGNOSIS — R079 Chest pain, unspecified: Secondary | ICD-10-CM | POA: Diagnosis not present

## 2022-10-26 DIAGNOSIS — Z79890 Hormone replacement therapy (postmenopausal): Secondary | ICD-10-CM | POA: Diagnosis not present

## 2022-10-26 DIAGNOSIS — I5032 Chronic diastolic (congestive) heart failure: Secondary | ICD-10-CM | POA: Diagnosis not present

## 2022-10-26 DIAGNOSIS — Z882 Allergy status to sulfonamides status: Secondary | ICD-10-CM | POA: Diagnosis not present

## 2022-10-26 MED ORDER — ROSUVASTATIN CALCIUM 20 MG PO TABS: 20.0000 mg | ORAL_TABLET | Freq: Every day | ORAL | 0 refills | Status: DC

## 2022-10-28 DIAGNOSIS — I480 Paroxysmal atrial fibrillation: Secondary | ICD-10-CM | POA: Diagnosis not present

## 2022-10-28 DIAGNOSIS — I080 Rheumatic disorders of both mitral and aortic valves: Secondary | ICD-10-CM | POA: Diagnosis not present

## 2022-10-28 DIAGNOSIS — Z1330 Encounter for screening examination for mental health and behavioral disorders, unspecified: Secondary | ICD-10-CM | POA: Diagnosis not present

## 2022-10-28 DIAGNOSIS — Z7901 Long term (current) use of anticoagulants: Secondary | ICD-10-CM | POA: Diagnosis not present

## 2022-10-28 DIAGNOSIS — Z955 Presence of coronary angioplasty implant and graft: Secondary | ICD-10-CM | POA: Diagnosis not present

## 2022-10-28 DIAGNOSIS — E7800 Pure hypercholesterolemia, unspecified: Secondary | ICD-10-CM | POA: Diagnosis not present

## 2022-10-28 DIAGNOSIS — I517 Cardiomegaly: Secondary | ICD-10-CM | POA: Diagnosis not present

## 2022-10-28 DIAGNOSIS — I2510 Atherosclerotic heart disease of native coronary artery without angina pectoris: Secondary | ICD-10-CM | POA: Diagnosis not present

## 2022-10-28 DIAGNOSIS — I10 Essential (primary) hypertension: Secondary | ICD-10-CM | POA: Diagnosis not present

## 2022-11-01 DIAGNOSIS — I2510 Atherosclerotic heart disease of native coronary artery without angina pectoris: Secondary | ICD-10-CM | POA: Diagnosis not present

## 2022-11-01 DIAGNOSIS — E876 Hypokalemia: Secondary | ICD-10-CM | POA: Diagnosis not present

## 2022-11-01 DIAGNOSIS — I5032 Chronic diastolic (congestive) heart failure: Secondary | ICD-10-CM | POA: Diagnosis not present

## 2022-11-01 DIAGNOSIS — I480 Paroxysmal atrial fibrillation: Secondary | ICD-10-CM | POA: Diagnosis not present

## 2022-11-01 DIAGNOSIS — Z955 Presence of coronary angioplasty implant and graft: Secondary | ICD-10-CM | POA: Diagnosis not present

## 2022-11-19 ENCOUNTER — Other Ambulatory Visit: Payer: Self-pay | Admitting: Family Medicine

## 2022-11-19 DIAGNOSIS — Z1231 Encounter for screening mammogram for malignant neoplasm of breast: Secondary | ICD-10-CM

## 2022-11-19 DIAGNOSIS — E2839 Other primary ovarian failure: Secondary | ICD-10-CM

## 2022-12-04 ENCOUNTER — Other Ambulatory Visit: Payer: Self-pay | Admitting: Internal Medicine

## 2022-12-06 ENCOUNTER — Other Ambulatory Visit: Payer: Self-pay | Admitting: *Deleted

## 2022-12-06 MED ORDER — ROSUVASTATIN CALCIUM 20 MG PO TABS: 20.0000 mg | ORAL_TABLET | Freq: Every day | ORAL | 0 refills | Status: AC

## 2022-12-08 DIAGNOSIS — E876 Hypokalemia: Secondary | ICD-10-CM | POA: Diagnosis not present

## 2022-12-08 DIAGNOSIS — I5032 Chronic diastolic (congestive) heart failure: Secondary | ICD-10-CM | POA: Diagnosis not present

## 2022-12-08 DIAGNOSIS — R790 Abnormal level of blood mineral: Secondary | ICD-10-CM | POA: Diagnosis not present

## 2022-12-21 DIAGNOSIS — I5030 Unspecified diastolic (congestive) heart failure: Secondary | ICD-10-CM | POA: Diagnosis not present

## 2023-01-26 DIAGNOSIS — Z955 Presence of coronary angioplasty implant and graft: Secondary | ICD-10-CM | POA: Diagnosis not present

## 2023-01-26 DIAGNOSIS — I480 Paroxysmal atrial fibrillation: Secondary | ICD-10-CM | POA: Diagnosis not present

## 2023-01-26 DIAGNOSIS — E878 Other disorders of electrolyte and fluid balance, not elsewhere classified: Secondary | ICD-10-CM | POA: Diagnosis not present

## 2023-01-26 DIAGNOSIS — I10 Essential (primary) hypertension: Secondary | ICD-10-CM | POA: Diagnosis not present

## 2023-01-26 DIAGNOSIS — I2510 Atherosclerotic heart disease of native coronary artery without angina pectoris: Secondary | ICD-10-CM | POA: Diagnosis not present

## 2023-01-26 DIAGNOSIS — Z7901 Long term (current) use of anticoagulants: Secondary | ICD-10-CM | POA: Diagnosis not present

## 2023-01-31 DIAGNOSIS — K449 Diaphragmatic hernia without obstruction or gangrene: Secondary | ICD-10-CM | POA: Diagnosis present

## 2023-01-31 DIAGNOSIS — K219 Gastro-esophageal reflux disease without esophagitis: Secondary | ICD-10-CM | POA: Diagnosis present

## 2023-01-31 DIAGNOSIS — Z888 Allergy status to other drugs, medicaments and biological substances status: Secondary | ICD-10-CM | POA: Diagnosis not present

## 2023-01-31 DIAGNOSIS — E860 Dehydration: Secondary | ICD-10-CM | POA: Diagnosis present

## 2023-01-31 DIAGNOSIS — I5032 Chronic diastolic (congestive) heart failure: Secondary | ICD-10-CM | POA: Diagnosis present

## 2023-01-31 DIAGNOSIS — Z7901 Long term (current) use of anticoagulants: Secondary | ICD-10-CM | POA: Diagnosis not present

## 2023-01-31 DIAGNOSIS — Z881 Allergy status to other antibiotic agents status: Secondary | ICD-10-CM | POA: Diagnosis not present

## 2023-01-31 DIAGNOSIS — Z8711 Personal history of peptic ulcer disease: Secondary | ICD-10-CM | POA: Diagnosis not present

## 2023-01-31 DIAGNOSIS — Z882 Allergy status to sulfonamides status: Secondary | ICD-10-CM | POA: Diagnosis not present

## 2023-01-31 DIAGNOSIS — Z955 Presence of coronary angioplasty implant and graft: Secondary | ICD-10-CM | POA: Diagnosis not present

## 2023-01-31 DIAGNOSIS — Z8679 Personal history of other diseases of the circulatory system: Secondary | ICD-10-CM | POA: Diagnosis not present

## 2023-01-31 DIAGNOSIS — I480 Paroxysmal atrial fibrillation: Secondary | ICD-10-CM | POA: Diagnosis present

## 2023-01-31 DIAGNOSIS — I509 Heart failure, unspecified: Secondary | ICD-10-CM | POA: Diagnosis not present

## 2023-01-31 DIAGNOSIS — Z79899 Other long term (current) drug therapy: Secondary | ICD-10-CM | POA: Diagnosis not present

## 2023-01-31 DIAGNOSIS — M810 Age-related osteoporosis without current pathological fracture: Secondary | ICD-10-CM | POA: Diagnosis present

## 2023-01-31 DIAGNOSIS — Z885 Allergy status to narcotic agent status: Secondary | ICD-10-CM | POA: Diagnosis not present

## 2023-01-31 DIAGNOSIS — D649 Anemia, unspecified: Secondary | ICD-10-CM | POA: Diagnosis not present

## 2023-01-31 DIAGNOSIS — I2510 Atherosclerotic heart disease of native coronary artery without angina pectoris: Secondary | ICD-10-CM | POA: Diagnosis present

## 2023-01-31 DIAGNOSIS — I4891 Unspecified atrial fibrillation: Secondary | ICD-10-CM | POA: Diagnosis not present

## 2023-01-31 DIAGNOSIS — E876 Hypokalemia: Secondary | ICD-10-CM | POA: Diagnosis not present

## 2023-01-31 DIAGNOSIS — Z7902 Long term (current) use of antithrombotics/antiplatelets: Secondary | ICD-10-CM | POA: Diagnosis not present

## 2023-01-31 DIAGNOSIS — I110 Hypertensive heart disease with heart failure: Secondary | ICD-10-CM | POA: Diagnosis present

## 2023-01-31 DIAGNOSIS — E039 Hypothyroidism, unspecified: Secondary | ICD-10-CM | POA: Diagnosis present

## 2023-01-31 DIAGNOSIS — Z66 Do not resuscitate: Secondary | ICD-10-CM | POA: Diagnosis present

## 2023-01-31 DIAGNOSIS — D509 Iron deficiency anemia, unspecified: Secondary | ICD-10-CM | POA: Diagnosis present

## 2023-01-31 DIAGNOSIS — Z880 Allergy status to penicillin: Secondary | ICD-10-CM | POA: Diagnosis not present

## 2023-01-31 DIAGNOSIS — R002 Palpitations: Secondary | ICD-10-CM | POA: Diagnosis not present

## 2023-01-31 DIAGNOSIS — R197 Diarrhea, unspecified: Secondary | ICD-10-CM | POA: Diagnosis present

## 2023-01-31 DIAGNOSIS — Z85118 Personal history of other malignant neoplasm of bronchus and lung: Secondary | ICD-10-CM | POA: Diagnosis not present

## 2023-01-31 DIAGNOSIS — I340 Nonrheumatic mitral (valve) insufficiency: Secondary | ICD-10-CM | POA: Diagnosis not present

## 2023-01-31 DIAGNOSIS — I272 Pulmonary hypertension, unspecified: Secondary | ICD-10-CM | POA: Diagnosis not present

## 2023-01-31 DIAGNOSIS — D500 Iron deficiency anemia secondary to blood loss (chronic): Secondary | ICD-10-CM | POA: Diagnosis not present

## 2023-01-31 DIAGNOSIS — R079 Chest pain, unspecified: Secondary | ICD-10-CM | POA: Diagnosis not present

## 2023-01-31 DIAGNOSIS — R531 Weakness: Secondary | ICD-10-CM | POA: Diagnosis not present

## 2023-02-01 DIAGNOSIS — Z888 Allergy status to other drugs, medicaments and biological substances status: Secondary | ICD-10-CM | POA: Diagnosis not present

## 2023-02-01 DIAGNOSIS — Z7902 Long term (current) use of antithrombotics/antiplatelets: Secondary | ICD-10-CM | POA: Diagnosis not present

## 2023-02-01 DIAGNOSIS — Z881 Allergy status to other antibiotic agents status: Secondary | ICD-10-CM | POA: Diagnosis not present

## 2023-02-01 DIAGNOSIS — R002 Palpitations: Secondary | ICD-10-CM | POA: Diagnosis not present

## 2023-02-01 DIAGNOSIS — Z7901 Long term (current) use of anticoagulants: Secondary | ICD-10-CM | POA: Diagnosis not present

## 2023-02-01 DIAGNOSIS — I4891 Unspecified atrial fibrillation: Secondary | ICD-10-CM | POA: Diagnosis not present

## 2023-02-01 DIAGNOSIS — E876 Hypokalemia: Secondary | ICD-10-CM | POA: Diagnosis not present

## 2023-02-01 DIAGNOSIS — R197 Diarrhea, unspecified: Secondary | ICD-10-CM | POA: Diagnosis not present

## 2023-02-01 DIAGNOSIS — Z85118 Personal history of other malignant neoplasm of bronchus and lung: Secondary | ICD-10-CM | POA: Diagnosis not present

## 2023-02-01 DIAGNOSIS — I5032 Chronic diastolic (congestive) heart failure: Secondary | ICD-10-CM | POA: Diagnosis present

## 2023-02-01 DIAGNOSIS — I2510 Atherosclerotic heart disease of native coronary artery without angina pectoris: Secondary | ICD-10-CM | POA: Diagnosis not present

## 2023-02-01 DIAGNOSIS — E860 Dehydration: Secondary | ICD-10-CM | POA: Diagnosis present

## 2023-02-01 DIAGNOSIS — I110 Hypertensive heart disease with heart failure: Secondary | ICD-10-CM | POA: Diagnosis not present

## 2023-02-01 DIAGNOSIS — K219 Gastro-esophageal reflux disease without esophagitis: Secondary | ICD-10-CM | POA: Diagnosis not present

## 2023-02-01 DIAGNOSIS — Z880 Allergy status to penicillin: Secondary | ICD-10-CM | POA: Diagnosis not present

## 2023-02-01 DIAGNOSIS — K449 Diaphragmatic hernia without obstruction or gangrene: Secondary | ICD-10-CM | POA: Diagnosis present

## 2023-02-01 DIAGNOSIS — D649 Anemia, unspecified: Secondary | ICD-10-CM | POA: Diagnosis not present

## 2023-02-01 DIAGNOSIS — Z8711 Personal history of peptic ulcer disease: Secondary | ICD-10-CM | POA: Diagnosis not present

## 2023-02-01 DIAGNOSIS — I509 Heart failure, unspecified: Secondary | ICD-10-CM | POA: Diagnosis not present

## 2023-02-01 DIAGNOSIS — D500 Iron deficiency anemia secondary to blood loss (chronic): Secondary | ICD-10-CM | POA: Diagnosis not present

## 2023-02-01 DIAGNOSIS — E039 Hypothyroidism, unspecified: Secondary | ICD-10-CM | POA: Diagnosis present

## 2023-02-01 DIAGNOSIS — M810 Age-related osteoporosis without current pathological fracture: Secondary | ICD-10-CM | POA: Diagnosis present

## 2023-02-01 DIAGNOSIS — Z66 Do not resuscitate: Secondary | ICD-10-CM | POA: Diagnosis present

## 2023-02-01 DIAGNOSIS — I480 Paroxysmal atrial fibrillation: Secondary | ICD-10-CM | POA: Diagnosis present

## 2023-02-01 DIAGNOSIS — I340 Nonrheumatic mitral (valve) insufficiency: Secondary | ICD-10-CM | POA: Diagnosis not present

## 2023-02-01 DIAGNOSIS — Z885 Allergy status to narcotic agent status: Secondary | ICD-10-CM | POA: Diagnosis not present

## 2023-02-01 DIAGNOSIS — D509 Iron deficiency anemia, unspecified: Secondary | ICD-10-CM | POA: Diagnosis not present

## 2023-02-01 DIAGNOSIS — Z955 Presence of coronary angioplasty implant and graft: Secondary | ICD-10-CM | POA: Diagnosis not present

## 2023-02-01 DIAGNOSIS — I272 Pulmonary hypertension, unspecified: Secondary | ICD-10-CM | POA: Diagnosis not present

## 2023-02-01 DIAGNOSIS — Z79899 Other long term (current) drug therapy: Secondary | ICD-10-CM | POA: Diagnosis not present

## 2023-02-01 DIAGNOSIS — Z882 Allergy status to sulfonamides status: Secondary | ICD-10-CM | POA: Diagnosis not present

## 2023-02-23 DIAGNOSIS — D509 Iron deficiency anemia, unspecified: Secondary | ICD-10-CM | POA: Diagnosis not present

## 2023-02-23 DIAGNOSIS — E876 Hypokalemia: Secondary | ICD-10-CM | POA: Diagnosis not present

## 2023-02-23 DIAGNOSIS — Z09 Encounter for follow-up examination after completed treatment for conditions other than malignant neoplasm: Secondary | ICD-10-CM | POA: Diagnosis not present

## 2023-02-23 DIAGNOSIS — I4891 Unspecified atrial fibrillation: Secondary | ICD-10-CM | POA: Diagnosis not present

## 2023-02-23 DIAGNOSIS — Z6825 Body mass index (BMI) 25.0-25.9, adult: Secondary | ICD-10-CM | POA: Diagnosis not present

## 2023-02-23 DIAGNOSIS — K219 Gastro-esophageal reflux disease without esophagitis: Secondary | ICD-10-CM | POA: Diagnosis not present

## 2023-02-23 DIAGNOSIS — I5032 Chronic diastolic (congestive) heart failure: Secondary | ICD-10-CM | POA: Diagnosis not present

## 2023-02-23 DIAGNOSIS — R5381 Other malaise: Secondary | ICD-10-CM | POA: Diagnosis not present

## 2023-02-23 DIAGNOSIS — R531 Weakness: Secondary | ICD-10-CM | POA: Diagnosis not present

## 2023-02-23 DIAGNOSIS — E039 Hypothyroidism, unspecified: Secondary | ICD-10-CM | POA: Diagnosis not present

## 2023-02-24 DIAGNOSIS — I5032 Chronic diastolic (congestive) heart failure: Secondary | ICD-10-CM | POA: Diagnosis not present

## 2023-02-24 DIAGNOSIS — M810 Age-related osteoporosis without current pathological fracture: Secondary | ICD-10-CM | POA: Diagnosis not present

## 2023-02-24 DIAGNOSIS — D539 Nutritional anemia, unspecified: Secondary | ICD-10-CM | POA: Diagnosis not present

## 2023-02-24 DIAGNOSIS — E860 Dehydration: Secondary | ICD-10-CM | POA: Diagnosis not present

## 2023-02-24 DIAGNOSIS — I491 Atrial premature depolarization: Secondary | ICD-10-CM | POA: Diagnosis not present

## 2023-02-24 DIAGNOSIS — I110 Hypertensive heart disease with heart failure: Secondary | ICD-10-CM | POA: Diagnosis not present

## 2023-02-24 DIAGNOSIS — I480 Paroxysmal atrial fibrillation: Secondary | ICD-10-CM | POA: Diagnosis not present

## 2023-02-24 DIAGNOSIS — K219 Gastro-esophageal reflux disease without esophagitis: Secondary | ICD-10-CM | POA: Diagnosis not present

## 2023-02-24 DIAGNOSIS — Z955 Presence of coronary angioplasty implant and graft: Secondary | ICD-10-CM | POA: Diagnosis not present

## 2023-02-24 DIAGNOSIS — Z85118 Personal history of other malignant neoplasm of bronchus and lung: Secondary | ICD-10-CM | POA: Diagnosis not present

## 2023-02-24 DIAGNOSIS — Z7901 Long term (current) use of anticoagulants: Secondary | ICD-10-CM | POA: Diagnosis not present

## 2023-02-24 DIAGNOSIS — I493 Ventricular premature depolarization: Secondary | ICD-10-CM | POA: Diagnosis not present

## 2023-02-24 DIAGNOSIS — Z9181 History of falling: Secondary | ICD-10-CM | POA: Diagnosis not present

## 2023-02-24 DIAGNOSIS — Z7902 Long term (current) use of antithrombotics/antiplatelets: Secondary | ICD-10-CM | POA: Diagnosis not present

## 2023-02-24 DIAGNOSIS — I2510 Atherosclerotic heart disease of native coronary artery without angina pectoris: Secondary | ICD-10-CM | POA: Diagnosis not present

## 2023-02-24 DIAGNOSIS — E039 Hypothyroidism, unspecified: Secondary | ICD-10-CM | POA: Diagnosis not present

## 2023-02-24 DIAGNOSIS — D509 Iron deficiency anemia, unspecified: Secondary | ICD-10-CM | POA: Diagnosis not present

## 2023-02-24 DIAGNOSIS — E876 Hypokalemia: Secondary | ICD-10-CM | POA: Diagnosis not present

## 2023-03-03 DIAGNOSIS — E860 Dehydration: Secondary | ICD-10-CM | POA: Diagnosis not present

## 2023-03-03 DIAGNOSIS — I480 Paroxysmal atrial fibrillation: Secondary | ICD-10-CM | POA: Diagnosis not present

## 2023-03-03 DIAGNOSIS — E876 Hypokalemia: Secondary | ICD-10-CM | POA: Diagnosis not present

## 2023-03-03 DIAGNOSIS — I110 Hypertensive heart disease with heart failure: Secondary | ICD-10-CM | POA: Diagnosis not present

## 2023-03-03 DIAGNOSIS — I5032 Chronic diastolic (congestive) heart failure: Secondary | ICD-10-CM | POA: Diagnosis not present

## 2023-03-04 DIAGNOSIS — I498 Other specified cardiac arrhythmias: Secondary | ICD-10-CM | POA: Diagnosis not present

## 2023-03-04 DIAGNOSIS — R0789 Other chest pain: Secondary | ICD-10-CM | POA: Diagnosis not present

## 2023-03-04 DIAGNOSIS — R531 Weakness: Secondary | ICD-10-CM | POA: Diagnosis not present

## 2023-03-04 DIAGNOSIS — I509 Heart failure, unspecified: Secondary | ICD-10-CM | POA: Diagnosis not present

## 2023-03-04 DIAGNOSIS — R079 Chest pain, unspecified: Secondary | ICD-10-CM | POA: Diagnosis not present

## 2023-03-04 DIAGNOSIS — R9431 Abnormal electrocardiogram [ECG] [EKG]: Secondary | ICD-10-CM | POA: Diagnosis not present

## 2023-03-04 DIAGNOSIS — R072 Precordial pain: Secondary | ICD-10-CM | POA: Diagnosis not present

## 2023-03-04 DIAGNOSIS — I110 Hypertensive heart disease with heart failure: Secondary | ICD-10-CM | POA: Diagnosis not present

## 2023-03-07 DIAGNOSIS — E860 Dehydration: Secondary | ICD-10-CM | POA: Diagnosis not present

## 2023-03-07 DIAGNOSIS — I110 Hypertensive heart disease with heart failure: Secondary | ICD-10-CM | POA: Diagnosis not present

## 2023-03-07 DIAGNOSIS — E876 Hypokalemia: Secondary | ICD-10-CM | POA: Diagnosis not present

## 2023-03-07 DIAGNOSIS — I480 Paroxysmal atrial fibrillation: Secondary | ICD-10-CM | POA: Diagnosis not present

## 2023-03-07 DIAGNOSIS — I5032 Chronic diastolic (congestive) heart failure: Secondary | ICD-10-CM | POA: Diagnosis not present

## 2023-03-08 DIAGNOSIS — I10 Essential (primary) hypertension: Secondary | ICD-10-CM | POA: Diagnosis not present

## 2023-03-08 DIAGNOSIS — I480 Paroxysmal atrial fibrillation: Secondary | ICD-10-CM | POA: Diagnosis not present

## 2023-03-08 DIAGNOSIS — Z955 Presence of coronary angioplasty implant and graft: Secondary | ICD-10-CM | POA: Diagnosis not present

## 2023-03-08 DIAGNOSIS — Z7901 Long term (current) use of anticoagulants: Secondary | ICD-10-CM | POA: Diagnosis not present

## 2023-03-08 DIAGNOSIS — I2510 Atherosclerotic heart disease of native coronary artery without angina pectoris: Secondary | ICD-10-CM | POA: Diagnosis not present

## 2023-03-10 DIAGNOSIS — I480 Paroxysmal atrial fibrillation: Secondary | ICD-10-CM | POA: Diagnosis not present

## 2023-03-10 DIAGNOSIS — E860 Dehydration: Secondary | ICD-10-CM | POA: Diagnosis not present

## 2023-03-10 DIAGNOSIS — I5032 Chronic diastolic (congestive) heart failure: Secondary | ICD-10-CM | POA: Diagnosis not present

## 2023-03-10 DIAGNOSIS — I110 Hypertensive heart disease with heart failure: Secondary | ICD-10-CM | POA: Diagnosis not present

## 2023-03-10 DIAGNOSIS — E876 Hypokalemia: Secondary | ICD-10-CM | POA: Diagnosis not present

## 2023-03-17 DIAGNOSIS — E860 Dehydration: Secondary | ICD-10-CM | POA: Diagnosis not present

## 2023-03-17 DIAGNOSIS — I5032 Chronic diastolic (congestive) heart failure: Secondary | ICD-10-CM | POA: Diagnosis not present

## 2023-03-17 DIAGNOSIS — E876 Hypokalemia: Secondary | ICD-10-CM | POA: Diagnosis not present

## 2023-03-17 DIAGNOSIS — I480 Paroxysmal atrial fibrillation: Secondary | ICD-10-CM | POA: Diagnosis not present

## 2023-03-17 DIAGNOSIS — I110 Hypertensive heart disease with heart failure: Secondary | ICD-10-CM | POA: Diagnosis not present

## 2023-03-18 DIAGNOSIS — I5032 Chronic diastolic (congestive) heart failure: Secondary | ICD-10-CM | POA: Diagnosis not present

## 2023-03-18 DIAGNOSIS — I110 Hypertensive heart disease with heart failure: Secondary | ICD-10-CM | POA: Diagnosis not present

## 2023-03-18 DIAGNOSIS — I480 Paroxysmal atrial fibrillation: Secondary | ICD-10-CM | POA: Diagnosis not present

## 2023-03-18 DIAGNOSIS — E860 Dehydration: Secondary | ICD-10-CM | POA: Diagnosis not present

## 2023-03-18 DIAGNOSIS — E876 Hypokalemia: Secondary | ICD-10-CM | POA: Diagnosis not present

## 2023-03-25 DIAGNOSIS — E860 Dehydration: Secondary | ICD-10-CM | POA: Diagnosis not present

## 2023-03-25 DIAGNOSIS — I480 Paroxysmal atrial fibrillation: Secondary | ICD-10-CM | POA: Diagnosis not present

## 2023-03-25 DIAGNOSIS — I5032 Chronic diastolic (congestive) heart failure: Secondary | ICD-10-CM | POA: Diagnosis not present

## 2023-03-25 DIAGNOSIS — E876 Hypokalemia: Secondary | ICD-10-CM | POA: Diagnosis not present

## 2023-03-25 DIAGNOSIS — I110 Hypertensive heart disease with heart failure: Secondary | ICD-10-CM | POA: Diagnosis not present

## 2023-03-26 DIAGNOSIS — D509 Iron deficiency anemia, unspecified: Secondary | ICD-10-CM | POA: Diagnosis not present

## 2023-03-26 DIAGNOSIS — E039 Hypothyroidism, unspecified: Secondary | ICD-10-CM | POA: Diagnosis not present

## 2023-03-26 DIAGNOSIS — I5032 Chronic diastolic (congestive) heart failure: Secondary | ICD-10-CM | POA: Diagnosis not present

## 2023-03-26 DIAGNOSIS — Z9181 History of falling: Secondary | ICD-10-CM | POA: Diagnosis not present

## 2023-03-26 DIAGNOSIS — Z7901 Long term (current) use of anticoagulants: Secondary | ICD-10-CM | POA: Diagnosis not present

## 2023-03-26 DIAGNOSIS — I491 Atrial premature depolarization: Secondary | ICD-10-CM | POA: Diagnosis not present

## 2023-03-26 DIAGNOSIS — I110 Hypertensive heart disease with heart failure: Secondary | ICD-10-CM | POA: Diagnosis not present

## 2023-03-26 DIAGNOSIS — Z955 Presence of coronary angioplasty implant and graft: Secondary | ICD-10-CM | POA: Diagnosis not present

## 2023-03-26 DIAGNOSIS — Z85118 Personal history of other malignant neoplasm of bronchus and lung: Secondary | ICD-10-CM | POA: Diagnosis not present

## 2023-03-26 DIAGNOSIS — E860 Dehydration: Secondary | ICD-10-CM | POA: Diagnosis not present

## 2023-03-26 DIAGNOSIS — I480 Paroxysmal atrial fibrillation: Secondary | ICD-10-CM | POA: Diagnosis not present

## 2023-03-26 DIAGNOSIS — M810 Age-related osteoporosis without current pathological fracture: Secondary | ICD-10-CM | POA: Diagnosis not present

## 2023-03-26 DIAGNOSIS — D539 Nutritional anemia, unspecified: Secondary | ICD-10-CM | POA: Diagnosis not present

## 2023-03-26 DIAGNOSIS — I2510 Atherosclerotic heart disease of native coronary artery without angina pectoris: Secondary | ICD-10-CM | POA: Diagnosis not present

## 2023-03-26 DIAGNOSIS — Z7902 Long term (current) use of antithrombotics/antiplatelets: Secondary | ICD-10-CM | POA: Diagnosis not present

## 2023-03-26 DIAGNOSIS — I493 Ventricular premature depolarization: Secondary | ICD-10-CM | POA: Diagnosis not present

## 2023-03-26 DIAGNOSIS — K219 Gastro-esophageal reflux disease without esophagitis: Secondary | ICD-10-CM | POA: Diagnosis not present

## 2023-03-26 DIAGNOSIS — E876 Hypokalemia: Secondary | ICD-10-CM | POA: Diagnosis not present

## 2023-03-31 DIAGNOSIS — I110 Hypertensive heart disease with heart failure: Secondary | ICD-10-CM | POA: Diagnosis not present

## 2023-03-31 DIAGNOSIS — I5032 Chronic diastolic (congestive) heart failure: Secondary | ICD-10-CM | POA: Diagnosis not present

## 2023-03-31 DIAGNOSIS — I480 Paroxysmal atrial fibrillation: Secondary | ICD-10-CM | POA: Diagnosis not present

## 2023-03-31 DIAGNOSIS — E876 Hypokalemia: Secondary | ICD-10-CM | POA: Diagnosis not present

## 2023-03-31 DIAGNOSIS — E860 Dehydration: Secondary | ICD-10-CM | POA: Diagnosis not present

## 2023-04-06 DIAGNOSIS — I480 Paroxysmal atrial fibrillation: Secondary | ICD-10-CM | POA: Diagnosis not present

## 2023-04-06 DIAGNOSIS — I2510 Atherosclerotic heart disease of native coronary artery without angina pectoris: Secondary | ICD-10-CM | POA: Diagnosis not present

## 2023-04-13 DIAGNOSIS — E876 Hypokalemia: Secondary | ICD-10-CM | POA: Diagnosis not present

## 2023-04-13 DIAGNOSIS — R790 Abnormal level of blood mineral: Secondary | ICD-10-CM | POA: Diagnosis not present

## 2023-04-13 DIAGNOSIS — E878 Other disorders of electrolyte and fluid balance, not elsewhere classified: Secondary | ICD-10-CM | POA: Diagnosis not present

## 2023-04-21 DIAGNOSIS — R790 Abnormal level of blood mineral: Secondary | ICD-10-CM | POA: Diagnosis not present

## 2023-04-21 DIAGNOSIS — E878 Other disorders of electrolyte and fluid balance, not elsewhere classified: Secondary | ICD-10-CM | POA: Diagnosis not present

## 2023-04-21 DIAGNOSIS — E876 Hypokalemia: Secondary | ICD-10-CM | POA: Diagnosis not present

## 2023-04-26 DIAGNOSIS — Z7901 Long term (current) use of anticoagulants: Secondary | ICD-10-CM | POA: Diagnosis not present

## 2023-04-26 DIAGNOSIS — Z955 Presence of coronary angioplasty implant and graft: Secondary | ICD-10-CM | POA: Diagnosis not present

## 2023-04-26 DIAGNOSIS — I10 Essential (primary) hypertension: Secondary | ICD-10-CM | POA: Diagnosis not present

## 2023-04-26 DIAGNOSIS — I2510 Atherosclerotic heart disease of native coronary artery without angina pectoris: Secondary | ICD-10-CM | POA: Diagnosis not present

## 2023-04-26 DIAGNOSIS — E7800 Pure hypercholesterolemia, unspecified: Secondary | ICD-10-CM | POA: Diagnosis not present

## 2023-04-26 DIAGNOSIS — E876 Hypokalemia: Secondary | ICD-10-CM | POA: Diagnosis not present

## 2023-04-26 DIAGNOSIS — I480 Paroxysmal atrial fibrillation: Secondary | ICD-10-CM | POA: Diagnosis not present

## 2023-04-26 DIAGNOSIS — R600 Localized edema: Secondary | ICD-10-CM | POA: Diagnosis not present

## 2023-04-29 DIAGNOSIS — E876 Hypokalemia: Secondary | ICD-10-CM | POA: Diagnosis not present

## 2023-05-16 DIAGNOSIS — R790 Abnormal level of blood mineral: Secondary | ICD-10-CM | POA: Diagnosis not present

## 2023-05-25 DIAGNOSIS — R790 Abnormal level of blood mineral: Secondary | ICD-10-CM | POA: Diagnosis not present

## 2023-05-30 DIAGNOSIS — E878 Other disorders of electrolyte and fluid balance, not elsewhere classified: Secondary | ICD-10-CM | POA: Diagnosis not present

## 2023-05-30 DIAGNOSIS — R790 Abnormal level of blood mineral: Secondary | ICD-10-CM | POA: Diagnosis not present

## 2023-05-30 DIAGNOSIS — E876 Hypokalemia: Secondary | ICD-10-CM | POA: Diagnosis not present

## 2023-06-08 DIAGNOSIS — E876 Hypokalemia: Secondary | ICD-10-CM | POA: Diagnosis not present

## 2023-06-08 DIAGNOSIS — R790 Abnormal level of blood mineral: Secondary | ICD-10-CM | POA: Diagnosis not present

## 2023-06-08 DIAGNOSIS — E878 Other disorders of electrolyte and fluid balance, not elsewhere classified: Secondary | ICD-10-CM | POA: Diagnosis not present

## 2023-06-13 DIAGNOSIS — R790 Abnormal level of blood mineral: Secondary | ICD-10-CM | POA: Diagnosis not present

## 2023-06-13 DIAGNOSIS — E876 Hypokalemia: Secondary | ICD-10-CM | POA: Diagnosis not present

## 2023-06-13 DIAGNOSIS — E878 Other disorders of electrolyte and fluid balance, not elsewhere classified: Secondary | ICD-10-CM | POA: Diagnosis not present

## 2023-06-21 DIAGNOSIS — R790 Abnormal level of blood mineral: Secondary | ICD-10-CM | POA: Diagnosis not present

## 2023-06-27 DIAGNOSIS — R790 Abnormal level of blood mineral: Secondary | ICD-10-CM | POA: Diagnosis not present

## 2023-06-29 DIAGNOSIS — R790 Abnormal level of blood mineral: Secondary | ICD-10-CM | POA: Diagnosis not present

## 2023-06-29 DIAGNOSIS — E876 Hypokalemia: Secondary | ICD-10-CM | POA: Diagnosis not present

## 2023-06-29 DIAGNOSIS — E878 Other disorders of electrolyte and fluid balance, not elsewhere classified: Secondary | ICD-10-CM | POA: Diagnosis not present

## 2023-07-07 DIAGNOSIS — E878 Other disorders of electrolyte and fluid balance, not elsewhere classified: Secondary | ICD-10-CM | POA: Diagnosis not present

## 2023-07-07 DIAGNOSIS — R790 Abnormal level of blood mineral: Secondary | ICD-10-CM | POA: Diagnosis not present

## 2023-07-07 DIAGNOSIS — E876 Hypokalemia: Secondary | ICD-10-CM | POA: Diagnosis not present

## 2023-07-14 DIAGNOSIS — K219 Gastro-esophageal reflux disease without esophagitis: Secondary | ICD-10-CM | POA: Diagnosis not present

## 2023-07-14 DIAGNOSIS — I10 Essential (primary) hypertension: Secondary | ICD-10-CM | POA: Diagnosis not present

## 2023-07-14 DIAGNOSIS — E039 Hypothyroidism, unspecified: Secondary | ICD-10-CM | POA: Diagnosis not present

## 2023-07-14 DIAGNOSIS — E7800 Pure hypercholesterolemia, unspecified: Secondary | ICD-10-CM | POA: Diagnosis not present

## 2023-07-18 DIAGNOSIS — E878 Other disorders of electrolyte and fluid balance, not elsewhere classified: Secondary | ICD-10-CM | POA: Diagnosis not present

## 2023-07-18 DIAGNOSIS — E876 Hypokalemia: Secondary | ICD-10-CM | POA: Diagnosis not present

## 2023-07-18 DIAGNOSIS — R790 Abnormal level of blood mineral: Secondary | ICD-10-CM | POA: Diagnosis not present

## 2023-07-28 DIAGNOSIS — R790 Abnormal level of blood mineral: Secondary | ICD-10-CM | POA: Diagnosis not present

## 2023-07-28 DIAGNOSIS — E876 Hypokalemia: Secondary | ICD-10-CM | POA: Diagnosis not present

## 2023-07-28 DIAGNOSIS — E878 Other disorders of electrolyte and fluid balance, not elsewhere classified: Secondary | ICD-10-CM | POA: Diagnosis not present

## 2023-08-02 DIAGNOSIS — E878 Other disorders of electrolyte and fluid balance, not elsewhere classified: Secondary | ICD-10-CM | POA: Diagnosis not present

## 2023-08-02 DIAGNOSIS — R790 Abnormal level of blood mineral: Secondary | ICD-10-CM | POA: Diagnosis not present

## 2023-08-02 DIAGNOSIS — E876 Hypokalemia: Secondary | ICD-10-CM | POA: Diagnosis not present

## 2023-08-12 DIAGNOSIS — E876 Hypokalemia: Secondary | ICD-10-CM | POA: Diagnosis not present

## 2023-08-12 DIAGNOSIS — E878 Other disorders of electrolyte and fluid balance, not elsewhere classified: Secondary | ICD-10-CM | POA: Diagnosis not present

## 2023-08-12 DIAGNOSIS — R790 Abnormal level of blood mineral: Secondary | ICD-10-CM | POA: Diagnosis not present

## 2023-08-16 DIAGNOSIS — I2510 Atherosclerotic heart disease of native coronary artery without angina pectoris: Secondary | ICD-10-CM | POA: Diagnosis not present

## 2023-08-16 DIAGNOSIS — E785 Hyperlipidemia, unspecified: Secondary | ICD-10-CM | POA: Diagnosis not present

## 2023-08-16 DIAGNOSIS — I10 Essential (primary) hypertension: Secondary | ICD-10-CM | POA: Diagnosis not present

## 2023-08-16 DIAGNOSIS — Z7901 Long term (current) use of anticoagulants: Secondary | ICD-10-CM | POA: Diagnosis not present

## 2023-08-16 DIAGNOSIS — E876 Hypokalemia: Secondary | ICD-10-CM | POA: Diagnosis not present

## 2023-08-16 DIAGNOSIS — Z955 Presence of coronary angioplasty implant and graft: Secondary | ICD-10-CM | POA: Diagnosis not present

## 2023-08-16 DIAGNOSIS — I480 Paroxysmal atrial fibrillation: Secondary | ICD-10-CM | POA: Diagnosis not present

## 2023-08-22 DIAGNOSIS — N182 Chronic kidney disease, stage 2 (mild): Secondary | ICD-10-CM | POA: Diagnosis not present

## 2023-08-22 DIAGNOSIS — N390 Urinary tract infection, site not specified: Secondary | ICD-10-CM | POA: Diagnosis not present

## 2023-08-22 DIAGNOSIS — E876 Hypokalemia: Secondary | ICD-10-CM | POA: Diagnosis not present

## 2023-08-29 DIAGNOSIS — I2510 Atherosclerotic heart disease of native coronary artery without angina pectoris: Secondary | ICD-10-CM | POA: Diagnosis not present

## 2023-08-29 DIAGNOSIS — Z955 Presence of coronary angioplasty implant and graft: Secondary | ICD-10-CM | POA: Diagnosis not present

## 2023-09-05 ENCOUNTER — Other Ambulatory Visit: Payer: Self-pay | Admitting: Cardiology

## 2023-09-21 DIAGNOSIS — E878 Other disorders of electrolyte and fluid balance, not elsewhere classified: Secondary | ICD-10-CM | POA: Diagnosis not present

## 2023-09-21 DIAGNOSIS — E876 Hypokalemia: Secondary | ICD-10-CM | POA: Diagnosis not present

## 2023-09-21 DIAGNOSIS — R790 Abnormal level of blood mineral: Secondary | ICD-10-CM | POA: Diagnosis not present

## 2023-09-26 DIAGNOSIS — E878 Other disorders of electrolyte and fluid balance, not elsewhere classified: Secondary | ICD-10-CM | POA: Diagnosis not present

## 2023-09-26 DIAGNOSIS — E876 Hypokalemia: Secondary | ICD-10-CM | POA: Diagnosis not present

## 2023-09-26 DIAGNOSIS — R790 Abnormal level of blood mineral: Secondary | ICD-10-CM | POA: Diagnosis not present

## 2023-10-10 DIAGNOSIS — R790 Abnormal level of blood mineral: Secondary | ICD-10-CM | POA: Diagnosis not present

## 2023-10-10 DIAGNOSIS — E878 Other disorders of electrolyte and fluid balance, not elsewhere classified: Secondary | ICD-10-CM | POA: Diagnosis not present

## 2023-10-10 DIAGNOSIS — E876 Hypokalemia: Secondary | ICD-10-CM | POA: Diagnosis not present

## 2023-10-21 DIAGNOSIS — R790 Abnormal level of blood mineral: Secondary | ICD-10-CM | POA: Diagnosis not present

## 2023-10-25 DIAGNOSIS — Z7901 Long term (current) use of anticoagulants: Secondary | ICD-10-CM | POA: Diagnosis not present

## 2023-10-25 DIAGNOSIS — Z1330 Encounter for screening examination for mental health and behavioral disorders, unspecified: Secondary | ICD-10-CM | POA: Diagnosis not present

## 2023-10-25 DIAGNOSIS — E876 Hypokalemia: Secondary | ICD-10-CM | POA: Diagnosis not present

## 2023-10-25 DIAGNOSIS — I480 Paroxysmal atrial fibrillation: Secondary | ICD-10-CM | POA: Diagnosis not present

## 2023-10-25 DIAGNOSIS — I509 Heart failure, unspecified: Secondary | ICD-10-CM | POA: Diagnosis not present

## 2023-10-25 DIAGNOSIS — E878 Other disorders of electrolyte and fluid balance, not elsewhere classified: Secondary | ICD-10-CM | POA: Diagnosis not present

## 2023-10-25 DIAGNOSIS — R790 Abnormal level of blood mineral: Secondary | ICD-10-CM | POA: Diagnosis not present

## 2023-10-25 DIAGNOSIS — E782 Mixed hyperlipidemia: Secondary | ICD-10-CM | POA: Diagnosis not present

## 2023-10-25 DIAGNOSIS — R609 Edema, unspecified: Secondary | ICD-10-CM | POA: Diagnosis not present

## 2023-10-25 DIAGNOSIS — Z955 Presence of coronary angioplasty implant and graft: Secondary | ICD-10-CM | POA: Diagnosis not present

## 2023-10-25 DIAGNOSIS — I2510 Atherosclerotic heart disease of native coronary artery without angina pectoris: Secondary | ICD-10-CM | POA: Diagnosis not present

## 2023-10-25 DIAGNOSIS — I10 Essential (primary) hypertension: Secondary | ICD-10-CM | POA: Diagnosis not present

## 2023-10-25 DIAGNOSIS — I5032 Chronic diastolic (congestive) heart failure: Secondary | ICD-10-CM | POA: Diagnosis not present

## 2023-11-03 DIAGNOSIS — R790 Abnormal level of blood mineral: Secondary | ICD-10-CM | POA: Diagnosis not present

## 2023-11-17 DIAGNOSIS — Z888 Allergy status to other drugs, medicaments and biological substances status: Secondary | ICD-10-CM | POA: Diagnosis not present

## 2023-11-17 DIAGNOSIS — Z7901 Long term (current) use of anticoagulants: Secondary | ICD-10-CM | POA: Diagnosis not present

## 2023-11-17 DIAGNOSIS — I110 Hypertensive heart disease with heart failure: Secondary | ICD-10-CM | POA: Diagnosis present

## 2023-11-17 DIAGNOSIS — R600 Localized edema: Secondary | ICD-10-CM | POA: Diagnosis not present

## 2023-11-17 DIAGNOSIS — I5032 Chronic diastolic (congestive) heart failure: Secondary | ICD-10-CM | POA: Diagnosis present

## 2023-11-17 DIAGNOSIS — Z79899 Other long term (current) drug therapy: Secondary | ICD-10-CM | POA: Diagnosis not present

## 2023-11-17 DIAGNOSIS — D500 Iron deficiency anemia secondary to blood loss (chronic): Secondary | ICD-10-CM | POA: Diagnosis present

## 2023-11-17 DIAGNOSIS — Z7902 Long term (current) use of antithrombotics/antiplatelets: Secondary | ICD-10-CM | POA: Diagnosis not present

## 2023-11-17 DIAGNOSIS — R079 Chest pain, unspecified: Secondary | ICD-10-CM | POA: Diagnosis not present

## 2023-11-17 DIAGNOSIS — I2510 Atherosclerotic heart disease of native coronary artery without angina pectoris: Secondary | ICD-10-CM | POA: Diagnosis present

## 2023-11-17 DIAGNOSIS — R5383 Other fatigue: Secondary | ICD-10-CM | POA: Diagnosis present

## 2023-11-17 DIAGNOSIS — E039 Hypothyroidism, unspecified: Secondary | ICD-10-CM | POA: Diagnosis present

## 2023-11-17 DIAGNOSIS — I10 Essential (primary) hypertension: Secondary | ICD-10-CM | POA: Diagnosis not present

## 2023-11-17 DIAGNOSIS — R197 Diarrhea, unspecified: Secondary | ICD-10-CM | POA: Diagnosis present

## 2023-11-17 DIAGNOSIS — E876 Hypokalemia: Secondary | ICD-10-CM | POA: Diagnosis present

## 2023-11-17 DIAGNOSIS — I517 Cardiomegaly: Secondary | ICD-10-CM | POA: Diagnosis not present

## 2023-11-17 DIAGNOSIS — Z7409 Other reduced mobility: Secondary | ICD-10-CM | POA: Diagnosis present

## 2023-11-17 DIAGNOSIS — Z882 Allergy status to sulfonamides status: Secondary | ICD-10-CM | POA: Diagnosis not present

## 2023-11-17 DIAGNOSIS — Z955 Presence of coronary angioplasty implant and graft: Secondary | ICD-10-CM | POA: Diagnosis not present

## 2023-11-17 DIAGNOSIS — Z79890 Hormone replacement therapy (postmenopausal): Secondary | ICD-10-CM | POA: Diagnosis not present

## 2023-11-17 DIAGNOSIS — I480 Paroxysmal atrial fibrillation: Secondary | ICD-10-CM | POA: Diagnosis present

## 2023-11-17 DIAGNOSIS — Z880 Allergy status to penicillin: Secondary | ICD-10-CM | POA: Diagnosis not present

## 2023-11-17 DIAGNOSIS — E785 Hyperlipidemia, unspecified: Secondary | ICD-10-CM | POA: Diagnosis present

## 2023-11-17 DIAGNOSIS — I160 Hypertensive urgency: Secondary | ICD-10-CM | POA: Diagnosis present

## 2023-11-17 DIAGNOSIS — R531 Weakness: Secondary | ICD-10-CM | POA: Diagnosis present

## 2023-11-17 DIAGNOSIS — M7122 Synovial cyst of popliteal space [Baker], left knee: Secondary | ICD-10-CM | POA: Diagnosis not present

## 2023-11-17 DIAGNOSIS — M1990 Unspecified osteoarthritis, unspecified site: Secondary | ICD-10-CM | POA: Diagnosis present

## 2023-11-17 DIAGNOSIS — Z885 Allergy status to narcotic agent status: Secondary | ICD-10-CM | POA: Diagnosis not present

## 2023-11-18 DIAGNOSIS — E785 Hyperlipidemia, unspecified: Secondary | ICD-10-CM | POA: Diagnosis present

## 2023-11-18 DIAGNOSIS — Z880 Allergy status to penicillin: Secondary | ICD-10-CM | POA: Diagnosis not present

## 2023-11-18 DIAGNOSIS — R197 Diarrhea, unspecified: Secondary | ICD-10-CM | POA: Diagnosis present

## 2023-11-18 DIAGNOSIS — E876 Hypokalemia: Secondary | ICD-10-CM | POA: Diagnosis present

## 2023-11-18 DIAGNOSIS — I10 Essential (primary) hypertension: Secondary | ICD-10-CM | POA: Diagnosis not present

## 2023-11-18 DIAGNOSIS — E039 Hypothyroidism, unspecified: Secondary | ICD-10-CM | POA: Diagnosis present

## 2023-11-18 DIAGNOSIS — I160 Hypertensive urgency: Secondary | ICD-10-CM | POA: Diagnosis present

## 2023-11-18 DIAGNOSIS — I5032 Chronic diastolic (congestive) heart failure: Secondary | ICD-10-CM | POA: Diagnosis present

## 2023-11-18 DIAGNOSIS — Z7901 Long term (current) use of anticoagulants: Secondary | ICD-10-CM | POA: Diagnosis not present

## 2023-11-18 DIAGNOSIS — Z955 Presence of coronary angioplasty implant and graft: Secondary | ICD-10-CM | POA: Diagnosis not present

## 2023-11-18 DIAGNOSIS — I110 Hypertensive heart disease with heart failure: Secondary | ICD-10-CM | POA: Diagnosis present

## 2023-11-18 DIAGNOSIS — Z888 Allergy status to other drugs, medicaments and biological substances status: Secondary | ICD-10-CM | POA: Diagnosis not present

## 2023-11-18 DIAGNOSIS — Z885 Allergy status to narcotic agent status: Secondary | ICD-10-CM | POA: Diagnosis not present

## 2023-11-18 DIAGNOSIS — Z7409 Other reduced mobility: Secondary | ICD-10-CM | POA: Diagnosis present

## 2023-11-18 DIAGNOSIS — Z79890 Hormone replacement therapy (postmenopausal): Secondary | ICD-10-CM | POA: Diagnosis not present

## 2023-11-18 DIAGNOSIS — I2510 Atherosclerotic heart disease of native coronary artery without angina pectoris: Secondary | ICD-10-CM | POA: Diagnosis present

## 2023-11-18 DIAGNOSIS — I480 Paroxysmal atrial fibrillation: Secondary | ICD-10-CM | POA: Diagnosis present

## 2023-11-18 DIAGNOSIS — R5383 Other fatigue: Secondary | ICD-10-CM | POA: Diagnosis present

## 2023-11-18 DIAGNOSIS — D500 Iron deficiency anemia secondary to blood loss (chronic): Secondary | ICD-10-CM | POA: Diagnosis present

## 2023-11-18 DIAGNOSIS — R531 Weakness: Secondary | ICD-10-CM | POA: Diagnosis present

## 2023-11-18 DIAGNOSIS — M1990 Unspecified osteoarthritis, unspecified site: Secondary | ICD-10-CM | POA: Diagnosis present

## 2023-11-18 DIAGNOSIS — Z79899 Other long term (current) drug therapy: Secondary | ICD-10-CM | POA: Diagnosis not present

## 2023-11-18 DIAGNOSIS — Z7902 Long term (current) use of antithrombotics/antiplatelets: Secondary | ICD-10-CM | POA: Diagnosis not present

## 2023-11-18 DIAGNOSIS — Z882 Allergy status to sulfonamides status: Secondary | ICD-10-CM | POA: Diagnosis not present

## 2023-12-01 DIAGNOSIS — E039 Hypothyroidism, unspecified: Secondary | ICD-10-CM | POA: Diagnosis not present

## 2023-12-01 DIAGNOSIS — I10 Essential (primary) hypertension: Secondary | ICD-10-CM | POA: Diagnosis not present

## 2023-12-01 DIAGNOSIS — D509 Iron deficiency anemia, unspecified: Secondary | ICD-10-CM | POA: Diagnosis not present

## 2023-12-01 DIAGNOSIS — Z0000 Encounter for general adult medical examination without abnormal findings: Secondary | ICD-10-CM | POA: Diagnosis not present

## 2023-12-01 DIAGNOSIS — K219 Gastro-esophageal reflux disease without esophagitis: Secondary | ICD-10-CM | POA: Diagnosis not present

## 2023-12-06 DIAGNOSIS — E878 Other disorders of electrolyte and fluid balance, not elsewhere classified: Secondary | ICD-10-CM | POA: Diagnosis not present

## 2023-12-06 DIAGNOSIS — E876 Hypokalemia: Secondary | ICD-10-CM | POA: Diagnosis not present

## 2023-12-06 DIAGNOSIS — R790 Abnormal level of blood mineral: Secondary | ICD-10-CM | POA: Diagnosis not present

## 2023-12-13 DIAGNOSIS — E876 Hypokalemia: Secondary | ICD-10-CM | POA: Diagnosis not present

## 2023-12-13 DIAGNOSIS — E878 Other disorders of electrolyte and fluid balance, not elsewhere classified: Secondary | ICD-10-CM | POA: Diagnosis not present

## 2023-12-13 DIAGNOSIS — R790 Abnormal level of blood mineral: Secondary | ICD-10-CM | POA: Diagnosis not present

## 2024-01-05 DIAGNOSIS — Z7902 Long term (current) use of antithrombotics/antiplatelets: Secondary | ICD-10-CM | POA: Diagnosis not present

## 2024-01-05 DIAGNOSIS — S0990XA Unspecified injury of head, initial encounter: Secondary | ICD-10-CM | POA: Diagnosis not present

## 2024-01-05 DIAGNOSIS — Z79899 Other long term (current) drug therapy: Secondary | ICD-10-CM | POA: Diagnosis not present

## 2024-01-05 DIAGNOSIS — S0083XA Contusion of other part of head, initial encounter: Secondary | ICD-10-CM | POA: Diagnosis not present

## 2024-01-05 DIAGNOSIS — M25512 Pain in left shoulder: Secondary | ICD-10-CM | POA: Diagnosis not present

## 2024-01-05 DIAGNOSIS — S0003XA Contusion of scalp, initial encounter: Secondary | ICD-10-CM | POA: Diagnosis not present

## 2024-01-05 DIAGNOSIS — R55 Syncope and collapse: Secondary | ICD-10-CM | POA: Diagnosis not present

## 2024-01-05 DIAGNOSIS — W19XXXA Unspecified fall, initial encounter: Secondary | ICD-10-CM | POA: Diagnosis not present

## 2024-01-05 DIAGNOSIS — Z7901 Long term (current) use of anticoagulants: Secondary | ICD-10-CM | POA: Diagnosis not present

## 2024-01-05 DIAGNOSIS — I4891 Unspecified atrial fibrillation: Secondary | ICD-10-CM | POA: Diagnosis not present

## 2024-01-05 DIAGNOSIS — S7011XA Contusion of right thigh, initial encounter: Secondary | ICD-10-CM | POA: Diagnosis not present

## 2024-01-17 DIAGNOSIS — R790 Abnormal level of blood mineral: Secondary | ICD-10-CM | POA: Diagnosis not present

## 2024-03-23 DIAGNOSIS — Z888 Allergy status to other drugs, medicaments and biological substances status: Secondary | ICD-10-CM | POA: Diagnosis not present

## 2024-03-23 DIAGNOSIS — I480 Paroxysmal atrial fibrillation: Secondary | ICD-10-CM | POA: Diagnosis not present

## 2024-03-23 DIAGNOSIS — Z7409 Other reduced mobility: Secondary | ICD-10-CM | POA: Diagnosis not present

## 2024-03-23 DIAGNOSIS — R531 Weakness: Secondary | ICD-10-CM | POA: Diagnosis not present

## 2024-03-23 DIAGNOSIS — Z7902 Long term (current) use of antithrombotics/antiplatelets: Secondary | ICD-10-CM | POA: Diagnosis not present

## 2024-03-23 DIAGNOSIS — Z79890 Hormone replacement therapy (postmenopausal): Secondary | ICD-10-CM | POA: Diagnosis not present

## 2024-03-23 DIAGNOSIS — I6529 Occlusion and stenosis of unspecified carotid artery: Secondary | ICD-10-CM | POA: Diagnosis not present

## 2024-03-23 DIAGNOSIS — D649 Anemia, unspecified: Secondary | ICD-10-CM | POA: Diagnosis not present

## 2024-03-23 DIAGNOSIS — I491 Atrial premature depolarization: Secondary | ICD-10-CM | POA: Diagnosis not present

## 2024-03-23 DIAGNOSIS — E039 Hypothyroidism, unspecified: Secondary | ICD-10-CM | POA: Diagnosis not present

## 2024-03-23 DIAGNOSIS — R54 Age-related physical debility: Secondary | ICD-10-CM | POA: Diagnosis not present

## 2024-03-23 DIAGNOSIS — I5032 Chronic diastolic (congestive) heart failure: Secondary | ICD-10-CM | POA: Diagnosis not present

## 2024-03-23 DIAGNOSIS — Z955 Presence of coronary angioplasty implant and graft: Secondary | ICD-10-CM | POA: Diagnosis not present

## 2024-03-23 DIAGNOSIS — I160 Hypertensive urgency: Secondary | ICD-10-CM | POA: Diagnosis not present

## 2024-03-23 DIAGNOSIS — I2510 Atherosclerotic heart disease of native coronary artery without angina pectoris: Secondary | ICD-10-CM | POA: Diagnosis not present

## 2024-03-23 DIAGNOSIS — I5033 Acute on chronic diastolic (congestive) heart failure: Secondary | ICD-10-CM | POA: Diagnosis not present

## 2024-03-23 DIAGNOSIS — I110 Hypertensive heart disease with heart failure: Secondary | ICD-10-CM | POA: Diagnosis not present

## 2024-03-23 DIAGNOSIS — I779 Disorder of arteries and arterioles, unspecified: Secondary | ICD-10-CM | POA: Diagnosis not present

## 2024-03-23 DIAGNOSIS — I6522 Occlusion and stenosis of left carotid artery: Secondary | ICD-10-CM | POA: Diagnosis not present

## 2024-03-23 DIAGNOSIS — Z880 Allergy status to penicillin: Secondary | ICD-10-CM | POA: Diagnosis not present

## 2024-03-23 DIAGNOSIS — Z7901 Long term (current) use of anticoagulants: Secondary | ICD-10-CM | POA: Diagnosis not present

## 2024-03-23 DIAGNOSIS — E7800 Pure hypercholesterolemia, unspecified: Secondary | ICD-10-CM | POA: Diagnosis not present

## 2024-03-23 DIAGNOSIS — R7303 Prediabetes: Secondary | ICD-10-CM | POA: Diagnosis not present

## 2024-03-23 DIAGNOSIS — I509 Heart failure, unspecified: Secondary | ICD-10-CM | POA: Diagnosis not present

## 2024-03-23 DIAGNOSIS — I10 Essential (primary) hypertension: Secondary | ICD-10-CM | POA: Diagnosis not present

## 2024-03-23 DIAGNOSIS — I083 Combined rheumatic disorders of mitral, aortic and tricuspid valves: Secondary | ICD-10-CM | POA: Diagnosis not present

## 2024-03-23 DIAGNOSIS — Z885 Allergy status to narcotic agent status: Secondary | ICD-10-CM | POA: Diagnosis not present

## 2024-03-23 DIAGNOSIS — Z882 Allergy status to sulfonamides status: Secondary | ICD-10-CM | POA: Diagnosis not present

## 2024-03-23 DIAGNOSIS — K219 Gastro-esophageal reflux disease without esophagitis: Secondary | ICD-10-CM | POA: Diagnosis present

## 2024-03-23 DIAGNOSIS — R079 Chest pain, unspecified: Secondary | ICD-10-CM | POA: Diagnosis not present

## 2024-03-23 DIAGNOSIS — Z887 Allergy status to serum and vaccine status: Secondary | ICD-10-CM | POA: Diagnosis not present

## 2024-03-23 DIAGNOSIS — Z8679 Personal history of other diseases of the circulatory system: Secondary | ICD-10-CM | POA: Diagnosis not present

## 2024-03-23 DIAGNOSIS — Z79899 Other long term (current) drug therapy: Secondary | ICD-10-CM | POA: Diagnosis not present

## 2024-03-23 DIAGNOSIS — R609 Edema, unspecified: Secondary | ICD-10-CM | POA: Diagnosis not present

## 2024-03-23 DIAGNOSIS — R002 Palpitations: Secondary | ICD-10-CM | POA: Diagnosis not present

## 2024-03-29 DIAGNOSIS — Z7901 Long term (current) use of anticoagulants: Secondary | ICD-10-CM | POA: Diagnosis not present

## 2024-03-29 DIAGNOSIS — I480 Paroxysmal atrial fibrillation: Secondary | ICD-10-CM | POA: Diagnosis not present

## 2024-03-29 DIAGNOSIS — I5032 Chronic diastolic (congestive) heart failure: Secondary | ICD-10-CM | POA: Diagnosis not present

## 2024-04-02 DIAGNOSIS — Z9181 History of falling: Secondary | ICD-10-CM | POA: Diagnosis not present

## 2024-04-02 DIAGNOSIS — I5032 Chronic diastolic (congestive) heart failure: Secondary | ICD-10-CM | POA: Diagnosis not present

## 2024-04-02 DIAGNOSIS — I480 Paroxysmal atrial fibrillation: Secondary | ICD-10-CM | POA: Diagnosis not present

## 2024-04-03 DIAGNOSIS — I160 Hypertensive urgency: Secondary | ICD-10-CM | POA: Diagnosis not present

## 2024-04-03 DIAGNOSIS — I110 Hypertensive heart disease with heart failure: Secondary | ICD-10-CM | POA: Diagnosis not present

## 2024-04-04 DIAGNOSIS — I160 Hypertensive urgency: Secondary | ICD-10-CM | POA: Diagnosis not present

## 2024-04-04 DIAGNOSIS — I110 Hypertensive heart disease with heart failure: Secondary | ICD-10-CM | POA: Diagnosis not present

## 2024-04-05 DIAGNOSIS — I110 Hypertensive heart disease with heart failure: Secondary | ICD-10-CM | POA: Diagnosis not present

## 2024-04-05 DIAGNOSIS — I160 Hypertensive urgency: Secondary | ICD-10-CM | POA: Diagnosis not present

## 2024-04-06 DIAGNOSIS — I110 Hypertensive heart disease with heart failure: Secondary | ICD-10-CM | POA: Diagnosis not present

## 2024-04-06 DIAGNOSIS — I160 Hypertensive urgency: Secondary | ICD-10-CM | POA: Diagnosis not present

## 2024-04-12 DIAGNOSIS — I110 Hypertensive heart disease with heart failure: Secondary | ICD-10-CM | POA: Diagnosis not present

## 2024-04-12 DIAGNOSIS — I160 Hypertensive urgency: Secondary | ICD-10-CM | POA: Diagnosis not present

## 2024-04-13 DIAGNOSIS — I110 Hypertensive heart disease with heart failure: Secondary | ICD-10-CM | POA: Diagnosis not present

## 2024-04-13 DIAGNOSIS — I160 Hypertensive urgency: Secondary | ICD-10-CM | POA: Diagnosis not present

## 2024-04-16 DIAGNOSIS — I160 Hypertensive urgency: Secondary | ICD-10-CM | POA: Diagnosis not present

## 2024-04-16 DIAGNOSIS — I110 Hypertensive heart disease with heart failure: Secondary | ICD-10-CM | POA: Diagnosis not present

## 2024-04-18 DIAGNOSIS — I160 Hypertensive urgency: Secondary | ICD-10-CM | POA: Diagnosis not present

## 2024-04-18 DIAGNOSIS — I110 Hypertensive heart disease with heart failure: Secondary | ICD-10-CM | POA: Diagnosis not present

## 2024-04-19 DIAGNOSIS — I160 Hypertensive urgency: Secondary | ICD-10-CM | POA: Diagnosis not present

## 2024-04-19 DIAGNOSIS — I110 Hypertensive heart disease with heart failure: Secondary | ICD-10-CM | POA: Diagnosis not present

## 2024-04-23 DIAGNOSIS — I110 Hypertensive heart disease with heart failure: Secondary | ICD-10-CM | POA: Diagnosis not present

## 2024-04-23 DIAGNOSIS — I160 Hypertensive urgency: Secondary | ICD-10-CM | POA: Diagnosis not present

## 2024-04-25 DIAGNOSIS — I160 Hypertensive urgency: Secondary | ICD-10-CM | POA: Diagnosis not present

## 2024-04-25 DIAGNOSIS — I110 Hypertensive heart disease with heart failure: Secondary | ICD-10-CM | POA: Diagnosis not present

## 2024-04-26 DIAGNOSIS — I110 Hypertensive heart disease with heart failure: Secondary | ICD-10-CM | POA: Diagnosis not present

## 2024-04-26 DIAGNOSIS — I160 Hypertensive urgency: Secondary | ICD-10-CM | POA: Diagnosis not present

## 2024-04-30 DIAGNOSIS — I110 Hypertensive heart disease with heart failure: Secondary | ICD-10-CM | POA: Diagnosis not present

## 2024-04-30 DIAGNOSIS — I160 Hypertensive urgency: Secondary | ICD-10-CM | POA: Diagnosis not present

## 2024-05-02 DIAGNOSIS — R002 Palpitations: Secondary | ICD-10-CM | POA: Diagnosis not present

## 2024-05-02 DIAGNOSIS — I160 Hypertensive urgency: Secondary | ICD-10-CM | POA: Diagnosis not present

## 2024-05-02 DIAGNOSIS — R0789 Other chest pain: Secondary | ICD-10-CM | POA: Diagnosis not present

## 2024-05-02 DIAGNOSIS — Z7901 Long term (current) use of anticoagulants: Secondary | ICD-10-CM | POA: Diagnosis not present

## 2024-05-02 DIAGNOSIS — R509 Fever, unspecified: Secondary | ICD-10-CM | POA: Diagnosis not present

## 2024-05-02 DIAGNOSIS — R7989 Other specified abnormal findings of blood chemistry: Secondary | ICD-10-CM | POA: Diagnosis not present

## 2024-05-02 DIAGNOSIS — I110 Hypertensive heart disease with heart failure: Secondary | ICD-10-CM | POA: Diagnosis not present

## 2024-05-02 DIAGNOSIS — K219 Gastro-esophageal reflux disease without esophagitis: Secondary | ICD-10-CM | POA: Diagnosis not present

## 2024-05-02 DIAGNOSIS — R079 Chest pain, unspecified: Secondary | ICD-10-CM | POA: Diagnosis not present

## 2024-05-02 DIAGNOSIS — Z79899 Other long term (current) drug therapy: Secondary | ICD-10-CM | POA: Diagnosis not present

## 2024-05-02 DIAGNOSIS — E079 Disorder of thyroid, unspecified: Secondary | ICD-10-CM | POA: Diagnosis not present

## 2024-05-02 DIAGNOSIS — R000 Tachycardia, unspecified: Secondary | ICD-10-CM | POA: Diagnosis not present

## 2024-05-02 DIAGNOSIS — I509 Heart failure, unspecified: Secondary | ICD-10-CM | POA: Diagnosis not present

## 2024-05-02 DIAGNOSIS — I10 Essential (primary) hypertension: Secondary | ICD-10-CM | POA: Diagnosis not present

## 2024-05-02 DIAGNOSIS — I4891 Unspecified atrial fibrillation: Secondary | ICD-10-CM | POA: Diagnosis not present

## 2024-05-02 DIAGNOSIS — I517 Cardiomegaly: Secondary | ICD-10-CM | POA: Diagnosis not present

## 2024-05-02 DIAGNOSIS — R9431 Abnormal electrocardiogram [ECG] [EKG]: Secondary | ICD-10-CM | POA: Diagnosis not present

## 2024-05-03 DIAGNOSIS — I5033 Acute on chronic diastolic (congestive) heart failure: Secondary | ICD-10-CM | POA: Diagnosis not present

## 2024-05-03 DIAGNOSIS — I110 Hypertensive heart disease with heart failure: Secondary | ICD-10-CM | POA: Diagnosis not present

## 2024-05-03 DIAGNOSIS — M810 Age-related osteoporosis without current pathological fracture: Secondary | ICD-10-CM | POA: Diagnosis not present

## 2024-05-03 DIAGNOSIS — Z7901 Long term (current) use of anticoagulants: Secondary | ICD-10-CM | POA: Diagnosis not present

## 2024-05-03 DIAGNOSIS — Z7902 Long term (current) use of antithrombotics/antiplatelets: Secondary | ICD-10-CM | POA: Diagnosis not present

## 2024-05-03 DIAGNOSIS — I2510 Atherosclerotic heart disease of native coronary artery without angina pectoris: Secondary | ICD-10-CM | POA: Diagnosis not present

## 2024-05-03 DIAGNOSIS — I160 Hypertensive urgency: Secondary | ICD-10-CM | POA: Diagnosis not present

## 2024-05-03 DIAGNOSIS — K219 Gastro-esophageal reflux disease without esophagitis: Secondary | ICD-10-CM | POA: Diagnosis not present

## 2024-05-03 DIAGNOSIS — E7800 Pure hypercholesterolemia, unspecified: Secondary | ICD-10-CM | POA: Diagnosis not present

## 2024-05-03 DIAGNOSIS — E039 Hypothyroidism, unspecified: Secondary | ICD-10-CM | POA: Diagnosis not present

## 2024-05-03 DIAGNOSIS — I480 Paroxysmal atrial fibrillation: Secondary | ICD-10-CM | POA: Diagnosis not present

## 2024-05-03 DIAGNOSIS — Z955 Presence of coronary angioplasty implant and graft: Secondary | ICD-10-CM | POA: Diagnosis not present

## 2024-05-03 DIAGNOSIS — Z9181 History of falling: Secondary | ICD-10-CM | POA: Diagnosis not present

## 2024-05-03 DIAGNOSIS — D509 Iron deficiency anemia, unspecified: Secondary | ICD-10-CM | POA: Diagnosis not present

## 2024-05-03 DIAGNOSIS — R011 Cardiac murmur, unspecified: Secondary | ICD-10-CM | POA: Diagnosis not present

## 2024-05-03 DIAGNOSIS — R7303 Prediabetes: Secondary | ICD-10-CM | POA: Diagnosis not present

## 2024-05-03 DIAGNOSIS — I6522 Occlusion and stenosis of left carotid artery: Secondary | ICD-10-CM | POA: Diagnosis not present

## 2024-05-03 DIAGNOSIS — Z85118 Personal history of other malignant neoplasm of bronchus and lung: Secondary | ICD-10-CM | POA: Diagnosis not present

## 2024-05-08 DIAGNOSIS — I110 Hypertensive heart disease with heart failure: Secondary | ICD-10-CM | POA: Diagnosis not present

## 2024-05-08 DIAGNOSIS — I160 Hypertensive urgency: Secondary | ICD-10-CM | POA: Diagnosis not present

## 2024-05-08 DIAGNOSIS — I5033 Acute on chronic diastolic (congestive) heart failure: Secondary | ICD-10-CM | POA: Diagnosis not present

## 2024-05-08 DIAGNOSIS — E7800 Pure hypercholesterolemia, unspecified: Secondary | ICD-10-CM | POA: Diagnosis not present

## 2024-05-08 DIAGNOSIS — I2510 Atherosclerotic heart disease of native coronary artery without angina pectoris: Secondary | ICD-10-CM | POA: Diagnosis not present

## 2024-05-08 DIAGNOSIS — I480 Paroxysmal atrial fibrillation: Secondary | ICD-10-CM | POA: Diagnosis not present

## 2024-05-11 DIAGNOSIS — I160 Hypertensive urgency: Secondary | ICD-10-CM | POA: Diagnosis not present

## 2024-05-11 DIAGNOSIS — I480 Paroxysmal atrial fibrillation: Secondary | ICD-10-CM | POA: Diagnosis not present

## 2024-05-11 DIAGNOSIS — I2510 Atherosclerotic heart disease of native coronary artery without angina pectoris: Secondary | ICD-10-CM | POA: Diagnosis not present

## 2024-05-11 DIAGNOSIS — E7800 Pure hypercholesterolemia, unspecified: Secondary | ICD-10-CM | POA: Diagnosis not present

## 2024-05-11 DIAGNOSIS — I110 Hypertensive heart disease with heart failure: Secondary | ICD-10-CM | POA: Diagnosis not present

## 2024-05-11 DIAGNOSIS — I5033 Acute on chronic diastolic (congestive) heart failure: Secondary | ICD-10-CM | POA: Diagnosis not present

## 2024-05-16 DIAGNOSIS — I480 Paroxysmal atrial fibrillation: Secondary | ICD-10-CM | POA: Diagnosis not present

## 2024-05-16 DIAGNOSIS — I160 Hypertensive urgency: Secondary | ICD-10-CM | POA: Diagnosis not present

## 2024-05-16 DIAGNOSIS — I5033 Acute on chronic diastolic (congestive) heart failure: Secondary | ICD-10-CM | POA: Diagnosis not present

## 2024-05-16 DIAGNOSIS — E7800 Pure hypercholesterolemia, unspecified: Secondary | ICD-10-CM | POA: Diagnosis not present

## 2024-05-16 DIAGNOSIS — I2510 Atherosclerotic heart disease of native coronary artery without angina pectoris: Secondary | ICD-10-CM | POA: Diagnosis not present

## 2024-05-16 DIAGNOSIS — I110 Hypertensive heart disease with heart failure: Secondary | ICD-10-CM | POA: Diagnosis not present

## 2024-05-18 DIAGNOSIS — I110 Hypertensive heart disease with heart failure: Secondary | ICD-10-CM | POA: Diagnosis not present

## 2024-05-18 DIAGNOSIS — I2510 Atherosclerotic heart disease of native coronary artery without angina pectoris: Secondary | ICD-10-CM | POA: Diagnosis not present

## 2024-05-18 DIAGNOSIS — E7800 Pure hypercholesterolemia, unspecified: Secondary | ICD-10-CM | POA: Diagnosis not present

## 2024-05-18 DIAGNOSIS — I480 Paroxysmal atrial fibrillation: Secondary | ICD-10-CM | POA: Diagnosis not present

## 2024-05-18 DIAGNOSIS — I160 Hypertensive urgency: Secondary | ICD-10-CM | POA: Diagnosis not present

## 2024-05-18 DIAGNOSIS — I5033 Acute on chronic diastolic (congestive) heart failure: Secondary | ICD-10-CM | POA: Diagnosis not present

## 2024-05-19 DIAGNOSIS — Z79899 Other long term (current) drug therapy: Secondary | ICD-10-CM | POA: Diagnosis not present

## 2024-05-19 DIAGNOSIS — I161 Hypertensive emergency: Secondary | ICD-10-CM | POA: Diagnosis not present

## 2024-05-19 DIAGNOSIS — Z888 Allergy status to other drugs, medicaments and biological substances status: Secondary | ICD-10-CM | POA: Diagnosis not present

## 2024-05-19 DIAGNOSIS — D509 Iron deficiency anemia, unspecified: Secondary | ICD-10-CM | POA: Diagnosis present

## 2024-05-19 DIAGNOSIS — I5030 Unspecified diastolic (congestive) heart failure: Secondary | ICD-10-CM | POA: Diagnosis not present

## 2024-05-19 DIAGNOSIS — R0789 Other chest pain: Secondary | ICD-10-CM | POA: Diagnosis not present

## 2024-05-19 DIAGNOSIS — I5033 Acute on chronic diastolic (congestive) heart failure: Secondary | ICD-10-CM | POA: Diagnosis present

## 2024-05-19 DIAGNOSIS — Z66 Do not resuscitate: Secondary | ICD-10-CM | POA: Diagnosis present

## 2024-05-19 DIAGNOSIS — I110 Hypertensive heart disease with heart failure: Secondary | ICD-10-CM | POA: Diagnosis present

## 2024-05-19 DIAGNOSIS — E785 Hyperlipidemia, unspecified: Secondary | ICD-10-CM | POA: Diagnosis present

## 2024-05-19 DIAGNOSIS — I480 Paroxysmal atrial fibrillation: Secondary | ICD-10-CM | POA: Diagnosis not present

## 2024-05-19 DIAGNOSIS — E876 Hypokalemia: Secondary | ICD-10-CM | POA: Diagnosis present

## 2024-05-19 DIAGNOSIS — E039 Hypothyroidism, unspecified: Secondary | ICD-10-CM | POA: Diagnosis present

## 2024-05-19 DIAGNOSIS — I10 Essential (primary) hypertension: Secondary | ICD-10-CM | POA: Diagnosis not present

## 2024-05-19 DIAGNOSIS — K219 Gastro-esophageal reflux disease without esophagitis: Secondary | ICD-10-CM | POA: Diagnosis present

## 2024-05-19 DIAGNOSIS — I080 Rheumatic disorders of both mitral and aortic valves: Secondary | ICD-10-CM | POA: Diagnosis not present

## 2024-05-19 DIAGNOSIS — R0602 Shortness of breath: Secondary | ICD-10-CM | POA: Diagnosis not present

## 2024-05-19 DIAGNOSIS — R079 Chest pain, unspecified: Secondary | ICD-10-CM | POA: Diagnosis not present

## 2024-05-19 DIAGNOSIS — Z880 Allergy status to penicillin: Secondary | ICD-10-CM | POA: Diagnosis not present

## 2024-05-19 DIAGNOSIS — Z7901 Long term (current) use of anticoagulants: Secondary | ICD-10-CM | POA: Diagnosis not present

## 2024-05-19 DIAGNOSIS — Z882 Allergy status to sulfonamides status: Secondary | ICD-10-CM | POA: Diagnosis not present

## 2024-05-19 DIAGNOSIS — I2510 Atherosclerotic heart disease of native coronary artery without angina pectoris: Secondary | ICD-10-CM | POA: Diagnosis present

## 2024-05-25 DIAGNOSIS — I110 Hypertensive heart disease with heart failure: Secondary | ICD-10-CM | POA: Diagnosis not present

## 2024-05-25 DIAGNOSIS — I5033 Acute on chronic diastolic (congestive) heart failure: Secondary | ICD-10-CM | POA: Diagnosis not present

## 2024-05-25 DIAGNOSIS — I160 Hypertensive urgency: Secondary | ICD-10-CM | POA: Diagnosis not present

## 2024-05-25 DIAGNOSIS — I480 Paroxysmal atrial fibrillation: Secondary | ICD-10-CM | POA: Diagnosis not present

## 2024-05-25 DIAGNOSIS — I2510 Atherosclerotic heart disease of native coronary artery without angina pectoris: Secondary | ICD-10-CM | POA: Diagnosis not present

## 2024-05-25 DIAGNOSIS — E7800 Pure hypercholesterolemia, unspecified: Secondary | ICD-10-CM | POA: Diagnosis not present

## 2024-05-28 DIAGNOSIS — Z955 Presence of coronary angioplasty implant and graft: Secondary | ICD-10-CM | POA: Diagnosis not present

## 2024-05-28 DIAGNOSIS — Z7901 Long term (current) use of anticoagulants: Secondary | ICD-10-CM | POA: Diagnosis not present

## 2024-05-28 DIAGNOSIS — I2510 Atherosclerotic heart disease of native coronary artery without angina pectoris: Secondary | ICD-10-CM | POA: Diagnosis not present

## 2024-05-28 DIAGNOSIS — I480 Paroxysmal atrial fibrillation: Secondary | ICD-10-CM | POA: Diagnosis not present

## 2024-05-28 DIAGNOSIS — I5032 Chronic diastolic (congestive) heart failure: Secondary | ICD-10-CM | POA: Diagnosis not present

## 2024-05-31 DIAGNOSIS — I480 Paroxysmal atrial fibrillation: Secondary | ICD-10-CM | POA: Diagnosis not present

## 2024-05-31 DIAGNOSIS — I160 Hypertensive urgency: Secondary | ICD-10-CM | POA: Diagnosis not present

## 2024-05-31 DIAGNOSIS — E7800 Pure hypercholesterolemia, unspecified: Secondary | ICD-10-CM | POA: Diagnosis not present

## 2024-05-31 DIAGNOSIS — I2510 Atherosclerotic heart disease of native coronary artery without angina pectoris: Secondary | ICD-10-CM | POA: Diagnosis not present

## 2024-05-31 DIAGNOSIS — I110 Hypertensive heart disease with heart failure: Secondary | ICD-10-CM | POA: Diagnosis not present

## 2024-05-31 DIAGNOSIS — I5033 Acute on chronic diastolic (congestive) heart failure: Secondary | ICD-10-CM | POA: Diagnosis not present

## 2024-06-01 DIAGNOSIS — I5033 Acute on chronic diastolic (congestive) heart failure: Secondary | ICD-10-CM | POA: Diagnosis not present

## 2024-06-01 DIAGNOSIS — I160 Hypertensive urgency: Secondary | ICD-10-CM | POA: Diagnosis not present

## 2024-06-01 DIAGNOSIS — I110 Hypertensive heart disease with heart failure: Secondary | ICD-10-CM | POA: Diagnosis not present

## 2024-06-01 DIAGNOSIS — I2510 Atherosclerotic heart disease of native coronary artery without angina pectoris: Secondary | ICD-10-CM | POA: Diagnosis not present

## 2024-06-01 DIAGNOSIS — I480 Paroxysmal atrial fibrillation: Secondary | ICD-10-CM | POA: Diagnosis not present

## 2024-06-01 DIAGNOSIS — E7800 Pure hypercholesterolemia, unspecified: Secondary | ICD-10-CM | POA: Diagnosis not present

## 2024-06-02 DIAGNOSIS — K219 Gastro-esophageal reflux disease without esophagitis: Secondary | ICD-10-CM | POA: Diagnosis not present

## 2024-06-02 DIAGNOSIS — E7800 Pure hypercholesterolemia, unspecified: Secondary | ICD-10-CM | POA: Diagnosis not present

## 2024-06-02 DIAGNOSIS — I161 Hypertensive emergency: Secondary | ICD-10-CM | POA: Diagnosis not present

## 2024-06-02 DIAGNOSIS — I480 Paroxysmal atrial fibrillation: Secondary | ICD-10-CM | POA: Diagnosis not present

## 2024-06-02 DIAGNOSIS — Z955 Presence of coronary angioplasty implant and graft: Secondary | ICD-10-CM | POA: Diagnosis not present

## 2024-06-02 DIAGNOSIS — R7303 Prediabetes: Secondary | ICD-10-CM | POA: Diagnosis not present

## 2024-06-02 DIAGNOSIS — Z7901 Long term (current) use of anticoagulants: Secondary | ICD-10-CM | POA: Diagnosis not present

## 2024-06-02 DIAGNOSIS — E039 Hypothyroidism, unspecified: Secondary | ICD-10-CM | POA: Diagnosis not present

## 2024-06-02 DIAGNOSIS — D509 Iron deficiency anemia, unspecified: Secondary | ICD-10-CM | POA: Diagnosis not present

## 2024-06-02 DIAGNOSIS — Z9181 History of falling: Secondary | ICD-10-CM | POA: Diagnosis not present

## 2024-06-02 DIAGNOSIS — I5033 Acute on chronic diastolic (congestive) heart failure: Secondary | ICD-10-CM | POA: Diagnosis not present

## 2024-06-02 DIAGNOSIS — Z7902 Long term (current) use of antithrombotics/antiplatelets: Secondary | ICD-10-CM | POA: Diagnosis not present

## 2024-06-02 DIAGNOSIS — I2510 Atherosclerotic heart disease of native coronary artery without angina pectoris: Secondary | ICD-10-CM | POA: Diagnosis not present

## 2024-06-02 DIAGNOSIS — M810 Age-related osteoporosis without current pathological fracture: Secondary | ICD-10-CM | POA: Diagnosis not present

## 2024-06-02 DIAGNOSIS — I6522 Occlusion and stenosis of left carotid artery: Secondary | ICD-10-CM | POA: Diagnosis not present

## 2024-06-02 DIAGNOSIS — I110 Hypertensive heart disease with heart failure: Secondary | ICD-10-CM | POA: Diagnosis not present

## 2024-06-02 DIAGNOSIS — Z85118 Personal history of other malignant neoplasm of bronchus and lung: Secondary | ICD-10-CM | POA: Diagnosis not present

## 2024-06-05 DIAGNOSIS — I110 Hypertensive heart disease with heart failure: Secondary | ICD-10-CM | POA: Diagnosis not present

## 2024-06-05 DIAGNOSIS — I2510 Atherosclerotic heart disease of native coronary artery without angina pectoris: Secondary | ICD-10-CM | POA: Diagnosis not present

## 2024-06-05 DIAGNOSIS — I5033 Acute on chronic diastolic (congestive) heart failure: Secondary | ICD-10-CM | POA: Diagnosis not present

## 2024-06-05 DIAGNOSIS — I480 Paroxysmal atrial fibrillation: Secondary | ICD-10-CM | POA: Diagnosis not present

## 2024-06-05 DIAGNOSIS — E7800 Pure hypercholesterolemia, unspecified: Secondary | ICD-10-CM | POA: Diagnosis not present

## 2024-06-05 DIAGNOSIS — I161 Hypertensive emergency: Secondary | ICD-10-CM | POA: Diagnosis not present

## 2024-06-06 DIAGNOSIS — I2510 Atherosclerotic heart disease of native coronary artery without angina pectoris: Secondary | ICD-10-CM | POA: Diagnosis not present

## 2024-06-06 DIAGNOSIS — E7800 Pure hypercholesterolemia, unspecified: Secondary | ICD-10-CM | POA: Diagnosis not present

## 2024-06-06 DIAGNOSIS — I5032 Chronic diastolic (congestive) heart failure: Secondary | ICD-10-CM | POA: Diagnosis not present

## 2024-06-06 DIAGNOSIS — I480 Paroxysmal atrial fibrillation: Secondary | ICD-10-CM | POA: Diagnosis not present

## 2024-06-06 DIAGNOSIS — I110 Hypertensive heart disease with heart failure: Secondary | ICD-10-CM | POA: Diagnosis not present

## 2024-06-06 DIAGNOSIS — I161 Hypertensive emergency: Secondary | ICD-10-CM | POA: Diagnosis not present

## 2024-06-06 DIAGNOSIS — I5033 Acute on chronic diastolic (congestive) heart failure: Secondary | ICD-10-CM | POA: Diagnosis not present

## 2024-06-08 DIAGNOSIS — I110 Hypertensive heart disease with heart failure: Secondary | ICD-10-CM | POA: Diagnosis not present

## 2024-06-08 DIAGNOSIS — E7800 Pure hypercholesterolemia, unspecified: Secondary | ICD-10-CM | POA: Diagnosis not present

## 2024-06-08 DIAGNOSIS — I5033 Acute on chronic diastolic (congestive) heart failure: Secondary | ICD-10-CM | POA: Diagnosis not present

## 2024-06-08 DIAGNOSIS — I161 Hypertensive emergency: Secondary | ICD-10-CM | POA: Diagnosis not present

## 2024-06-08 DIAGNOSIS — I2510 Atherosclerotic heart disease of native coronary artery without angina pectoris: Secondary | ICD-10-CM | POA: Diagnosis not present

## 2024-06-08 DIAGNOSIS — I480 Paroxysmal atrial fibrillation: Secondary | ICD-10-CM | POA: Diagnosis not present

## 2024-06-13 DIAGNOSIS — I110 Hypertensive heart disease with heart failure: Secondary | ICD-10-CM | POA: Diagnosis not present

## 2024-06-13 DIAGNOSIS — I509 Heart failure, unspecified: Secondary | ICD-10-CM | POA: Diagnosis not present

## 2024-06-13 DIAGNOSIS — E7800 Pure hypercholesterolemia, unspecified: Secondary | ICD-10-CM | POA: Diagnosis not present

## 2024-06-13 DIAGNOSIS — I161 Hypertensive emergency: Secondary | ICD-10-CM | POA: Diagnosis not present

## 2024-06-13 DIAGNOSIS — I10 Essential (primary) hypertension: Secondary | ICD-10-CM | POA: Diagnosis not present

## 2024-06-13 DIAGNOSIS — Z2821 Immunization not carried out because of patient refusal: Secondary | ICD-10-CM | POA: Diagnosis not present

## 2024-06-13 DIAGNOSIS — I5033 Acute on chronic diastolic (congestive) heart failure: Secondary | ICD-10-CM | POA: Diagnosis not present

## 2024-06-13 DIAGNOSIS — I2510 Atherosclerotic heart disease of native coronary artery without angina pectoris: Secondary | ICD-10-CM | POA: Diagnosis not present

## 2024-06-13 DIAGNOSIS — I480 Paroxysmal atrial fibrillation: Secondary | ICD-10-CM | POA: Diagnosis not present

## 2024-06-14 DIAGNOSIS — I110 Hypertensive heart disease with heart failure: Secondary | ICD-10-CM | POA: Diagnosis not present

## 2024-06-14 DIAGNOSIS — I161 Hypertensive emergency: Secondary | ICD-10-CM | POA: Diagnosis not present

## 2024-06-14 DIAGNOSIS — I2510 Atherosclerotic heart disease of native coronary artery without angina pectoris: Secondary | ICD-10-CM | POA: Diagnosis not present

## 2024-06-14 DIAGNOSIS — I480 Paroxysmal atrial fibrillation: Secondary | ICD-10-CM | POA: Diagnosis not present

## 2024-06-14 DIAGNOSIS — I5033 Acute on chronic diastolic (congestive) heart failure: Secondary | ICD-10-CM | POA: Diagnosis not present

## 2024-06-14 DIAGNOSIS — E7800 Pure hypercholesterolemia, unspecified: Secondary | ICD-10-CM | POA: Diagnosis not present

## 2024-06-18 DIAGNOSIS — Z955 Presence of coronary angioplasty implant and graft: Secondary | ICD-10-CM | POA: Diagnosis not present

## 2024-06-18 DIAGNOSIS — I480 Paroxysmal atrial fibrillation: Secondary | ICD-10-CM | POA: Diagnosis not present

## 2024-06-18 DIAGNOSIS — Z7901 Long term (current) use of anticoagulants: Secondary | ICD-10-CM | POA: Diagnosis not present

## 2024-06-18 DIAGNOSIS — I2510 Atherosclerotic heart disease of native coronary artery without angina pectoris: Secondary | ICD-10-CM | POA: Diagnosis not present

## 2024-06-18 DIAGNOSIS — I5032 Chronic diastolic (congestive) heart failure: Secondary | ICD-10-CM | POA: Diagnosis not present

## 2024-06-19 DIAGNOSIS — I480 Paroxysmal atrial fibrillation: Secondary | ICD-10-CM | POA: Diagnosis not present

## 2024-06-19 DIAGNOSIS — I110 Hypertensive heart disease with heart failure: Secondary | ICD-10-CM | POA: Diagnosis not present

## 2024-06-19 DIAGNOSIS — I5033 Acute on chronic diastolic (congestive) heart failure: Secondary | ICD-10-CM | POA: Diagnosis not present

## 2024-06-19 DIAGNOSIS — I2510 Atherosclerotic heart disease of native coronary artery without angina pectoris: Secondary | ICD-10-CM | POA: Diagnosis not present

## 2024-06-19 DIAGNOSIS — I161 Hypertensive emergency: Secondary | ICD-10-CM | POA: Diagnosis not present

## 2024-06-19 DIAGNOSIS — E7800 Pure hypercholesterolemia, unspecified: Secondary | ICD-10-CM | POA: Diagnosis not present

## 2024-06-25 DIAGNOSIS — I5033 Acute on chronic diastolic (congestive) heart failure: Secondary | ICD-10-CM | POA: Diagnosis not present

## 2024-06-25 DIAGNOSIS — E7800 Pure hypercholesterolemia, unspecified: Secondary | ICD-10-CM | POA: Diagnosis not present

## 2024-06-25 DIAGNOSIS — I110 Hypertensive heart disease with heart failure: Secondary | ICD-10-CM | POA: Diagnosis not present

## 2024-06-25 DIAGNOSIS — I2510 Atherosclerotic heart disease of native coronary artery without angina pectoris: Secondary | ICD-10-CM | POA: Diagnosis not present

## 2024-06-25 DIAGNOSIS — I161 Hypertensive emergency: Secondary | ICD-10-CM | POA: Diagnosis not present

## 2024-06-25 DIAGNOSIS — I480 Paroxysmal atrial fibrillation: Secondary | ICD-10-CM | POA: Diagnosis not present

## 2024-06-26 DIAGNOSIS — I5033 Acute on chronic diastolic (congestive) heart failure: Secondary | ICD-10-CM | POA: Diagnosis not present

## 2024-06-26 DIAGNOSIS — E7800 Pure hypercholesterolemia, unspecified: Secondary | ICD-10-CM | POA: Diagnosis not present

## 2024-06-26 DIAGNOSIS — I161 Hypertensive emergency: Secondary | ICD-10-CM | POA: Diagnosis not present

## 2024-06-26 DIAGNOSIS — I110 Hypertensive heart disease with heart failure: Secondary | ICD-10-CM | POA: Diagnosis not present

## 2024-06-26 DIAGNOSIS — I480 Paroxysmal atrial fibrillation: Secondary | ICD-10-CM | POA: Diagnosis not present

## 2024-06-26 DIAGNOSIS — I2510 Atherosclerotic heart disease of native coronary artery without angina pectoris: Secondary | ICD-10-CM | POA: Diagnosis not present

## 2024-07-02 DIAGNOSIS — E7800 Pure hypercholesterolemia, unspecified: Secondary | ICD-10-CM | POA: Diagnosis not present

## 2024-07-02 DIAGNOSIS — D509 Iron deficiency anemia, unspecified: Secondary | ICD-10-CM | POA: Diagnosis not present

## 2024-07-02 DIAGNOSIS — Z7902 Long term (current) use of antithrombotics/antiplatelets: Secondary | ICD-10-CM | POA: Diagnosis not present

## 2024-07-02 DIAGNOSIS — M810 Age-related osteoporosis without current pathological fracture: Secondary | ICD-10-CM | POA: Diagnosis not present

## 2024-07-02 DIAGNOSIS — I2510 Atherosclerotic heart disease of native coronary artery without angina pectoris: Secondary | ICD-10-CM | POA: Diagnosis not present

## 2024-07-02 DIAGNOSIS — I110 Hypertensive heart disease with heart failure: Secondary | ICD-10-CM | POA: Diagnosis not present

## 2024-07-02 DIAGNOSIS — I5033 Acute on chronic diastolic (congestive) heart failure: Secondary | ICD-10-CM | POA: Diagnosis not present

## 2024-07-02 DIAGNOSIS — E039 Hypothyroidism, unspecified: Secondary | ICD-10-CM | POA: Diagnosis not present

## 2024-07-02 DIAGNOSIS — Z7901 Long term (current) use of anticoagulants: Secondary | ICD-10-CM | POA: Diagnosis not present

## 2024-07-02 DIAGNOSIS — I6522 Occlusion and stenosis of left carotid artery: Secondary | ICD-10-CM | POA: Diagnosis not present

## 2024-07-02 DIAGNOSIS — I480 Paroxysmal atrial fibrillation: Secondary | ICD-10-CM | POA: Diagnosis not present

## 2024-07-02 DIAGNOSIS — R7303 Prediabetes: Secondary | ICD-10-CM | POA: Diagnosis not present

## 2024-07-02 DIAGNOSIS — Z85118 Personal history of other malignant neoplasm of bronchus and lung: Secondary | ICD-10-CM | POA: Diagnosis not present

## 2024-07-02 DIAGNOSIS — Z955 Presence of coronary angioplasty implant and graft: Secondary | ICD-10-CM | POA: Diagnosis not present

## 2024-07-02 DIAGNOSIS — K219 Gastro-esophageal reflux disease without esophagitis: Secondary | ICD-10-CM | POA: Diagnosis not present

## 2024-07-02 DIAGNOSIS — Z9181 History of falling: Secondary | ICD-10-CM | POA: Diagnosis not present

## 2024-07-02 DIAGNOSIS — I161 Hypertensive emergency: Secondary | ICD-10-CM | POA: Diagnosis not present

## 2024-07-03 DIAGNOSIS — I110 Hypertensive heart disease with heart failure: Secondary | ICD-10-CM | POA: Diagnosis not present

## 2024-07-03 DIAGNOSIS — I161 Hypertensive emergency: Secondary | ICD-10-CM | POA: Diagnosis not present

## 2024-07-03 DIAGNOSIS — I2510 Atherosclerotic heart disease of native coronary artery without angina pectoris: Secondary | ICD-10-CM | POA: Diagnosis not present

## 2024-07-03 DIAGNOSIS — E7800 Pure hypercholesterolemia, unspecified: Secondary | ICD-10-CM | POA: Diagnosis not present

## 2024-07-03 DIAGNOSIS — I480 Paroxysmal atrial fibrillation: Secondary | ICD-10-CM | POA: Diagnosis not present

## 2024-07-03 DIAGNOSIS — I5033 Acute on chronic diastolic (congestive) heart failure: Secondary | ICD-10-CM | POA: Diagnosis not present

## 2024-07-04 DIAGNOSIS — I110 Hypertensive heart disease with heart failure: Secondary | ICD-10-CM | POA: Diagnosis not present

## 2024-07-04 DIAGNOSIS — I161 Hypertensive emergency: Secondary | ICD-10-CM | POA: Diagnosis not present

## 2024-07-04 DIAGNOSIS — E7800 Pure hypercholesterolemia, unspecified: Secondary | ICD-10-CM | POA: Diagnosis not present

## 2024-07-04 DIAGNOSIS — I2510 Atherosclerotic heart disease of native coronary artery without angina pectoris: Secondary | ICD-10-CM | POA: Diagnosis not present

## 2024-07-04 DIAGNOSIS — I480 Paroxysmal atrial fibrillation: Secondary | ICD-10-CM | POA: Diagnosis not present

## 2024-07-04 DIAGNOSIS — I5033 Acute on chronic diastolic (congestive) heart failure: Secondary | ICD-10-CM | POA: Diagnosis not present
